# Patient Record
Sex: Male | Born: 1950 | Race: White | Hispanic: No | Marital: Married | Smoking: Never smoker
Health system: Southern US, Community
[De-identification: ages and names within clinical notes are randomized; demographics above are authoritative.]

## PROBLEM LIST (undated history)

## (undated) DIAGNOSIS — E785 Hyperlipidemia, unspecified: Secondary | ICD-10-CM

## (undated) DIAGNOSIS — F411 Generalized anxiety disorder: Secondary | ICD-10-CM

## (undated) DIAGNOSIS — I10 Essential (primary) hypertension: Secondary | ICD-10-CM

## (undated) DIAGNOSIS — G4730 Sleep apnea, unspecified: Secondary | ICD-10-CM

## (undated) DIAGNOSIS — M1990 Unspecified osteoarthritis, unspecified site: Secondary | ICD-10-CM

## (undated) HISTORY — DX: Essential (primary) hypertension: I10

## (undated) HISTORY — PX: CATARACT EXTRACTION: SUR2

## (undated) HISTORY — DX: Hyperlipidemia, unspecified: E78.5

## (undated) HISTORY — DX: Generalized anxiety disorder: F41.1

## (undated) HISTORY — PX: HERNIA REPAIR: SHX51

## (undated) HISTORY — DX: Unspecified osteoarthritis, unspecified site: M19.90

## (undated) HISTORY — DX: Sleep apnea, unspecified: G47.30

## (undated) HISTORY — PX: POLYPECTOMY: SHX149

---

## 1998-07-05 ENCOUNTER — Ambulatory Visit (HOSPITAL_BASED_OUTPATIENT_CLINIC_OR_DEPARTMENT_OTHER): Admission: RE | Admit: 1998-07-05 | Discharge: 1998-07-05 | Payer: Self-pay | Admitting: *Deleted

## 2004-08-10 ENCOUNTER — Ambulatory Visit: Payer: Self-pay | Admitting: Internal Medicine

## 2004-08-28 ENCOUNTER — Ambulatory Visit: Payer: Self-pay | Admitting: Internal Medicine

## 2004-10-11 ENCOUNTER — Ambulatory Visit: Payer: Self-pay | Admitting: Internal Medicine

## 2005-02-02 ENCOUNTER — Ambulatory Visit: Payer: Self-pay | Admitting: Internal Medicine

## 2005-03-02 ENCOUNTER — Ambulatory Visit: Payer: Self-pay | Admitting: Internal Medicine

## 2005-05-09 ENCOUNTER — Ambulatory Visit: Payer: Self-pay | Admitting: Internal Medicine

## 2005-05-24 ENCOUNTER — Ambulatory Visit (HOSPITAL_BASED_OUTPATIENT_CLINIC_OR_DEPARTMENT_OTHER): Admission: RE | Admit: 2005-05-24 | Discharge: 2005-05-24 | Payer: Self-pay | Admitting: Internal Medicine

## 2005-05-26 ENCOUNTER — Ambulatory Visit: Payer: Self-pay | Admitting: Pulmonary Disease

## 2005-07-10 ENCOUNTER — Ambulatory Visit: Payer: Self-pay | Admitting: Internal Medicine

## 2005-08-15 ENCOUNTER — Ambulatory Visit: Payer: Self-pay | Admitting: Internal Medicine

## 2005-09-18 ENCOUNTER — Ambulatory Visit: Payer: Self-pay | Admitting: Internal Medicine

## 2005-10-16 ENCOUNTER — Ambulatory Visit: Payer: Self-pay | Admitting: Internal Medicine

## 2005-12-21 ENCOUNTER — Ambulatory Visit: Payer: Self-pay | Admitting: Internal Medicine

## 2006-01-07 ENCOUNTER — Ambulatory Visit (HOSPITAL_COMMUNITY): Admission: RE | Admit: 2006-01-07 | Discharge: 2006-01-07 | Payer: Self-pay | Admitting: Orthopedic Surgery

## 2006-02-27 ENCOUNTER — Ambulatory Visit: Payer: Self-pay | Admitting: Internal Medicine

## 2006-02-27 LAB — CONVERTED CEMR LAB
AST: 20 units/L (ref 0–37)
Albumin: 4 g/dL (ref 3.5–5.2)
Basophils Absolute: 0 10*3/uL (ref 0.0–0.1)
Chloride: 106 meq/L (ref 96–112)
Creatinine, Ser: 1.2 mg/dL (ref 0.4–1.5)
GFR calc non Af Amer: 66 mL/min
Glomerular Filtration Rate, Af Am: 80 mL/min/{1.73_m2}
Glucose, Bld: 92 mg/dL (ref 70–99)
HDL: 36.9 mg/dL — ABNORMAL LOW (ref >39.0)
LDL Cholesterol: 144 mg/dL — ABNORMAL HIGH (ref 0–99)
MCHC: 33.9 g/dL (ref 30.0–36.0)
MCV: 96.6 fL (ref 78.0–100.0)
Monocytes Absolute: 0.5 10*3/uL (ref 0.2–0.7)
Monocytes Relative: 8.9 % (ref 3.0–11.0)
Neutro Abs: 3.7 10*3/uL (ref 1.4–7.7)
PSA: 2.32 ng/mL (ref 0.10–4.00)
Platelets: 292 10*3/uL (ref 150–400)
RBC: 4.62 M/uL (ref 4.22–5.81)
RDW: 12.6 % (ref 11.5–14.6)
Sodium: 139 meq/L (ref 135–145)
Total Bilirubin: 0.8 mg/dL (ref 0.3–1.2)
Triglyceride fasting, serum: 84 mg/dL (ref 0–149)
VLDL: 17 mg/dL (ref 0–40)

## 2006-03-06 ENCOUNTER — Ambulatory Visit: Payer: Self-pay | Admitting: Internal Medicine

## 2006-03-26 HISTORY — PX: KNEE ARTHROSCOPY: SUR90

## 2006-03-29 ENCOUNTER — Ambulatory Visit: Payer: Self-pay | Admitting: Gastroenterology

## 2006-04-12 ENCOUNTER — Ambulatory Visit: Payer: Self-pay | Admitting: Gastroenterology

## 2006-05-15 ENCOUNTER — Ambulatory Visit: Payer: Self-pay | Admitting: Internal Medicine

## 2006-10-03 ENCOUNTER — Ambulatory Visit: Payer: Self-pay | Admitting: Internal Medicine

## 2006-11-07 DIAGNOSIS — M1990 Unspecified osteoarthritis, unspecified site: Secondary | ICD-10-CM

## 2006-11-07 DIAGNOSIS — M1711 Unilateral primary osteoarthritis, right knee: Secondary | ICD-10-CM

## 2006-11-07 DIAGNOSIS — I10 Essential (primary) hypertension: Secondary | ICD-10-CM

## 2006-11-07 DIAGNOSIS — G4700 Insomnia, unspecified: Secondary | ICD-10-CM

## 2006-11-07 DIAGNOSIS — F411 Generalized anxiety disorder: Secondary | ICD-10-CM

## 2006-11-07 HISTORY — DX: Essential (primary) hypertension: I10

## 2006-11-07 HISTORY — DX: Generalized anxiety disorder: F41.1

## 2006-11-07 HISTORY — DX: Unspecified osteoarthritis, unspecified site: M19.90

## 2006-12-26 ENCOUNTER — Telehealth: Payer: Self-pay | Admitting: *Deleted

## 2007-03-28 ENCOUNTER — Ambulatory Visit: Payer: Self-pay | Admitting: Internal Medicine

## 2007-03-28 LAB — CONVERTED CEMR LAB
ALT: 32 units/L (ref 0–53)
AST: 26 units/L (ref 0–37)
Basophils Relative: 0.8 % (ref 0.0–1.0)
Bilirubin, Direct: 0.2 mg/dL (ref 0.0–0.3)
CO2: 28 meq/L (ref 19–32)
Calcium: 9.1 mg/dL (ref 8.4–10.5)
Chloride: 105 meq/L (ref 96–112)
Eosinophils Relative: 3.9 % (ref 0.0–5.0)
Glucose, Bld: 81 mg/dL (ref 70–99)
HCT: 46.2 % (ref 39.0–52.0)
HDL: 34.6 mg/dL — ABNORMAL LOW (ref >39.0)
Lymphocytes Relative: 22.8 % (ref 12.0–46.0)
Neutro Abs: 4.8 10*3/uL (ref 1.4–7.7)
Neutrophils Relative %: 63.6 % (ref 43.0–77.0)
Nitrite: NEGATIVE
Platelets: 266 10*3/uL (ref 150–400)
Protein, U semiquant: NEGATIVE
RBC: 4.73 M/uL (ref 4.22–5.81)
Total Protein: 7 g/dL (ref 6.0–8.3)
Urobilinogen, UA: 0.2
VLDL: 30 mg/dL (ref 0–40)
WBC Urine, dipstick: NEGATIVE
WBC: 7.6 10*3/uL (ref 4.5–10.5)

## 2007-04-04 ENCOUNTER — Ambulatory Visit: Payer: Self-pay | Admitting: Internal Medicine

## 2007-07-31 ENCOUNTER — Ambulatory Visit: Payer: Self-pay | Admitting: Internal Medicine

## 2007-07-31 DIAGNOSIS — E785 Hyperlipidemia, unspecified: Secondary | ICD-10-CM

## 2007-07-31 HISTORY — DX: Hyperlipidemia, unspecified: E78.5

## 2008-03-15 ENCOUNTER — Ambulatory Visit: Payer: Self-pay | Admitting: Internal Medicine

## 2008-03-15 LAB — CONVERTED CEMR LAB
ALT: 22 units/L (ref 0–53)
AST: 17 units/L (ref 0–37)
Basophils Absolute: 0 10*3/uL (ref 0.0–0.1)
Basophils Relative: 0.1 % (ref 0.0–3.0)
Blood in Urine, dipstick: NEGATIVE
CO2: 29 meq/L (ref 19–32)
Chloride: 104 meq/L (ref 96–112)
Creatinine, Ser: 1 mg/dL (ref 0.4–1.5)
Direct LDL: 151.7 mg/dL
Eosinophils Relative: 1 % (ref 0.0–5.0)
HDL: 36.9 mg/dL — ABNORMAL LOW (ref >39.0)
Lymphocytes Relative: 8.5 % — ABNORMAL LOW (ref 12.0–46.0)
MCHC: 35 g/dL (ref 30.0–36.0)
Neutrophils Relative %: 84.5 % — ABNORMAL HIGH (ref 43.0–77.0)
Nitrite: NEGATIVE
Protein, U semiquant: NEGATIVE
RBC: 4.7 M/uL (ref 4.22–5.81)
Total Bilirubin: 0.9 mg/dL (ref 0.3–1.2)
Total CHOL/HDL Ratio: 5.6
Urobilinogen, UA: 0.2
VLDL: 20 mg/dL (ref 0–40)
WBC Urine, dipstick: NEGATIVE
WBC: 11.3 10*3/uL — ABNORMAL HIGH (ref 4.5–10.5)

## 2008-03-17 ENCOUNTER — Ambulatory Visit: Payer: Self-pay | Admitting: Internal Medicine

## 2008-05-03 ENCOUNTER — Ambulatory Visit: Payer: Self-pay | Admitting: Internal Medicine

## 2008-05-03 LAB — CONVERTED CEMR LAB
Blood in Urine, dipstick: NEGATIVE
Urobilinogen, UA: 1

## 2008-05-04 ENCOUNTER — Encounter: Payer: Self-pay | Admitting: Internal Medicine

## 2008-07-07 ENCOUNTER — Ambulatory Visit: Payer: Self-pay | Admitting: Internal Medicine

## 2008-07-07 LAB — CONVERTED CEMR LAB
Albumin: 3.8 g/dL (ref 3.5–5.2)
Cholesterol: 184 mg/dL (ref 0–200)
HDL: 34.5 mg/dL — ABNORMAL LOW (ref >39.00)
LDL Cholesterol: 137 mg/dL — ABNORMAL HIGH (ref 0–99)
Total Protein: 7.3 g/dL (ref 6.0–8.3)
Triglycerides: 65 mg/dL (ref 0.0–149.0)
VLDL: 13 mg/dL (ref 0.0–40.0)

## 2008-07-14 ENCOUNTER — Ambulatory Visit: Payer: Self-pay | Admitting: Internal Medicine

## 2008-07-14 DIAGNOSIS — M65849 Other synovitis and tenosynovitis, unspecified hand: Secondary | ICD-10-CM

## 2008-07-14 DIAGNOSIS — M65839 Other synovitis and tenosynovitis, unspecified forearm: Secondary | ICD-10-CM

## 2008-07-14 LAB — CONVERTED CEMR LAB
HDL goal, serum: 40 mg/dL
LDL Goal: 130 mg/dL

## 2008-11-30 ENCOUNTER — Ambulatory Visit: Payer: Self-pay | Admitting: Internal Medicine

## 2008-11-30 LAB — CONVERTED CEMR LAB
ALT: 20 units/L (ref 0–53)
AST: 21 units/L (ref 0–37)
Bilirubin, Direct: 0 mg/dL (ref 0.0–0.3)
Total Bilirubin: 0.9 mg/dL (ref 0.3–1.2)
Total CHOL/HDL Ratio: 5

## 2008-12-07 ENCOUNTER — Ambulatory Visit: Payer: Self-pay | Admitting: Internal Medicine

## 2009-04-19 ENCOUNTER — Ambulatory Visit: Payer: Self-pay | Admitting: Internal Medicine

## 2009-04-19 LAB — CONVERTED CEMR LAB
Albumin: 4 g/dL (ref 3.5–5.2)
Basophils Relative: 0.5 % (ref 0.0–3.0)
Bilirubin Urine: NEGATIVE
Blood in Urine, dipstick: NEGATIVE
CO2: 28 meq/L (ref 19–32)
Chloride: 107 meq/L (ref 96–112)
Cholesterol: 183 mg/dL (ref 0–200)
Creatinine, Ser: 1.1 mg/dL (ref 0.4–1.5)
Eosinophils Absolute: 0.2 10*3/uL (ref 0.0–0.7)
HDL: 36.6 mg/dL — ABNORMAL LOW (ref >39.00)
Hemoglobin: 16.1 g/dL (ref 13.0–17.0)
LDL Cholesterol: 127 mg/dL — ABNORMAL HIGH (ref 0–99)
MCHC: 33.5 g/dL (ref 30.0–36.0)
MCV: 99 fL (ref 78.0–100.0)
Monocytes Absolute: 0.5 10*3/uL (ref 0.1–1.0)
Neutro Abs: 4.2 10*3/uL (ref 1.4–7.7)
PSA: 1.52 ng/mL (ref 0.10–4.00)
Protein, U semiquant: NEGATIVE
RBC: 4.87 M/uL (ref 4.22–5.81)
Sodium: 140 meq/L (ref 135–145)
Total Protein: 7.3 g/dL (ref 6.0–8.3)
Urobilinogen, UA: 0.2
VLDL: 19.2 mg/dL (ref 0.0–40.0)
WBC Urine, dipstick: NEGATIVE

## 2009-04-26 ENCOUNTER — Ambulatory Visit: Payer: Self-pay | Admitting: Internal Medicine

## 2009-07-27 ENCOUNTER — Telehealth: Payer: Self-pay | Admitting: Internal Medicine

## 2009-08-02 ENCOUNTER — Ambulatory Visit: Payer: Self-pay | Admitting: Internal Medicine

## 2009-10-18 ENCOUNTER — Ambulatory Visit: Payer: Self-pay | Admitting: Internal Medicine

## 2009-10-18 LAB — CONVERTED CEMR LAB
ALT: 16 units/L (ref 0–53)
Direct LDL: 154.1 mg/dL
HDL: 36.8 mg/dL — ABNORMAL LOW (ref >39.00)
Total Bilirubin: 1 mg/dL (ref 0.3–1.2)

## 2010-03-01 ENCOUNTER — Ambulatory Visit: Payer: Self-pay | Admitting: Internal Medicine

## 2010-03-02 ENCOUNTER — Encounter: Payer: Self-pay | Admitting: Internal Medicine

## 2010-04-18 ENCOUNTER — Encounter: Payer: Self-pay | Admitting: *Deleted

## 2010-04-18 ENCOUNTER — Ambulatory Visit
Admission: RE | Admit: 2010-04-18 | Discharge: 2010-04-18 | Payer: Self-pay | Source: Home / Self Care | Attending: Internal Medicine | Admitting: Internal Medicine

## 2010-04-18 ENCOUNTER — Other Ambulatory Visit: Payer: Self-pay | Admitting: Internal Medicine

## 2010-04-18 DIAGNOSIS — K4090 Unilateral inguinal hernia, without obstruction or gangrene, not specified as recurrent: Secondary | ICD-10-CM

## 2010-04-18 LAB — CBC WITH DIFFERENTIAL/PLATELET
Basophils Relative: 0.8 % (ref 0.0–3.0)
Eosinophils Relative: 2.7 % (ref 0.0–5.0)
HCT: 48.8 % (ref 39.0–52.0)
Hemoglobin: 16.6 g/dL (ref 13.0–17.0)
Lymphs Abs: 1.9 10*3/uL (ref 0.7–4.0)
MCV: 99.3 fl (ref 78.0–100.0)
Monocytes Absolute: 0.6 10*3/uL (ref 0.1–1.0)
Monocytes Relative: 8.7 % (ref 3.0–12.0)
RBC: 4.91 Mil/uL (ref 4.22–5.81)
WBC: 7 10*3/uL (ref 4.5–10.5)

## 2010-04-18 LAB — TSH: TSH: 1.49 u[IU]/mL (ref 0.35–5.50)

## 2010-04-18 LAB — HEPATIC FUNCTION PANEL
ALT: 25 U/L (ref 0–53)
AST: 22 U/L (ref 0–37)
Bilirubin, Direct: 0.2 mg/dL (ref 0.0–0.3)
Total Bilirubin: 1 mg/dL (ref 0.3–1.2)
Total Protein: 7.9 g/dL (ref 6.0–8.3)

## 2010-04-18 LAB — LIPID PANEL
HDL: 36.6 mg/dL — ABNORMAL LOW (ref >39.00)
Total CHOL/HDL Ratio: 6
VLDL: 18 mg/dL (ref 0.0–40.0)

## 2010-04-18 LAB — BASIC METABOLIC PANEL
Chloride: 104 mEq/L (ref 96–112)
GFR: 73.77 mL/min (ref >60.00)
Potassium: 4.8 mEq/L (ref 3.5–5.1)
Sodium: 140 mEq/L (ref 135–145)

## 2010-04-18 LAB — PSA: PSA: 1.39 ng/mL (ref 0.10–4.00)

## 2010-04-18 NOTE — Assessment & Plan Note (Signed)
Referral to general surgery made today

## 2010-04-18 NOTE — Progress Notes (Signed)
Subjective:     Patient ID: Nathan Young is a 60 y.o. male.  HPI Nathan Young is a 60 year old white male who presents with a one-month history of right inguinal pain. He states that he lifts heavy objects at work and noted pain occurring in his right inguinal area or vomitingabout one month ago. He's had persistent pain in the area with associated nausea and radiation to his back. He states the pain is similar to the pain he experienced when he had a left inguinal hernia in 2001 that hernia required surgical repair by Dr. Lorelee New. He does not complaining of constipation. He is able to pass flatus. He has no fever chills  The following portions of the patient's history were reviewed and updated as appropriate: allergies, current medications, past family history, past medical history, past social history, past surgical history and problem list.  Review of Systems  Constitutional: Negative for fever and fatigue.  HENT: Negative for hearing loss, congestion, neck pain and postnasal drip.   Eyes: Negative for discharge, redness and visual disturbance.  Respiratory: Negative for cough, shortness of breath and wheezing.   Cardiovascular: Negative for leg swelling.  Gastrointestinal: Negative for abdominal pain, constipation and abdominal distention.  Genitourinary: Negative for urgency and frequency.  Musculoskeletal: Negative for joint swelling and arthralgias.  Skin: Negative for color change and rash.  Neurological: Negative for weakness and light-headedness.  Hematological: Negative for adenopathy.  Psychiatric/Behavioral: Negative for behavioral problems.       Objective:   Physical Exam  Constitutional: He is oriented to person, place, and time. He appears well-developed and well-nourished. He appears distressed.  HENT:  Head: Normocephalic.  Right Ear: External ear normal.  Left Ear: External ear normal.  Eyes: EOM are normal. Pupils are equal, round, and reactive to light.   Neck: Normal range of motion.  Cardiovascular: Normal rate and regular rhythm.   Pulmonary/Chest: Effort normal and breath sounds normal.  Abdominal: Soft. He exhibits mass.       Has tenderness in the right inguinal area with palpable mass in canal that is easily reduced  Musculoskeletal: Normal range of motion.  Neurological: He is alert and oriented to person, place, and time.  Skin: Skin is warm and dry.  Psychiatric: He has a normal mood and affect. His behavior is normal.       Assessment:     New right in direct inguinal hernia. History of left inguinal hernia. Stable hypertension.    Plan:       the patient is referred today to Gen. Surgery. He is instructed to avoid lifting weights greater than 10 pounds. He feels that he does not require anything for pain at this time. He is instructed that should he develop constipation persistent pain nausea or vomiting he should present to the emergency room for evaluation. Patient demonstrated knowledge of his condition and understanding of these instructions.

## 2010-04-21 ENCOUNTER — Ambulatory Visit: Admit: 2010-04-21 | Payer: Self-pay | Admitting: Internal Medicine

## 2010-04-25 NOTE — Letter (Signed)
Summary: SHINGLE SHOT WAIVER  SHINGLE SHOT WAIVER   Imported By: Georgian Co 03/02/2010 09:38:41  _____________________________________________________________________  External Attachment:    Type:   Image     Comment:   External Document

## 2010-04-25 NOTE — Assessment & Plan Note (Signed)
Summary: cpx/njr rsc bmp/njr   Vital Signs:  Patient profile:   60 year old male Height:      71 inches Weight:      190 pounds BMI:     26.60 Temp:     98.2 degrees F oral Pulse rate:   72 / minute Resp:     14 per minute BP sitting:   124 / 76  (left arm)  Vitals Entered By: Willy Eddy, LPN (April 26, 2009 11:06 AM) CC: cpx   CC:  cpx.  History of Present Illness: weight increased with less activity  The pt was asked about all immunizations, health maint. services that are appropriate to their age and was given guidance on diet exercize  and weight management   Preventive Screening-Counseling & Management  Alcohol-Tobacco     Smoking Status: never  Problems Prior to Update: 1)  Tendinitis, Left Thumb  (ICD-727.05) 2)  Uti  (ICD-599.0) 3)  Hyperlipidemia  (ICD-272.4) 4)  Physical Examination  (ICD-V70.0) 5)  Anxiety  (ICD-300.00) 6)  Osteoarthritis  (ICD-715.90) 7)  Hypertension  (ICD-401.9)  Current Problems (verified): 1)  Tendinitis, Left Thumb  (ICD-727.05) 2)  Uti  (ICD-599.0) 3)  Hyperlipidemia  (ICD-272.4) 4)  Physical Examination  (ICD-V70.0) 5)  Anxiety  (ICD-300.00) 6)  Osteoarthritis  (ICD-715.90) 7)  Hypertension  (ICD-401.9)  Medications Prior to Update: 1)  Atenolol 100 Mg Tabs (Atenolol) .... 1/2 Once Daily 2)  Diazepam 5 Mg  Tabs (Diazepam) .... 1/2  By Mouth At Bedtime 3)  Seroquel 50 Mg Tabs (Quetiapine Fumarate) .... One By Mouth Q Hs 4)  Cialis 2.5 Mg  Tabs (Tadalafil) .... As Needed 5)  Fish Oil Maximum Strength 1200 Mg Caps (Omega-3 Fatty Acids) .... One By Mouth Bid 6)  Niacin Flush Free 500 Mg Caps (Inositol Niacinate) .... One By Mouth Daily  Current Medications (verified): 1)  Atenolol 100 Mg Tabs (Atenolol) .... 1/2 Once Daily 2)  Diazepam 5 Mg  Tabs (Diazepam) .... 1/2  By Mouth At Bedtime 3)  Seroquel 50 Mg Tabs (Quetiapine Fumarate) .... 1/2 At Bedtime ' 4)  Cialis 2.5 Mg  Tabs (Tadalafil) .... As Needed 5)  Fish  Oil Maximum Strength 1200 Mg Caps (Omega-3 Fatty Acids) .... One By Mouth Bid 6)  Niacin Flush Free 500 Mg Caps (Inositol Niacinate) .... One By Mouth Daily  Allergies (verified): No Known Drug Allergies  Past History:  Social History: Last updated: 11/07/2006 Occupation: Married Former Smoker Alcohol use-no Drug use-no  Risk Factors: Smoking Status: never (04/26/2009)  Past medical, surgical, family and social histories (including risk factors) reviewed, and no changes noted (except as noted below).  Past Medical History: Reviewed history from 07/31/2007 and no changes required. Sleep Apnea Hypertension Insomnia Osteoarthritis Anxiety Hyperlipidemia  Past Surgical History: Reviewed history from 11/07/2006 and no changes required. Colonoscopy  Family History: Reviewed history and no changes required.  Social History: Reviewed history from 11/07/2006 and no changes required. Occupation: Married Former Smoker Alcohol use-no Drug use-no Smoking Status:  never  Review of Systems  The patient denies anorexia, fever, weight loss, weight gain, vision loss, decreased hearing, hoarseness, chest pain, syncope, dyspnea on exertion, peripheral edema, prolonged cough, headaches, hemoptysis, abdominal pain, melena, hematochezia, severe indigestion/heartburn, hematuria, incontinence, genital sores, muscle weakness, suspicious skin lesions, transient blindness, difficulty walking, depression, unusual weight change, abnormal bleeding, enlarged lymph nodes, angioedema, and breast masses.    Physical Exam  General:  Well-developed,well-nourished,in no acute distress; alert,appropriate and cooperative  throughout examination Head:  normocephalic and atraumatic.   Eyes:  pupils equal and pupils round.   Nose:  External nasal examination shows no deformity or inflammation. Nasal mucosa are pink and moist without lesions or exudates. Mouth:  Oral mucosa and oropharynx without lesions  or exudates.  Teeth in good repair. Neck:  No deformities, masses, or tenderness noted. Lungs:  Normal respiratory effort, chest expands symmetrically. Lungs are clear to auscultation, no crackles or wheezes. Heart:  Normal rate and regular rhythm. S1 and S2 normal without gallop, murmur, click, rub or other extra sounds. Abdomen:  Bowel sounds positive,abdomen soft and non-tender without masses, organomegaly or hernias noted. Msk:  No deformity or scoliosis noted of thoracic or lumbar spine.   Extremities:  No clubbing, cyanosis, edema, or deformity noted with normal full range of motion of all joints.   Neurologic:  No cranial nerve deficits noted. Station and gait are normal. Plantar reflexes are down-going bilaterally. DTRs are symmetrical throughout. Sensory, motor and coordinative functions appear intact.   Impression & Recommendations:  Problem # 1:  PHYSICAL EXAMINATION (ICD-V70.0) The pt was asked about all immunizations, health maint. services that are appropriate to their age and was given guidance on diet exercize  and weight management  Colonoscopy: Normal (04/28/2007) Td Booster: Historical (03/26/2000)   Flu Vax: Historical (01/27/2009)   Chol: 183 (04/19/2009)   HDL: 36.60 (04/19/2009)   LDL: 127 (04/19/2009)   TG: 96.0 (04/19/2009) TSH: 1.97 (04/19/2009)   PSA: 1.52 (04/19/2009) Next Colonoscopy due:: 04/2016 (04/26/2009)  Discussed using sunscreen, use of alcohol, drug use, self testicular exam, routine dental care, routine eye care, routine physical exam, seat belts, multiple vitamins, osteoporosis prevention, adequate calcium intake in diet, and recommendations for immunizations.  Discussed exercise and checking cholesterol.  Discussed gun safety, safe sex, and contraception. Also recommend checking PSA.  Problem # 2:  HYPERTENSION (ICD-401.9)  His updated medication list for this problem includes:    Atenolol 100 Mg Tabs (Atenolol) .Marland Kitchen... 1/2 once daily  BP today:  124/76 Prior BP: 116/74 (12/07/2008)  Prior 10 Yr Risk Heart Disease: 18 % (12/07/2008)  Labs Reviewed: K+: 4.6 (04/19/2009) Creat: : 1.1 (04/19/2009)   Chol: 183 (04/19/2009)   HDL: 36.60 (04/19/2009)   LDL: 127 (04/19/2009)   TG: 96.0 (04/19/2009)  Complete Medication List: 1)  Atenolol 100 Mg Tabs (Atenolol) .... 1/2 once daily 2)  Diazepam 5 Mg Tabs (Diazepam) .... 1/2  by mouth at bedtime 3)  Seroquel 50 Mg Tabs (Quetiapine fumarate) .... 1/2 at bedtime ' 4)  Cialis 2.5 Mg Tabs (Tadalafil) .... As needed 5)  Fish Oil Maximum Strength 1200 Mg Caps (Omega-3 fatty acids) .... One by mouth bid 6)  Niacin Flush Free 500 Mg Caps (Inositol niacinate) .... One by mouth daily  Patient Instructions: 1)  Please schedule a follow-up appointment in 6 months. Prescriptions: SEROQUEL 50 MG TABS (QUETIAPINE FUMARATE) 1/2 at bedtime '  #90 x 3   Entered and Authorized by:   Stacie Glaze MD   Signed by:   Stacie Glaze MD on 04/26/2009   Method used:   Electronically to        [facility]* (mail-order)             ,          Ph: [phone]       Fax: [phone]   RxID:   [id number] ATENOLOL 100 MG TABS (ATENOLOL) 1/2 once daily  #90 x 3  Entered and Authorized by:   Stacie Glaze MD   Signed by:   Stacie Glaze MD on 04/26/2009   Method used:   Electronically to        [facility]* (mail-order)             ,          Ph: [phone]       Fax: [phone]   RxID:   [id number]    Immunization History:  Influenza Immunization History:    Influenza:  historical (01/27/2009)    Preventive Care Screening  Colonoscopy:    Next Due:  04/2016

## 2010-04-25 NOTE — Assessment & Plan Note (Signed)
Summary: HTN CONCERNS // RS   Vital Signs:  Patient profile:   60 year old male Height:      71 inches Weight:      185 pounds BMI:     25.90 Temp:     98.2 degrees F oral Pulse rate:   76 / minute Pulse rhythm:   regular Resp:     14 per minute BP sitting:   120 / 80  (left arm)  Vitals Entered By: Willy Eddy, LPN (Aug 02, 2009 10:05 AM) CC: c/o elevated bp reading at various places,primarily elevated diastolic   CC:  c/o elevated bp reading at various places and primarily elevated diastolic.  History of Present Illness:  Hypertension Follow-Up      This is a 60 year old man who presents for Hypertension follow-up.  The patient denies lightheadedness, urinary frequency, headaches, edema, impotence, rash, and fatigue.  Associated symptoms include chest pain.  The patient denies the following associated symptoms: chest pressure, exercise intolerance, dyspnea, palpitations, syncope, leg edema, and pedal edema.  Compliance with medications (by patient report) has been near 100%.  The patient reports that dietary compliance has been fair.  The patient reports exercising occasionally.    Preventive Screening-Counseling & Management  Alcohol-Tobacco     Smoking Status: never  Problems Prior to Update: 1)  Tendinitis, Left Thumb  (ICD-727.05) 2)  Uti  (ICD-599.0) 3)  Hyperlipidemia  (ICD-272.4) 4)  Physical Examination  (ICD-V70.0) 5)  Anxiety  (ICD-300.00) 6)  Osteoarthritis  (ICD-715.90) 7)  Hypertension  (ICD-401.9)  Current Problems (verified): 1)  Tendinitis, Left Thumb  (ICD-727.05) 2)  Uti  (ICD-599.0) 3)  Hyperlipidemia  (ICD-272.4) 4)  Physical Examination  (ICD-V70.0) 5)  Anxiety  (ICD-300.00) 6)  Osteoarthritis  (ICD-715.90) 7)  Hypertension  (ICD-401.9)  Medications Prior to Update: 1)  Atenolol 100 Mg Tabs (Atenolol) .... 1/2 Once Daily 2)  Diazepam 5 Mg  Tabs (Diazepam) .... 1/2  By Mouth At Bedtime 3)  Seroquel 50 Mg Tabs (Quetiapine Fumarate) ....  1/2 At Bedtime ' 4)  Cialis 2.5 Mg  Tabs (Tadalafil) .... As Needed 5)  Fish Oil Maximum Strength 1200 Mg Caps (Omega-3 Fatty Acids) .... One By Mouth Bid 6)  Niacin Flush Free 500 Mg Caps (Inositol Niacinate) .... One By Mouth Daily  Current Medications (verified): 1)  Atenolol 100 Mg Tabs (Atenolol) .... 1/2 Once Daily 2)  Diazepam 5 Mg  Tabs (Diazepam) .... 1/2  By Mouth At Bedtime 3)  Seroquel 50 Mg Tabs (Quetiapine Fumarate) .... 1/2 At Bedtime ' 4)  Cialis 2.5 Mg  Tabs (Tadalafil) .... As Needed 5)  Fish Oil Maximum Strength 1200 Mg Caps (Omega-3 Fatty Acids) .... One By Mouth Bid 6)  Niacin Flush Free 500 Mg Caps (Inositol Niacinate) .... One By Mouth Daily  Allergies (verified): No Known Drug Allergies  Past History:  Social History: Last updated: 11/07/2006 Occupation: Married Former Smoker Alcohol use-no Drug use-no  Risk Factors: Smoking Status: never (08/02/2009)  Past medical, surgical, family and social histories (including risk factors) reviewed, and no changes noted (except as noted below).  Past Medical History: Reviewed history from 07/31/2007 and no changes required. Sleep Apnea Hypertension Insomnia Osteoarthritis Anxiety Hyperlipidemia  Past Surgical History: Reviewed history from 11/07/2006 and no changes required. Colonoscopy  Family History: Reviewed history and no changes required.  Social History: Reviewed history from 11/07/2006 and no changes required. Occupation: Married Former Smoker Alcohol use-no Drug use-no  Review of Systems  The  patient denies anorexia, fever, weight loss, weight gain, vision loss, decreased hearing, hoarseness, chest pain, syncope, dyspnea on exertion, peripheral edema, prolonged cough, headaches, hemoptysis, abdominal pain, melena, hematochezia, severe indigestion/heartburn, hematuria, incontinence, genital sores, muscle weakness, suspicious skin lesions, transient blindness, difficulty walking, depression,  unusual weight change, abnormal bleeding, enlarged lymph nodes, angioedema, and breast masses.    Physical Exam  General:  Well-developed,well-nourished,in no acute distress; alert,appropriate and cooperative throughout examination Head:  normocephalic and atraumatic.   Eyes:  pupils equal and pupils round.   Nose:  External nasal examination shows no deformity or inflammation. Nasal mucosa are pink and moist without lesions or exudates. Mouth:  Oral mucosa and oropharynx without lesions or exudates.  Teeth in good repair. Neck:  No deformities, masses, or tenderness noted. Lungs:  Normal respiratory effort, chest expands symmetrically. Lungs are clear to auscultation, no crackles or wheezes. Heart:  Normal rate and regular rhythm. S1 and S2 normal without gallop, murmur, click, rub or other extra sounds. Abdomen:  Bowel sounds positive,abdomen soft and non-tender without masses, organomegaly or hernias noted. Msk:  No deformity or scoliosis noted of thoracic or lumbar spine.   Extremities:  No clubbing, cyanosis, edema, or deformity noted with normal full range of motion of all joints.   Neurologic:  No cranial nerve deficits noted. Station and gait are normal. Plantar reflexes are down-going bilaterally. DTRs are symmetrical throughout. Sensory, motor and coordinative functions appear intact.   Impression & Recommendations:  Problem # 1:  HYPERLIPIDEMIA (ICD-272.4)  Labs Reviewed: SGOT: 24 (04/19/2009)   SGPT: 22 (04/19/2009)  Lipid Goals: Chol Goal: 200 (07/14/2008)   HDL Goal: 40 (07/14/2008)   LDL Goal: 130 (07/14/2008)   TG Goal: 150 (07/14/2008)  Prior 10 Yr Risk Heart Disease: 18 % (12/07/2008)   HDL:36.60 (04/19/2009), 30.90 (11/30/2008)  LDL:127 (04/19/2009), 117 (11/30/2008)  Chol:183 (04/19/2009), 167 (11/30/2008)  Trig:96.0 (04/19/2009), 95.0 (11/30/2008)  Problem # 2:  HYPERTENSION (ICD-401.9) pt has a hx of anxiety and has been noted increased blood pressure to the  140/90 and 140/90 we discussed  salt limitation and stress reduction has been working very hard and has been up and down on the ladder  His updated medication list for this problem includes:    Atenolol 100 Mg Tabs (Atenolol) .Marland Kitchen... 1/2 once daily  BP today: 120/80 Prior BP: 124/76 (04/26/2009)  Prior 10 Yr Risk Heart Disease: 18 % (12/07/2008)  Labs Reviewed: K+: 4.6 (04/19/2009) Creat: : 1.1 (04/19/2009)   Chol: 183 (04/19/2009)   HDL: 36.60 (04/19/2009)   LDL: 127 (04/19/2009)   TG: 96.0 (04/19/2009)  Problem # 3:  OSTEOARTHRITIS (ICD-715.90)  discussion of using two aleve at night due to the pain interferring with sleep  Discussed use of medications, application of heat or cold, and exercises.   Complete Medication List: 1)  Atenolol 100 Mg Tabs (Atenolol) .... 1/2 once daily 2)  Diazepam 5 Mg Tabs (Diazepam) .... 1/2  by mouth at bedtime 3)  Seroquel 50 Mg Tabs (Quetiapine fumarate) .... 1/2 at bedtime ' 4)  Cialis 2.5 Mg Tabs (Tadalafil) .... As needed 5)  Fish Oil Maximum Strength 1200 Mg Caps (Omega-3 fatty acids) .... One by mouth bid 6)  Niacin Flush Free 500 Mg Caps (Inositol niacinate) .... One by mouth daily  Patient Instructions: 1)  keep appointment

## 2010-04-25 NOTE — Progress Notes (Signed)
Summary: rx call into pharm  Phone Note Call from Patient Call back at Home Phone [phone]   Caller: Patient Call For: Stacie Glaze MD Summary of Call: pt needs new rx to [facility] [address] atenolol 100mg  and seroquel 50 mg Initial call taken by: Heron Sabins,  Jul 27, 2009 12:35 PM    Prescriptions: ATENOLOL 100 MG TABS (ATENOLOL) 1/2 once daily  #90 x 3   Entered by:   Willy Eddy, LPN   Authorized by:   Stacie Glaze MD   Signed by:   Willy Eddy, LPN on 81/19/1478   Method used:   Electronically to        [facility]  [address] [phone]* (retail)       [address]       [place], [place]  [address]       Ph: [phone]       Fax: [phone]   RxID:   [id number] SEROQUEL 50 MG TABS (QUETIAPINE FUMARATE) 1/2 at bedtime '  #90 x 3   Entered by:   Willy Eddy, LPN   Authorized by:   Stacie Glaze MD   Signed by:   Willy Eddy, LPN on 25/95/6387   Method used:   [facility] to        [facility]  [address] [phone]* (retail)       [address]       [place], [place]  [address]       Ph: [phone]       Fax: [phone]   RxID:   [id number]

## 2010-04-25 NOTE — Assessment & Plan Note (Signed)
Summary: 6 MONTH ROV/NJR---PT RSC (BMP) // RS   Vital Signs:  Patient profile:   60 year old male Height:      71 inches Weight:      186 pounds BMI:     26.04 Temp:     98.2 degrees F oral Pulse rate:   72 / minute Resp:     14 per minute BP sitting:   114 / 70  (left arm)  Vitals Entered By: Willy Eddy, LPN (October 18, 2009 9:39 AM) CC: ROA- FASTING THIS AM, Hypertension Management Is Patient Diabetic? No   Primary Care Provider:  Stacie Glaze MD  CC:  ROA- FASTING THIS AM and Hypertension Management.  History of Present Illness: follow up for HTN and lipids less palpitations with decreased salt intake   Hypertension History:      He denies headache, chest pain, palpitations, dyspnea with exertion, orthopnea, PND, peripheral edema, visual symptoms, neurologic problems, syncope, and side effects from treatment.  feeling OK layed off salt!.        Positive major cardiovascular risk factors include male age 51 years old or older, hyperlipidemia, and hypertension.  Negative major cardiovascular risk factors include non-tobacco-user status.        Further assessment for target organ damage reveals no history of ASHD, stroke/TIA, or peripheral vascular disease.     Preventive Screening-Counseling & Management  Alcohol-Tobacco     Smoking Status: never  Problems Prior to Update: 1)  Tendinitis, Left Thumb  (ICD-727.05) 2)  Uti  (ICD-599.0) 3)  Hyperlipidemia  (ICD-272.4) 4)  Physical Examination  (ICD-V70.0) 5)  Anxiety  (ICD-300.00) 6)  Osteoarthritis  (ICD-715.90) 7)  Hypertension  (ICD-401.9)  Current Problems (verified): 1)  Tendinitis, Left Thumb  (ICD-727.05) 2)  Uti  (ICD-599.0) 3)  Hyperlipidemia  (ICD-272.4) 4)  Physical Examination  (ICD-V70.0) 5)  Anxiety  (ICD-300.00) 6)  Osteoarthritis  (ICD-715.90) 7)  Hypertension  (ICD-401.9)  Medications Prior to Update: 1)  Atenolol 100 Mg Tabs (Atenolol) .... 1/2 Once Daily 2)  Diazepam 5 Mg  Tabs  (Diazepam) .... 1/2  By Mouth At Bedtime 3)  Seroquel 50 Mg Tabs (Quetiapine Fumarate) .... 1/2 At Bedtime ' 4)  Cialis 2.5 Mg  Tabs (Tadalafil) .... As Needed 5)  Fish Oil Maximum Strength 1200 Mg Caps (Omega-3 Fatty Acids) .... One By Mouth Bid 6)  Niacin Flush Free 500 Mg Caps (Inositol Niacinate) .... One By Mouth Daily  Current Medications (verified): 1)  Atenolol 100 Mg Tabs (Atenolol) .... 1/2 Once Daily 2)  Diazepam 5 Mg  Tabs (Diazepam) .... 1/2  By Mouth At Bedtime 3)  Seroquel 50 Mg Tabs (Quetiapine Fumarate) .... 1/2 At Bedtime ' 4)  Cialis 2.5 Mg  Tabs (Tadalafil) .... As Needed 5)  Fish Oil Maximum Strength 1200 Mg Caps (Omega-3 Fatty Acids) .... One By Mouth Bid 6)  Niacin Flush Free 500 Mg Caps (Inositol Niacinate) .... One By Mouth Daily  Allergies (verified): No Known Drug Allergies  Past History:  Social History: Last updated: 11/07/2006 Occupation: Married Former Smoker Alcohol use-no Drug use-no  Risk Factors: Smoking Status: never (10/18/2009)  Past medical, surgical, family and social histories (including risk factors) reviewed, and no changes noted (except as noted below).  Past Medical History: Reviewed history from 07/31/2007 and no changes required. Sleep Apnea Hypertension Insomnia Osteoarthritis Anxiety Hyperlipidemia  Past Surgical History: Reviewed history from 11/07/2006 and no changes required. Colonoscopy  Family History: Reviewed history and no changes required.  Social History: Reviewed history from 11/07/2006 and no changes required. Occupation: Married Former Smoker Alcohol use-no Drug use-no  Review of Systems  The patient denies anorexia, fever, weight loss, weight gain, vision loss, decreased hearing, hoarseness, chest pain, syncope, dyspnea on exertion, peripheral edema, prolonged cough, headaches, hemoptysis, abdominal pain, melena, hematochezia, severe indigestion/heartburn, hematuria, incontinence, genital sores,  muscle weakness, suspicious skin lesions, transient blindness, difficulty walking, depression, unusual weight change, abnormal bleeding, enlarged lymph nodes, angioedema, and breast masses.    Physical Exam  General:  Well-developed,well-nourished,in no acute distress; alert,appropriate and cooperative throughout examination Head:  normocephalic and atraumatic.   Eyes:  pupils equal and pupils round.   Nose:  External nasal examination shows no deformity or inflammation. Nasal mucosa are pink and moist without lesions or exudates. Mouth:  Oral mucosa and oropharynx without lesions or exudates.  Teeth in good repair. Neck:  No deformities, masses, or tenderness noted. Lungs:  Normal respiratory effort, chest expands symmetrically. Lungs are clear to auscultation, no crackles or wheezes. Heart:  Normal rate and regular rhythm. S1 and S2 normal without gallop, murmur, click, rub or other extra sounds. Abdomen:  Bowel sounds positive,abdomen soft and non-tender without masses, organomegaly or hernias noted.   Impression & Recommendations:  Problem # 1:  HYPERLIPIDEMIA (ICD-272.4)  Labs Reviewed: SGOT: 24 (04/19/2009)   SGPT: 22 (04/19/2009)  Lipid Goals: Chol Goal: 200 (07/14/2008)   HDL Goal: 40 (07/14/2008)   LDL Goal: 130 (07/14/2008)   TG Goal: 150 (07/14/2008)  10 Yr Risk Heart Disease: 14 % Prior 10 Yr Risk Heart Disease: 18 % (12/07/2008)   HDL:36.60 (04/19/2009), 30.90 (11/30/2008)  LDL:127 (04/19/2009), 117 (11/30/2008)  Chol:183 (04/19/2009), 167 (11/30/2008)  Trig:96.0 (04/19/2009), 95.0 (11/30/2008)  Orders: TLB-Cholesterol, HDL (83718-HDL) TLB-Cholesterol, Direct LDL (83721-DIRLDL) TLB-Cholesterol, Total (82465-CHO)  Problem # 2:  OSTEOARTHRITIS (ICD-715.90)  Discussed use of medications, application of heat or cold, and exercises.   Problem # 3:  HYPERTENSION (ICD-401.9)  His updated medication list for this problem includes:    Atenolol 100 Mg Tabs (Atenolol) .Marland Kitchen...  1/2 once daily  Orders: Venipuncture (34742) TLB-Hepatic/Liver Function Pnl (80076-HEPATIC)  BP today: 114/70 Prior BP: 120/80 (08/02/2009)  10 Yr Risk Heart Disease: 14 % Prior 10 Yr Risk Heart Disease: 18 % (12/07/2008)  Labs Reviewed: K+: 4.6 (04/19/2009) Creat: : 1.1 (04/19/2009)   Chol: 183 (04/19/2009)   HDL: 36.60 (04/19/2009)   LDL: 127 (04/19/2009)   TG: 96.0 (04/19/2009)  Problem # 4:  ANXIETY (ICD-300.00)  His updated medication list for this problem includes:    Diazepam 5 Mg Tabs (Diazepam) .Marland Kitchen... 1/2  by mouth at bedtime  Discussed medication use and relaxation techniques.   Complete Medication List: 1)  Atenolol 100 Mg Tabs (Atenolol) .... 1/2 once daily 2)  Diazepam 5 Mg Tabs (Diazepam) .... 1/2  by mouth at bedtime 3)  Seroquel 50 Mg Tabs (Quetiapine fumarate) .... 1/2 at bedtime ' 4)  Cialis 2.5 Mg Tabs (Tadalafil) .... As needed 5)  Fish Oil Maximum Strength 1200 Mg Caps (Omega-3 fatty acids) .... One by mouth bid 6)  Niacin Flush Free 500 Mg Caps (Inositol niacinate) .... One by mouth daily  Hypertension Assessment/Plan:      The patient's hypertensive risk group is category B: At least one risk factor (excluding diabetes) with no target organ damage.  His calculated 10 year risk of coronary heart disease is 14 %.  Today's blood pressure is 114/70.  His blood pressure goal is < 140/90.  Patient Instructions:  1)  JAN cpx  with labs

## 2010-04-25 NOTE — Assessment & Plan Note (Signed)
Summary: flu shot//Shingle Shot//alp   Nurse Visit   Review of Systems       Flu Vaccine Consent Questions     Do you have a history of severe allergic reactions to this vaccine? no    Any prior history of allergic reactions to egg and/or gelatin? no    Do you have a sensitivity to the preservative Thimersol? no    Do you have a past history of Guillan-Barre Syndrome? no    Do you currently have an acute febrile illness? no    Have you ever had a severe reaction to latex? no    Vaccine information given and explained to patient? yes    Are you currently pregnant? no    Lot Number:[id number]   Exp Date:09/23/2010   Site Given  Left Deltoid IM    Allergies: No Known Drug Allergies  Immunizations Administered:  Zostavax # 1:    Vaccine Type: Zostavax    Site: right deltoid    Mfr: Merck    Dose: 0.5 ml    Route: Shiloh    Given by: Willy Eddy, LPN    Exp. Date: 11/11/2010    Lot #: [id number]    VIS given: 01/05/05 given March 01, 2010.  Orders Added: 1)  Admin 1st Vaccine [90471] 2)  Flu Vaccine 59yrs + [81191] 3)  Zoster (Shingles) Vaccine Live [90736] 4)  Admin of Any Addtl Vaccine [47829]

## 2010-04-27 NOTE — Assessment & Plan Note (Signed)
Summary: hernia/bmw   Vital Signs:  Patient profile:   60 year old male Height:      71 inches Weight:      190 pounds BMI:     26.60 Temp:     98.2 degrees F oral Pulse rate:   72 / minute Resp:     14 per minute BP sitting:   130 / 80  (left arm)  Vitals Entered By: Willy Eddy, LPN (April 18, 2010 12:27 PM) CC: c/o rt inguinal hernia, Hypertension Management Is Patient Diabetic? No   Primary Care Provider:  Stacie Glaze MD  CC:  c/o rt inguinal hernia and Hypertension Management.  History of Present Illness: the pt has right hernia which is increased and is easily reducible on exam. The pt has a hx of left hernia and repair. The hernia on the left was repair by [facility] in 2001 HTN stable the pain increased caused mild nausea and increased gas he has noted the pain for over 2 months  Hypertension History:      He denies headache, chest pain, palpitations, dyspnea with exertion, orthopnea, PND, peripheral edema, visual symptoms, neurologic problems, syncope, and side effects from treatment.        Positive major cardiovascular risk factors include male age 54 years old or older, hyperlipidemia, and hypertension.  Negative major cardiovascular risk factors include non-tobacco-user status.        Further assessment for target organ damage reveals no history of ASHD, stroke/TIA, or peripheral vascular disease.     Preventive Screening-Counseling & Management  Alcohol-Tobacco     Smoking Status: never     Tobacco Counseling: not indicated; no tobacco use  Problems Prior to Update: 1)  Tendinitis, Left Thumb  (ICD-727.05) 2)  Uti  (ICD-599.0) 3)  Hyperlipidemia  (ICD-272.4) 4)  Physical Examination  (ICD-V70.0) 5)  Anxiety  (ICD-300.00) 6)  Osteoarthritis  (ICD-715.90) 7)  Hypertension  (ICD-401.9)  Current Problems (verified): 1)  Tendinitis, Left Thumb  (ICD-727.05) 2)  Uti  (ICD-599.0) 3)  Hyperlipidemia  (ICD-272.4) 4)  Physical Examination   (ICD-V70.0) 5)  Anxiety  (ICD-300.00) 6)  Osteoarthritis  (ICD-715.90) 7)  Hypertension  (ICD-401.9)  Medications Prior to Update: 1)  Atenolol 100 Mg Tabs (Atenolol) .... 1/2 Once Daily 2)  Diazepam 5 Mg  Tabs (Diazepam) .... 1/2  By Mouth At Bedtime 3)  Seroquel 50 Mg Tabs (Quetiapine Fumarate) .... 1/2 At Bedtime ' 4)  Cialis 2.5 Mg  Tabs (Tadalafil) .... As Needed 5)  Fish Oil Maximum Strength 1200 Mg Caps (Omega-3 Fatty Acids) .... One By Mouth Bid 6)  Niacin Flush Free 500 Mg Caps (Inositol Niacinate) .... One By Mouth Daily  Current Medications (verified): 1)  Atenolol 100 Mg Tabs (Atenolol) .... 1/2 Once Daily 2)  Diazepam 5 Mg  Tabs (Diazepam) .... 1/2  By Mouth At Bedtime 3)  Seroquel 50 Mg Tabs (Quetiapine Fumarate) .... 1/2 At Bedtime ' 4)  Cialis 2.5 Mg  Tabs (Tadalafil) .... As Needed 5)  Fish Oil Maximum Strength 1200 Mg Caps (Omega-3 Fatty Acids) .... One By Mouth Bid 6)  Niacin Flush Free 500 Mg Caps (Inositol Niacinate) .... One By Mouth Daily  Allergies (verified): No Known Drug Allergies  Past History:  Social History: Last updated: 11/07/2006 Occupation: Married Former Smoker Alcohol use-no Drug use-no  Risk Factors: Smoking Status: never (04/18/2010)  Past medical, surgical, family and social histories (including risk factors) reviewed, and no changes noted (except as noted below).  Past Medical History: Reviewed history from 07/31/2007 and no changes required. Sleep Apnea Hypertension Insomnia Osteoarthritis Anxiety Hyperlipidemia  Past Surgical History: Reviewed history from 11/07/2006 and no changes required. Colonoscopy  Family History: Reviewed history and no changes required.  Social History: Reviewed history from 11/07/2006 and no changes required. Occupation: Married Former Smoker Alcohol use-no Drug use-no  Physical Exam  General:  Well-developed,well-nourished,in no acute distress; alert,appropriate and cooperative  throughout examination Head:  normocephalic and atraumatic.   Eyes:  pupils equal and pupils round.   Nose:  External nasal examination shows no deformity or inflammation. Nasal mucosa are pink and moist without lesions or exudates. Neck:  No deformities, masses, or tenderness noted. Lungs:  Normal respiratory effort, chest expands symmetrically. Lungs are clear to auscultation, no crackles or wheezes. Heart:  Normal rate and regular rhythm. S1 and S2 normal without gallop, murmur, click, rub or other extra sounds. Abdomen:  Bowel sounds positive,abdomen soft and non-tender without masses, organomegaly or hernias noted. Genitalia:  right indirect hernia  reducible 4 cm tender and painfull to reduce Prostate:  no gland enlargement and no nodules.   Msk:  No deformity or scoliosis noted of thoracic or lumbar spine.     Impression & Recommendations:  Problem # 1:  HYPERTENSION (ICD-401.9) Assessment Unchanged  His updated medication list for this problem includes:    Atenolol 100 Mg Tabs (Atenolol) .Marland Kitchen... 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 pt has fasted Orders: Venipuncture (57846) TLB-Lipid Panel (80061-LIPID) TLB-BMP (Basic Metabolic Panel-BMET) (80048-METABOL) TLB-CBC Platelet - w/Differential (85025-CBCD) TLB-Hepatic/Liver Function Pnl (80076-HEPATIC) TLB-TSH (Thyroid  Stimulating Hormone) (84443-TSH) TLB-PSA (Prostate Specific Antigen) (84153-PSA) Specimen Handling (96295)  Complete Medication List: 1)  Atenolol 100 Mg Tabs (Atenolol) .... 1/2 once daily 2)  Diazepam 5 Mg Tabs (Diazepam) .... 1/2  by mouth at bedtime 3)  Seroquel 50 Mg Tabs (Quetiapine fumarate) .... 1/2 at bedtime ' 4)  Cialis 2.5 Mg Tabs (Tadalafil) .... As needed 5)  Fish Oil Maximum Strength 1200 Mg Caps (Omega-3 fatty acids) .... One by mouth bid 6)  Niacin Flush Free 500 Mg Caps (Inositol niacinate) .... One by mouth daily  Hypertension Assessment/Plan:      The patient's hypertensive risk group is category B: At least one risk factor (excluding diabetes) with no target organ damage.  His calculated 10 year risk of coronary heart disease is 18 %.  Today's blood pressure is 130/80.  His blood pressure goal is < 140/90.  Patient Instructions: 1)  you have been referred to general surgery   Orders Added: 1)  Est. Patient Level IV [28413] 2)  Surgical Referral [Surgery] 3)  Venipuncture [36415] 4)  TLB-Lipid Panel [80061-LIPID] 5)  TLB-BMP (Basic Metabolic Panel-BMET) [80048-METABOL] 6)  TLB-CBC Platelet - w/Differential [85025-CBCD] 7)  TLB-Hepatic/Liver Function Pnl [80076-HEPATIC] 8)  TLB-TSH (Thyroid Stimulating Hormone) [84443-TSH] 9)  TLB-PSA (Prostate Specific  Antigen) [04540-JWJ] 10)  Specimen Handling [99000]

## 2010-05-08 ENCOUNTER — Encounter (HOSPITAL_COMMUNITY): Payer: 59

## 2010-05-08 ENCOUNTER — Other Ambulatory Visit: Payer: Self-pay | Admitting: Surgery

## 2010-05-08 ENCOUNTER — Other Ambulatory Visit (HOSPITAL_COMMUNITY): Payer: Self-pay | Admitting: Surgery

## 2010-05-08 ENCOUNTER — Ambulatory Visit (HOSPITAL_COMMUNITY)
Admission: RE | Admit: 2010-05-08 | Discharge: 2010-05-08 | Disposition: A | Discharge status: Home / Self Care | Payer: 59 | Source: Ambulatory Visit | Attending: Surgery | Admitting: Surgery

## 2010-05-08 DIAGNOSIS — Z01818 Encounter for other preprocedural examination: Secondary | ICD-10-CM | POA: Insufficient documentation

## 2010-05-08 DIAGNOSIS — Z01812 Encounter for preprocedural laboratory examination: Secondary | ICD-10-CM | POA: Insufficient documentation

## 2010-05-08 DIAGNOSIS — Z01810 Encounter for preprocedural cardiovascular examination: Secondary | ICD-10-CM | POA: Insufficient documentation

## 2010-05-08 LAB — SURGICAL PCR SCREEN
MRSA, PCR: NEGATIVE
Staphylococcus aureus: NEGATIVE

## 2010-05-08 LAB — DIFFERENTIAL
Basophils Absolute: 0 10*3/uL (ref 0.0–0.1)
Eosinophils Absolute: 0.2 10*3/uL (ref 0.0–0.7)
Lymphs Abs: 1.6 10*3/uL (ref 0.7–4.0)
Neutrophils Relative %: 64 % (ref 43–77)

## 2010-05-08 LAB — CBC
MCH: 33.3 pg (ref 26.0–34.0)
Platelets: 250 10*3/uL (ref 150–400)
RBC: 5.04 MIL/uL (ref 4.22–5.81)
WBC: 6.7 10*3/uL (ref 4.0–10.5)

## 2010-05-08 LAB — BASIC METABOLIC PANEL
CO2: 26 mEq/L (ref 19–32)
Calcium: 9.1 mg/dL (ref 8.4–10.5)
GFR calc Af Amer: >60 mL/min (ref >60)
GFR calc non Af Amer: >60 mL/min (ref >60)
Sodium: 140 mEq/L (ref 135–145)

## 2010-05-09 ENCOUNTER — Ambulatory Visit (HOSPITAL_COMMUNITY)
Admission: RE | Admit: 2010-05-09 | Discharge: 2010-05-09 | Disposition: A | Discharge status: Home / Self Care | Payer: 59 | Source: Ambulatory Visit | Attending: Surgery | Admitting: Surgery

## 2010-05-09 DIAGNOSIS — K4090 Unilateral inguinal hernia, without obstruction or gangrene, not specified as recurrent: Secondary | ICD-10-CM | POA: Insufficient documentation

## 2010-05-09 DIAGNOSIS — Z91199 Patient's noncompliance with other medical treatment and regimen due to unspecified reason: Secondary | ICD-10-CM | POA: Insufficient documentation

## 2010-05-09 DIAGNOSIS — G4733 Obstructive sleep apnea (adult) (pediatric): Secondary | ICD-10-CM | POA: Insufficient documentation

## 2010-05-09 DIAGNOSIS — I10 Essential (primary) hypertension: Secondary | ICD-10-CM | POA: Insufficient documentation

## 2010-05-09 DIAGNOSIS — Z9119 Patient's noncompliance with other medical treatment and regimen: Secondary | ICD-10-CM | POA: Insufficient documentation

## 2010-05-10 NOTE — Op Note (Addendum)
  NAME:  JAXZEN, VANHORN NO.:  [id number]  MEDICAL RECORD NO.:  [id number]           PATIENT TYPE:  O  LOCATION:  XRAY                         FACILITY:  [facility]  PHYSICIAN:  Wilmon Arms. Corliss Skains, M.D. DATE OF BIRTH:  June 22, 1950  DATE OF PROCEDURE:  05/09/2010 DATE OF DISCHARGE:  05/08/2010                              OPERATIVE REPORT   PREOPERATIVE DIAGNOSIS:  Right inguinal hernia.  POSTOPERATIVE DIAGNOSIS:  Right inguinal hernia.  PROCEDURE PERFORMED:  Right inguinal hernia repair with mesh.  SURGEON:  Wilmon Arms. Tyjah Hai, M.D.  ANESTHESIA:  General.  INDICATIONS:  This is a 60 year old male, who is status post left inguinal hernia repair 12 years ago.  He has had asymptomatic right inguinal hernia for some time, but this now has become more uncomfortable.  He presents now for hernia repair.  DESCRIPTION OF PROCEDURE:  The patient was brought to the operating room, placed in the supine position on the operating room table.  After an adequate level of general anesthesia was obtained, the patient's right groin was prepped with Betadine and draped in a sterile fashion. Please note that the patient had shaved his own groin prior to coming to the hospital.  He was not instructed to do this.  A time-out was taken to assure the proper patient, proper procedure.  We infiltrated the area above the right inguinal ligament with 0.25% Marcaine with epinephrine.  An oblique incision was made.  Dissection was carried down into the subcutaneous tissues with cautery.  We dissected down to the external oblique fascia.  The fascia was divided along the direction of its fibers down to the external ring.  We dissected around the spermatic cord.  The floor of the inguinal canal was intact.  The internal ring was fairly loose.  We dissected a fairly large indirect hernia sac free and reduced this back up to the internal ring.  The internal ring was tightened with 2-0 Vicryl.   Ultrapro mesh was cut in a keyhole shape and was secured with 2-0 Prolene beginning at the pubic tubercle.  We then attached this to the internal oblique fascia superiorly and then to the inguinal ligament inferiorly.  The tails of the mesh was sutured together behind the spermatic cord and tucked underneath the external oblique fascia.  The fascia was reapproximated with 2-0 Vicryl.  The 3-0 Vicryl was used to close the subcutaneous tissues and 4-0 Monocryl was used to close the skin.  Steri- Strips and clean dressings were applied.  The patient was then extubated and brought to the recovery in stable condition.  All sponge, instrument and needle counts were correct.     Wilmon Arms. Corliss Skains, M.D.     MKT/MEDQ  D:  05/09/2010  T:  05/09/2010  Job:  [id number]  Electronically Signed by Manus Rudd M.D. on 05/10/2010 08:16:50 AM

## 2010-05-12 ENCOUNTER — Encounter: Payer: Self-pay | Admitting: Internal Medicine

## 2010-08-02 ENCOUNTER — Encounter: Payer: Self-pay | Admitting: Internal Medicine

## 2010-08-02 ENCOUNTER — Ambulatory Visit (INDEPENDENT_AMBULATORY_CARE_PROVIDER_SITE_OTHER): Payer: 59 | Admitting: Internal Medicine

## 2010-08-02 DIAGNOSIS — K4090 Unilateral inguinal hernia, without obstruction or gangrene, not specified as recurrent: Secondary | ICD-10-CM

## 2010-08-02 DIAGNOSIS — E785 Hyperlipidemia, unspecified: Secondary | ICD-10-CM

## 2010-08-02 DIAGNOSIS — I10 Essential (primary) hypertension: Secondary | ICD-10-CM

## 2010-08-02 NOTE — Progress Notes (Signed)
  Subjective:    Patient ID: Nathan Young, male    DOB: 1950/06/17, 60 y.o.   MRN: [id number]  HPI  cpx     Review of Systems  Constitutional: Negative for fever and fatigue.  HENT: Negative for hearing loss, congestion, neck pain and postnasal drip.   Eyes: Negative for discharge, redness and visual disturbance.  Respiratory: Negative for cough, shortness of breath and wheezing.   Cardiovascular: Negative for leg swelling.  Gastrointestinal: Negative for abdominal pain, constipation and abdominal distention.  Genitourinary: Negative for urgency and frequency.  Musculoskeletal: Negative for joint swelling and arthralgias.  Skin: Negative for color change and rash.  Neurological: Negative for weakness and light-headedness.  Hematological: Negative for adenopathy.  Psychiatric/Behavioral: Negative for behavioral problems.   Past Medical History  Diagnosis Date  . HYPERLIPIDEMIA 07/31/2007  . ANXIETY 11/07/2006  . HYPERTENSION 11/07/2006  . OSTEOARTHRITIS 11/07/2006   Past Surgical History  Procedure Date  . Hernia repair     2001 Dr [facility] left groin    reports that he has never smoked. He has never used smokeless tobacco. He reports that he does not drink alcohol or use illicit drugs. family history includes Heart disease in his mother; Hyperlipidemia in his mother; and Hypertension in his mother. No Known Allergies      Objective:   Physical Exam  Constitutional: He is oriented to person, place, and time. He appears well-developed and well-nourished.  HENT:  Head: Normocephalic and atraumatic.  Eyes: Conjunctivae are normal. Pupils are equal, round, and reactive to light.  Neck: Normal range of motion. Neck supple.  Cardiovascular: Normal rate and regular rhythm.   Pulmonary/Chest: Effort normal and breath sounds normal.  Abdominal: Soft. Bowel sounds are normal.  Musculoskeletal: Normal range of motion.  Neurological: He is alert and oriented to person, place, and  time.  Skin: Skin is warm and dry.  Psychiatric: He has a normal mood and affect. His behavior is normal.          Assessment & Plan:   Patient presents for yearly preventative medicine examination.   all immunizations and health maintenance protocols were reviewed with the patient and they are up to date with these protocols.   screening laboratory values were reviewed with the patient including screening of hyperlipidemia PSA renal function and hepatic function.   There medications past medical history social history problem list and allergies were reviewed in detail.   Goals were established with regard to weight loss exercise diet in compliance with medications Patient is doing well he is at goal for all parameters including his blood pressure but not at goal for his cholesterol we have encouraged him to increase his visual to one twice daily rather than just one a day

## 2010-08-02 NOTE — Patient Instructions (Signed)
Be sure to increase the fish oil to 1 Twice a day

## 2010-08-11 NOTE — Procedures (Signed)
NAME:  Nathan Young, ALCALA NO.:  [id number]   MEDICAL RECORD NO.:  [id number]          PATIENT TYPE:  OUT   LOCATION:  SLEEP CENTER                 FACILITY:  [facility]   PHYSICIAN:  Marcelyn Bruins, M.D. [facility] DATE OF BIRTH:  1950/09/27   DATE OF STUDY:                              NOCTURNAL POLYSOMNOGRAM   REFERRING PHYSICIAN:  Dr. Darryll Capers.   DATE OF STUDY:  May 24, 2005.   INDICATION FOR STUDY:  Persistent disorder of initiating and maintaining  sleep.   EPWORTH SLEEPINESS SCORE:  3.   SLEEP ARCHITECTURE:  The patient had total sleep time of 317 minutes with  adequate amount of REM but he never achieved slow wave sleep. Sleep onset  latency was prolonged at 59 minutes and REM onset was normal. Sleep  efficiency was decreased at 82%.   RESPIRATORY DATA:  The patient was found to have 22 hypopneas and 17 apneas  for a respiratory disturbance index of 7 events per hour. These events  primarily occur during REM and supine sleep. There was moderate snoring  noted throughout. The patient did not meet split night criteria secondary to  the small number of events.   OXYGEN DATA:  There was O2 desaturation as low as 83% with the patient's  obstructive events.   CARDIAC DATA:  No clinically significant cardiac arrhythmias were noted.   MOVEMENT/PARASOMNIA:  The patient was found to have 365 leg jerks, however,  less than one per hour resulted in arousal or awakening.   IMPRESSION/RECOMMENDATIONS:  1.  Very mild obstructive sleep apnea with a respiratory disturbance index      of 7 events per hour and O2 desaturation as low as 83%. Treatment for      this degree of sleep apnea may include weight loss alone if applicable,      upper airway surgery, oral appliance, as well as C-PAP. This degree of      sleep apnea will have very little impact on the patient's cardiovascular      health, and therefore treatment decision should be based on quality of      life issues.  2.  Very large numbers of leg jerks were noted; however, they did not appear      to overly impact the patient's sleep.      However, given his history of sleep disruption and large numbers of leg      jerks noted, clinical correlation is suggested to exclude the      possibility of the restless leg syndrome or the periodic leg movement      syndrome.           ______________________________  Marcelyn Bruins, M.D. [facility]  Diplomate, American Board of Sleep  Medicine     KC/MEDQ  D:  05/31/2005 16:09:03  T:  06/01/2005 00:36:42  Job:  [id number]

## 2010-08-11 NOTE — Op Note (Signed)
NAME:  Nathan, Young NO.:  [id number]   MEDICAL RECORD NO.:  [id number]          PATIENT TYPE:  AMB   LOCATION:  DAY                          FACILITY:  [facility]   PHYSICIAN:  Ollen Gross, M.D.    DATE OF BIRTH:  1950-11-12   DATE OF PROCEDURE:  01/07/2006  DATE OF DISCHARGE:                                 OPERATIVE REPORT   PREOPERATIVE DIAGNOSIS:  Right knee lateral meniscal tear.   POSTOPERATIVE DIAGNOSIS:  Right knee lateral meniscal tear.   PROCEDURE:  Right knee arthroscopy and meniscal debridement.   SURGEON:  Ollen Gross, M.D.   ANESTHESIA:  Local with MAC.   ESTIMATED BLOOD LOSS:  Minimal.   DRAIN:  None.   COMPLICATIONS:  None.   CONDITION:  Stable to recovery room.   BRIEF CLINICAL NOTE:  [name] is a 60 year old male with a several month  history of progressively worsening right knee pain and mechanical symptoms.  He has had cortisone injection that have not been beneficial.  MRI  demonstrated a lateral meniscal tear and some degenerative change in other  compartments.  He presents now for arthroscopy and debridement.   PROCEDURE IN DETAIL:  After successful initiation of local with MAC  anesthetic, a tourniquet was placed high on the right thigh and the right  lower extremity prepped and draped in the usual sterile fashion.  Standard  superomedial and inferolateral incisions were made and in flow cannula  passed superomedial, camera passed inferolateral.  Arthroscopic  visualization proceeds.  Under surface of patella and trochlea looked  normal.  Medial and lateral gutters were visualized and there were no loose  bodies.  Flexion and valgus force applied to the medial compartment and that  looks normal.  A spinal needle was used to localize the inferomedial portal,  a small incision made, dilator placed.  The intercondylar notch was  visualized, the ACL looks normal.  Lateral compartment centrally has a tear  in the body and  posterior horn and lateral meniscus.  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  The meniscus was debrided back to a stable base with baskets and a 4.2-mm  shaver.  It was sealed with the ArthroCare device to make a smooth edge.  The lateral compartment was further inspected and there were no loose bodies  or other chondral abnormalities.  The arthroscopic equipment was  then removed from the inferior portals which were closed with interrupted 4-  0 nylon.  20 mL of 0.25% Marcaine with epi was injected through the inflow  cannula, then that is removed and that portal closed with nylon.  A bulky  sterile dressing is applied and he is awakened and transferred to recovery  in stable condition.      Ollen Gross, M.D.  Electronically Signed     FA/MEDQ  D:  01/07/2006  T:  01/08/2006  Job:  [id number]

## 2010-08-16 ENCOUNTER — Other Ambulatory Visit: Payer: Self-pay | Admitting: Internal Medicine

## 2010-08-17 ENCOUNTER — Other Ambulatory Visit: Payer: Self-pay | Admitting: *Deleted

## 2010-08-17 ENCOUNTER — Encounter: Payer: Self-pay | Admitting: Internal Medicine

## 2010-08-17 MED ORDER — DIAZEPAM 5 MG PO TABS: 5.0000 mg | ORAL_TABLET | Freq: Four times a day (QID) | ORAL | Status: DC | PRN

## 2010-08-17 MED ORDER — QUETIAPINE FUMARATE 50 MG PO TABS: 50.0000 mg | ORAL_TABLET | Freq: Every day | ORAL | Status: DC

## 2010-08-17 MED ORDER — QUETIAPINE FUMARATE 50 MG PO TABS: 25.0000 mg | ORAL_TABLET | Freq: Every day | ORAL | Status: DC

## 2011-01-25 ENCOUNTER — Other Ambulatory Visit (INDEPENDENT_AMBULATORY_CARE_PROVIDER_SITE_OTHER): Payer: 59

## 2011-01-25 DIAGNOSIS — E785 Hyperlipidemia, unspecified: Secondary | ICD-10-CM

## 2011-01-25 LAB — HEPATIC FUNCTION PANEL
AST: 26 U/L (ref 0–37)
Total Bilirubin: 0.9 mg/dL (ref 0.3–1.2)

## 2011-01-25 LAB — LIPID PANEL
Cholesterol: 199 mg/dL (ref 0–200)
HDL: 39.8 mg/dL (ref >39.00)
LDL Cholesterol: 130 mg/dL — ABNORMAL HIGH (ref 0–99)
Triglycerides: 148 mg/dL (ref 0.0–149.0)
VLDL: 29.6 mg/dL (ref 0.0–40.0)

## 2011-02-02 ENCOUNTER — Encounter: Payer: Self-pay | Admitting: Internal Medicine

## 2011-02-02 ENCOUNTER — Ambulatory Visit (INDEPENDENT_AMBULATORY_CARE_PROVIDER_SITE_OTHER): Payer: 59 | Admitting: Internal Medicine

## 2011-02-02 VITALS — BP 130/80 | HR 72 | Temp 98.2°F | Resp 16 | Ht 70.0 in | Wt 186.0 lb

## 2011-02-02 DIAGNOSIS — E785 Hyperlipidemia, unspecified: Secondary | ICD-10-CM

## 2011-02-02 DIAGNOSIS — G4700 Insomnia, unspecified: Secondary | ICD-10-CM

## 2011-02-02 DIAGNOSIS — I10 Essential (primary) hypertension: Secondary | ICD-10-CM

## 2011-02-02 DIAGNOSIS — M722 Plantar fascial fibromatosis: Secondary | ICD-10-CM

## 2011-02-02 DIAGNOSIS — Z23 Encounter for immunization: Secondary | ICD-10-CM

## 2011-02-02 NOTE — Progress Notes (Signed)
  Subjective:    Patient ID: Nathan Young, male    DOB: 02-Sep-1950, 60 y.o.   MRN: [id number]  HPI plantar fasciitis patient's primary complaint of pain in his left heel it is worse after rest severe initially then decreases in intensity.  He has history of hypertension hyperlipidemia and a history of osteoarthritis.  He is prior history of plantar fasciitis    Review of Systems  Constitutional: Negative for fever and fatigue.  HENT: Negative for hearing loss, congestion, neck pain and postnasal drip.   Eyes: Negative for discharge, redness and visual disturbance.  Respiratory: Negative for cough, shortness of breath and wheezing.   Cardiovascular: Negative for leg swelling.  Gastrointestinal: Negative for abdominal pain, constipation and abdominal distention.  Genitourinary: Negative for urgency and frequency.  Musculoskeletal: Positive for joint swelling and arthralgias.  Skin: Negative for color change and rash.  Neurological: Negative for weakness and light-headedness.  Hematological: Negative for adenopathy.  Psychiatric/Behavioral: Negative for behavioral problems.   Past Medical History  Diagnosis Date  . HYPERLIPIDEMIA 07/31/2007  . ANXIETY 11/07/2006  . HYPERTENSION 11/07/2006  . OSTEOARTHRITIS 11/07/2006   Past Surgical History  Procedure Date  . Hernia repair     2001 Dr [place] left groin    reports that he has never smoked. He has never used smokeless tobacco. He reports that he does not drink alcohol or use illicit drugs. family history includes Heart disease in his mother; Hyperlipidemia in his mother; and Hypertension in his mother. No Known Allergies     Objective:   Physical Exam  Vitals reviewed. Constitutional: He appears well-developed and well-nourished.  HENT:  Head: Normocephalic and atraumatic.  Eyes: Conjunctivae are normal. Pupils are equal, round, and reactive to light.  Neck: Normal range of motion. Neck supple.  Cardiovascular: Normal  rate and regular rhythm.   Pulmonary/Chest: Effort normal and breath sounds normal.  Abdominal: Soft. Bowel sounds are normal.  Musculoskeletal: He exhibits tenderness.       Tenderness along the medial aspects of the left heel          Assessment & Plan:  Patient's blood pressure is stable on his current medications.  He has persistent insomnia but has been weaning himself off his sleeping medications.  His cholesterol is stable per monitoring labs he is compliant with medications we recommended that he not try to decrease either his blood pressure medicines or his lipid-lowering medications Injected left heel with 20 mg of Depo-Medrol and 4 cc of 1% lidocaine at the site of inflammation or plantar fasciitis patient tolerated procedure well

## 2011-02-02 NOTE — Patient Instructions (Signed)
The patient is instructed to continue all medications as prescribed. Schedule followup with check out clerk upon leaving the clinic  

## 2011-02-05 MED ORDER — METHYLPREDNISOLONE ACETATE 40 MG/ML IJ SUSP: 20.0000 mg | Freq: Once | INTRAMUSCULAR | Status: DC

## 2011-04-03 ENCOUNTER — Other Ambulatory Visit: Payer: Self-pay | Admitting: *Deleted

## 2011-04-03 MED ORDER — DIAZEPAM 5 MG PO TABS: 5.0000 mg | ORAL_TABLET | Freq: Four times a day (QID) | ORAL | Status: DC | PRN

## 2011-05-16 ENCOUNTER — Ambulatory Visit (INDEPENDENT_AMBULATORY_CARE_PROVIDER_SITE_OTHER): Payer: 59 | Admitting: Internal Medicine

## 2011-05-16 ENCOUNTER — Encounter: Payer: Self-pay | Admitting: Internal Medicine

## 2011-05-16 DIAGNOSIS — M722 Plantar fascial fibromatosis: Secondary | ICD-10-CM | POA: Insufficient documentation

## 2011-05-16 DIAGNOSIS — M7730 Calcaneal spur, unspecified foot: Secondary | ICD-10-CM

## 2011-05-16 MED ORDER — METHYLPREDNISOLONE ACETATE PF 40 MG/ML IJ SUSP: 40.0000 mg | Freq: Once | INTRAMUSCULAR | Status: DC

## 2011-05-16 NOTE — Progress Notes (Signed)
Subjective:    Patient ID: Nathan Young, male    DOB: 08/02/1950, 61 y.o.   MRN: [id number]  HPI  Presents for acute visit for plantar fasciitis of his left foot.  Has a history of plantar fasciitis and has extreme burning pain in his left heel radiating to the side of the foot.  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  HENT: Negative for hearing loss, congestion, neck pain and postnasal drip.   Eyes: Negative for discharge, redness and visual disturbance.  Respiratory: Negative for cough, shortness of breath and wheezing.   Cardiovascular: Negative for leg swelling.  Gastrointestinal: Negative for abdominal pain, constipation and abdominal distention.  Genitourinary: Negative for urgency and frequency.  Musculoskeletal: Negative for joint swelling and arthralgias.  Skin: Negative for color change and rash.  Neurological: Negative for weakness and light-headedness.  Hematological: Negative for adenopathy.  Psychiatric/Behavioral: Negative for behavioral problems.   Past Medical History  Diagnosis Date  . HYPERLIPIDEMIA 07/31/2007  . ANXIETY 11/07/2006  . HYPERTENSION 11/07/2006  . OSTEOARTHRITIS 11/07/2006    History   Social History  . Marital Status: Married    Spouse Name: N/A    Number of Children: N/A  . Years of Education: N/A   Occupational History  . Not on file.   Social History Main Topics  . Smoking status: Never Smoker   . Smokeless tobacco: Never Used  . Alcohol Use: No  . Drug Use: No  . Sexually Active: Yes   Other Topics Concern  . Not on file   Social History Narrative  . No narrative on file    Past Surgical History  Procedure Date  . Hernia repair     2001 Dr [place] left groin    Family History  Problem Relation Age of Onset  . Heart disease Mother     pacer  . Hyperlipidemia Mother   . Hypertension Mother     No Known  Allergies  Current Outpatient Prescriptions on File Prior to Visit  Medication Sig Dispense Refill  . atenolol (TENORMIN) 100 MG tablet 1/2 ONCE DAILY  90 tablet  2  . diazepam (VALIUM) 5 MG tablet Take 1 tablet (5 mg total) by mouth every 6 (six) hours as needed.  30 tablet  5  . fish oil-omega-3 fatty acids 1000 MG capsule Take 2 g by mouth 2 (two) times daily.        . Inositol Niacinate (NIACIN FLUSH FREE) 500 MG CAPS Take by mouth daily.       . QUEtiapine (SEROQUEL) 50 MG tablet Take 25 mg by mouth at bedtime.        . tadalafil (CIALIS) 5 MG tablet Take 5 mg by mouth daily as needed.         Current Facility-Administered Medications on File Prior to Visit  Medication Dose Route Frequency Provider Last Rate Last Dose  . methylPREDNISolone acetate (DEPO-MEDROL) injection 20 mg  20 mg Intra-articular Once Carrie Mew, MD        There were no vitals taken for this visit.       Objective:   Physical Exam  Nursing note and vitals reviewed. Constitutional: He appears well-developed and well-nourished.  HENT:  Head: Normocephalic and atraumatic.  Eyes: Conjunctivae are normal. Pupils are equal, round, and reactive to light.  Neck: Normal range of motion. Neck supple.  Cardiovascular:  Normal rate and regular rhythm.   Pulmonary/Chest: Effort normal and breath sounds normal.  Abdominal: Soft. Bowel sounds are normal.          Assessment & Plan:   Informed consent was obtained. The patient's heel was prepped with Betadine in a sterile manner and 40 mg of Depo-Medrol with 1/2 cc of 1% lidocaine without epinephrine was inserted into the left plantar fascial at the heel. The patient tolerated the procedure well.

## 2011-05-16 NOTE — Patient Instructions (Signed)
You have received a steroid injection into a joint space. It will take up to 48 hours before you notice a difference in the pain in the joint. For the next few hours keep ice on the site of the injection. Do not exert the injected joint for the next 24 hours.  

## 2011-05-31 ENCOUNTER — Ambulatory Visit (INDEPENDENT_AMBULATORY_CARE_PROVIDER_SITE_OTHER)
Admission: RE | Admit: 2011-05-31 | Discharge: 2011-05-31 | Disposition: A | Discharge status: Home / Self Care | Payer: 59 | Source: Ambulatory Visit | Attending: Internal Medicine | Admitting: Internal Medicine

## 2011-05-31 ENCOUNTER — Ambulatory Visit (INDEPENDENT_AMBULATORY_CARE_PROVIDER_SITE_OTHER): Payer: 59 | Admitting: Internal Medicine

## 2011-05-31 ENCOUNTER — Encounter: Payer: Self-pay | Admitting: Internal Medicine

## 2011-05-31 DIAGNOSIS — M722 Plantar fascial fibromatosis: Secondary | ICD-10-CM

## 2011-05-31 DIAGNOSIS — M7730 Calcaneal spur, unspecified foot: Secondary | ICD-10-CM

## 2011-05-31 DIAGNOSIS — I10 Essential (primary) hypertension: Secondary | ICD-10-CM

## 2011-05-31 DIAGNOSIS — M79672 Pain in left foot: Secondary | ICD-10-CM

## 2011-05-31 DIAGNOSIS — T887XXA Unspecified adverse effect of drug or medicament, initial encounter: Secondary | ICD-10-CM

## 2011-05-31 DIAGNOSIS — M79609 Pain in unspecified limb: Secondary | ICD-10-CM

## 2011-05-31 DIAGNOSIS — E785 Hyperlipidemia, unspecified: Secondary | ICD-10-CM

## 2011-05-31 LAB — BASIC METABOLIC PANEL
CO2: 26 mEq/L (ref 19–32)
Calcium: 9.1 mg/dL (ref 8.4–10.5)
Chloride: 104 mEq/L (ref 96–112)
Potassium: 4.2 mEq/L (ref 3.5–5.1)
Sodium: 138 mEq/L (ref 135–145)

## 2011-05-31 LAB — LIPID PANEL
HDL: 43.3 mg/dL (ref >39.00)
Total CHOL/HDL Ratio: 4

## 2011-05-31 LAB — HEPATIC FUNCTION PANEL
AST: 20 U/L (ref 0–37)
Alkaline Phosphatase: 42 U/L (ref 39–117)
Bilirubin, Direct: 0 mg/dL (ref 0.0–0.3)
Total Protein: 7.5 g/dL (ref 6.0–8.3)

## 2011-05-31 MED ORDER — ATENOLOL 100 MG PO TABS: 50.0000 mg | ORAL_TABLET | Freq: Every day | ORAL | Status: DC

## 2011-05-31 MED ORDER — QUETIAPINE FUMARATE 50 MG PO TABS: 25.0000 mg | ORAL_TABLET | Freq: Every day | ORAL | Status: DC

## 2011-05-31 MED ORDER — TADALAFIL 5 MG PO TABS: 5.0000 mg | ORAL_TABLET | Freq: Every day | ORAL | Status: DC | PRN

## 2011-05-31 NOTE — Patient Instructions (Signed)
The patient is instructed to continue all medications as prescribed. Schedule followup with check out clerk upon leaving the clinic  

## 2011-05-31 NOTE — Progress Notes (Signed)
Addended by: Willy Eddy on: 05/31/2011 10:40 AM   Modules accepted: Orders

## 2011-05-31 NOTE — Progress Notes (Signed)
Subjective:    Patient ID: Nathan Young, male    DOB: May 10, 1950, 61 y.o.   MRN: [id number]  HPI Follow up for  Heel pain    Review of Systems  Constitutional: Negative for fever and fatigue.  HENT: Negative for hearing loss, congestion, neck pain and postnasal drip.   Eyes: Negative for discharge, redness and visual disturbance.  Respiratory: Negative for cough, shortness of breath and wheezing.   Cardiovascular: Negative for leg swelling.  Gastrointestinal: Negative for abdominal pain, constipation and abdominal distention.  Genitourinary: Negative for urgency and frequency.  Musculoskeletal: Negative for joint swelling and arthralgias.  Skin: Negative for color change and rash.  Neurological: Negative for weakness and light-headedness.  Hematological: Negative for adenopathy.  Psychiatric/Behavioral: Negative for behavioral problems.   Past Medical History  Diagnosis Date  . HYPERLIPIDEMIA 07/31/2007  . ANXIETY 11/07/2006  . HYPERTENSION 11/07/2006  . OSTEOARTHRITIS 11/07/2006    History   Social History  . Marital Status: Married    Spouse Name: N/A    Number of Children: N/A  . Years of Education: N/A   Occupational History  . Not on file.   Social History Main Topics  . Smoking status: Never Smoker   . Smokeless tobacco: Never Used  . Alcohol Use: No  . Drug Use: No  . Sexually Active: Yes   Other Topics Concern  . Not on file   Social History Narrative  . No narrative on file    Past Surgical History  Procedure Date  . Hernia repair     2001 Dr [place] left groin    Family History  Problem Relation Age of Onset  . Heart disease Mother     pacer  . Hyperlipidemia Mother   . Hypertension Mother     No Known Allergies  Current Outpatient Prescriptions on File Prior to Visit  Medication Sig Dispense Refill  . atenolol (TENORMIN) 100 MG tablet 1/2 ONCE DAILY  90 tablet  2  . diazepam (VALIUM) 5 MG tablet Take 1 tablet (5 mg total) by mouth  every 6 (six) hours as needed.  30 tablet  5  . fish oil-omega-3 fatty acids 1000 MG capsule Take 2 g by mouth 2 (two) times daily.        . Inositol Niacinate (NIACIN FLUSH FREE) 500 MG CAPS Take by mouth daily.       . QUEtiapine (SEROQUEL) 50 MG tablet Take 25 mg by mouth at bedtime.        . tadalafil (CIALIS) 5 MG tablet Take 5 mg by mouth daily as needed.         Current Facility-Administered Medications on File Prior to Visit  Medication Dose Route Frequency Provider Last Rate Last Dose  . DISCONTD: methylPREDNISolone acetate (DEPO-MEDROL) injection 20 mg  20 mg Intra-articular Once Carrie Mew, MD      . DISCONTD: methylPREDNISolone acetate PF (DEPO-MEDROL) injection 40 mg  40 mg Intra-articular Once Carrie Mew, MD        BP 130/80  Pulse 72  Temp 98.3 F (36.8 C)  Resp 16  Ht 5\' 10"  (1.778 m)  Wt 187 lb (84.823 kg)  BMI 26.83 kg/m2       Objective:   Physical Exam  Nursing note and vitals reviewed. Constitutional: He appears well-developed and well-nourished.  HENT:  Head: Normocephalic and atraumatic.  Eyes: Conjunctivae are normal. Pupils are equal, round, and reactive to light.  Neck: Normal range of motion. Neck  supple.  Cardiovascular: Normal rate and regular rhythm.   Pulmonary/Chest: Effort normal and breath sounds normal.  Abdominal: Soft. Bowel sounds are normal.    Left ankle with tenderness along the superior calcaneus and inferior calcaneus most prominent superiorly      Assessment & Plan:  Possible calcaneal spur with chronic pain since 4 left foot complete x-rays give a point injection today for pain control and place a modified Jackson wrap for heel mobilization  Blood pressure stable What her lipid and liver today for cholesterol control monitor basic metabolic panel for a fact of hypertension

## 2011-06-01 ENCOUNTER — Telehealth: Payer: Self-pay | Admitting: Internal Medicine

## 2011-06-01 NOTE — Telephone Encounter (Signed)
Pt would like xray results. 

## 2011-06-01 NOTE — Telephone Encounter (Signed)
Pt informed

## 2011-07-31 ENCOUNTER — Ambulatory Visit: Payer: 59 | Admitting: Internal Medicine

## 2011-10-01 ENCOUNTER — Other Ambulatory Visit (INDEPENDENT_AMBULATORY_CARE_PROVIDER_SITE_OTHER): Payer: 59

## 2011-10-01 DIAGNOSIS — Z0000 Encounter for general adult medical examination without abnormal findings: Secondary | ICD-10-CM

## 2011-10-01 LAB — CBC WITH DIFFERENTIAL/PLATELET
Basophils Relative: 0.8 % (ref 0.0–3.0)
Eosinophils Absolute: 0.2 10*3/uL (ref 0.0–0.7)
Eosinophils Relative: 3.3 % (ref 0.0–5.0)
Lymphocytes Relative: 30.4 % (ref 12.0–46.0)
MCHC: 33 g/dL (ref 30.0–36.0)
MCV: 100.5 fl — ABNORMAL HIGH (ref 78.0–100.0)
Monocytes Absolute: 0.6 10*3/uL (ref 0.1–1.0)
Neutrophils Relative %: 57.3 % (ref 43.0–77.0)
Platelets: 251 10*3/uL (ref 150.0–400.0)
RBC: 4.72 Mil/uL (ref 4.22–5.81)
WBC: 7 10*3/uL (ref 4.5–10.5)

## 2011-10-01 LAB — BASIC METABOLIC PANEL
BUN: 12 mg/dL (ref 6–23)
Calcium: 9.2 mg/dL (ref 8.4–10.5)
Chloride: 105 mEq/L (ref 96–112)
Creatinine, Ser: 1.1 mg/dL (ref 0.4–1.5)

## 2011-10-01 LAB — HEPATIC FUNCTION PANEL
Alkaline Phosphatase: 38 U/L — ABNORMAL LOW (ref 39–117)
Bilirubin, Direct: 0.1 mg/dL (ref 0.0–0.3)
Total Bilirubin: 1 mg/dL (ref 0.3–1.2)

## 2011-10-01 LAB — POCT URINALYSIS DIPSTICK
Blood, UA: NEGATIVE
Leukocytes, UA: NEGATIVE
Nitrite, UA: NEGATIVE
Protein, UA: NEGATIVE
Urobilinogen, UA: 0.2
pH, UA: 7

## 2011-10-01 LAB — LIPID PANEL
Cholesterol: 217 mg/dL — ABNORMAL HIGH (ref 0–200)
Total CHOL/HDL Ratio: 5
Triglycerides: 160 mg/dL — ABNORMAL HIGH (ref 0.0–149.0)
VLDL: 32 mg/dL (ref 0.0–40.0)

## 2011-10-03 ENCOUNTER — Other Ambulatory Visit: Payer: 59

## 2011-10-10 ENCOUNTER — Ambulatory Visit (INDEPENDENT_AMBULATORY_CARE_PROVIDER_SITE_OTHER): Payer: 59 | Admitting: Internal Medicine

## 2011-10-10 ENCOUNTER — Other Ambulatory Visit: Payer: Self-pay | Admitting: *Deleted

## 2011-10-10 ENCOUNTER — Encounter: Payer: Self-pay | Admitting: Internal Medicine

## 2011-10-10 VITALS — BP 136/76 | HR 72 | Temp 98.6°F | Resp 16 | Ht 70.0 in | Wt 188.0 lb

## 2011-10-10 DIAGNOSIS — T887XXA Unspecified adverse effect of drug or medicament, initial encounter: Secondary | ICD-10-CM

## 2011-10-10 DIAGNOSIS — I10 Essential (primary) hypertension: Secondary | ICD-10-CM

## 2011-10-10 DIAGNOSIS — E785 Hyperlipidemia, unspecified: Secondary | ICD-10-CM

## 2011-10-10 DIAGNOSIS — Z23 Encounter for immunization: Secondary | ICD-10-CM

## 2011-10-10 DIAGNOSIS — Z0000 Encounter for general adult medical examination without abnormal findings: Secondary | ICD-10-CM

## 2011-10-10 MED ORDER — ATENOLOL 100 MG PO TABS: 50.0000 mg | ORAL_TABLET | Freq: Every day | ORAL | Status: DC

## 2011-10-10 MED ORDER — DIAZEPAM 5 MG PO TABS: 5.0000 mg | ORAL_TABLET | Freq: Four times a day (QID) | ORAL | Status: DC | PRN

## 2011-10-10 MED ORDER — RED YEAST RICE 600 MG PO CAPS: 1.0000 | ORAL_CAPSULE | Freq: Two times a day (BID) | ORAL | Status: DC

## 2011-10-10 MED ORDER — TADALAFIL 5 MG PO TABS: 5.0000 mg | ORAL_TABLET | Freq: Every day | ORAL | Status: AC | PRN

## 2011-10-10 MED ORDER — QUETIAPINE FUMARATE 50 MG PO TABS: 25.0000 mg | ORAL_TABLET | Freq: Every day | ORAL | Status: DC

## 2011-10-10 MED ORDER — OMEGA-3 FATTY ACIDS 1000 MG PO CAPS: 1.0000 g | ORAL_CAPSULE | Freq: Two times a day (BID) | ORAL | Status: DC

## 2011-10-10 NOTE — Progress Notes (Signed)
Subjective:    Patient ID: Nathan Young, male    DOB: [birth year], 61 y.o.   MRN: [id number]  HPI Patient is a 61 year old male presents for his yearly physical.  He is also followed for hypertension on Tenormin he also is followed for hyperlipidemia and he has history of insomnia for which he takes Seroquel.  He his blood pressure stable his current medications he is having no significant side effects from his medications.  His cholesterol however is significantly increased we discussed discontinuing the niacin due to recent studies and increasing the red yeast to twice a day and the omega-3 1000 mg twice daily   Review of Systems  Constitutional: Negative for fever and fatigue.  HENT: Negative for hearing loss, congestion, neck pain and postnasal drip.   Eyes: Negative for discharge, redness and visual disturbance.  Respiratory: Negative for cough, shortness of breath and wheezing.   Cardiovascular: Negative for leg swelling.  Gastrointestinal: Negative for abdominal pain, constipation and abdominal distention.  Genitourinary: Negative for urgency and frequency.  Musculoskeletal: Negative for joint swelling and arthralgias.  Skin: Negative for color change and rash.  Neurological: Negative for weakness and light-headedness.  Hematological: Negative for adenopathy.  Psychiatric/Behavioral: Negative for behavioral problems.   Past Medical History  Diagnosis Date  . HYPERLIPIDEMIA 07/31/2007  . ANXIETY 11/07/2006  . HYPERTENSION 11/07/2006  . OSTEOARTHRITIS 11/07/2006    History   Social History  . Marital Status: Married    Spouse Name: N/A    Number of Children: N/A  . Years of Education: N/A   Occupational History  . Not on file.   Social History Main Topics  . Smoking status: Never Smoker   . Smokeless tobacco: Never Used  . Alcohol Use: No  . Drug Use: No  . Sexually Active: Yes   Other Topics Concern  . Not on file   Social History Narrative  . No narrative  on file    Past Surgical History  Procedure Date  . Hernia repair     2001 Dr [place] left groin    Family History  Problem Relation Age of Onset  . Heart disease Mother     pacer  . Hyperlipidemia Mother   . Hypertension Mother     No Known Allergies  Current Outpatient Prescriptions on File Prior to Visit  Medication Sig Dispense Refill  . atenolol (TENORMIN) 100 MG tablet Take 0.5 tablets (50 mg total) by mouth daily.  45 tablet  3  . diazepam (VALIUM) 5 MG tablet Take 1 tablet (5 mg total) by mouth every 6 (six) hours as needed.  30 tablet  5  . fish oil-omega-3 fatty acids 1000 MG capsule Take 2 g by mouth 2 (two) times daily.        . Inositol Niacinate (NIACIN FLUSH FREE) 500 MG CAPS Take by mouth daily.       . QUEtiapine (SEROQUEL) 50 MG tablet Take 0.5 tablets (25 mg total) by mouth at bedtime.  90 tablet  3  . tadalafil (CIALIS) 5 MG tablet Take 1 tablet (5 mg total) by mouth daily as needed.  10 tablet  3  . DISCONTD: QUEtiapine (SEROQUEL) 50 MG tablet Take 0.5 tablets (25 mg total) by mouth at bedtime.  15 tablet  0    BP 136/76  Pulse 72  Temp 98.6 F (37 C)  Resp 16  Ht 5\' 10"  (1.778 m)  Wt 188 lb (85.276 kg)  BMI 26.98 kg/m2  Objective:   Physical Exam  Nursing note and vitals reviewed. Constitutional: He is oriented to person, place, and time. He appears well-developed and well-nourished.  HENT:  Head: Normocephalic and atraumatic.  Eyes: Conjunctivae are normal. Pupils are equal, round, and reactive to light.  Neck: Normal range of motion. Neck supple.  Cardiovascular: Normal rate and regular rhythm.   Pulmonary/Chest: Effort normal and breath sounds normal.  Abdominal: Soft. Bowel sounds are normal.  Genitourinary: Prostate normal.  Musculoskeletal: Normal range of motion.  Neurological: He is alert and oriented to person, place, and time.  Skin: Skin is warm and dry.  Psychiatric: He has a normal mood and affect. His behavior is  normal.          Assessment & Plan:   Patient presents for yearly preventative medicine examination.   all immunizations and health maintenance protocols were reviewed with the patient and they are up to date with these protocols.   screening laboratory values were reviewed with the patient including screening of hyperlipidemia PSA renal function and hepatic function.   There medications past medical history social history problem list and allergies were reviewed in detail.   Goals were established with regard to weight loss exercise diet in compliance with medications  Modification of his cholesterol regimen with the addition of bread yeast rice caps and increasing the omega-3's twice daily while discontinuing the niacin.  Stable on current medications for hypertension a history of plantar fasciitis and heel spur now and a foot brace doing well

## 2011-10-10 NOTE — Patient Instructions (Signed)
Take 1000 mg of omega-3's twice daily and take 600 mg of red rice yeast twice daily

## 2011-10-10 NOTE — Addendum Note (Signed)
Addended by: Willy Eddy on: 10/10/2011 10:25 AM   Modules accepted: Orders

## 2011-10-10 NOTE — Addendum Note (Signed)
Addended by: Willy Eddy on: 10/10/2011 10:08 AM   Modules accepted: Orders

## 2012-01-08 ENCOUNTER — Other Ambulatory Visit (INDEPENDENT_AMBULATORY_CARE_PROVIDER_SITE_OTHER): Payer: 59

## 2012-01-08 DIAGNOSIS — E785 Hyperlipidemia, unspecified: Secondary | ICD-10-CM

## 2012-01-08 DIAGNOSIS — T887XXA Unspecified adverse effect of drug or medicament, initial encounter: Secondary | ICD-10-CM

## 2012-01-08 LAB — LIPID PANEL
Cholesterol: 164 mg/dL (ref 0–200)
LDL Cholesterol: 112 mg/dL — ABNORMAL HIGH (ref 0–99)

## 2012-01-08 LAB — HEPATIC FUNCTION PANEL
ALT: 17 U/L (ref 0–53)
AST: 19 U/L (ref 0–37)
Albumin: 3.7 g/dL (ref 3.5–5.2)
Total Protein: 7.6 g/dL (ref 6.0–8.3)

## 2012-01-15 ENCOUNTER — Ambulatory Visit (INDEPENDENT_AMBULATORY_CARE_PROVIDER_SITE_OTHER): Payer: 59 | Admitting: Internal Medicine

## 2012-01-15 ENCOUNTER — Encounter: Payer: Self-pay | Admitting: Internal Medicine

## 2012-01-15 VITALS — BP 120/78 | HR 72 | Temp 98.2°F | Resp 16 | Ht 70.0 in | Wt 182.0 lb

## 2012-01-15 DIAGNOSIS — E785 Hyperlipidemia, unspecified: Secondary | ICD-10-CM

## 2012-01-15 DIAGNOSIS — Z23 Encounter for immunization: Secondary | ICD-10-CM

## 2012-01-15 DIAGNOSIS — I10 Essential (primary) hypertension: Secondary | ICD-10-CM

## 2012-01-15 MED ORDER — ATENOLOL 100 MG PO TABS: 50.0000 mg | ORAL_TABLET | Freq: Every day | ORAL | Status: DC

## 2012-01-15 NOTE — Patient Instructions (Addendum)
The patient is instructed to continue all medications as prescribed. Schedule followup with check out clerk upon leaving the clinic  

## 2012-01-15 NOTE — Progress Notes (Signed)
Subjective:    Patient ID: Nathan Young, male    DOB: 03/05/1951, 61 y.o.   MRN: [id number]  HPI  monitoring of lipid control with diet, fish oil and red rice yeast supplements Stable HTN Plantar foot pain due to spur and tendonistis  Review of Systems  Constitutional: Negative for fever and fatigue.  HENT: Negative for hearing loss, congestion, neck pain and postnasal drip.   Eyes: Negative for discharge, redness and visual disturbance.  Respiratory: Negative for cough, shortness of breath and wheezing.   Cardiovascular: Negative for leg swelling.  Gastrointestinal: Negative for abdominal pain, constipation and abdominal distention.  Genitourinary: Negative for urgency and frequency.  Musculoskeletal: Negative for joint swelling and arthralgias.  Skin: Negative for color change and rash.  Neurological: Negative for weakness and light-headedness.  Hematological: Negative for adenopathy.  Psychiatric/Behavioral: Negative for behavioral problems.       Past Medical History  Diagnosis Date  . HYPERLIPIDEMIA 07/31/2007  . ANXIETY 11/07/2006  . HYPERTENSION 11/07/2006  . OSTEOARTHRITIS 11/07/2006    History   Social History  . Marital Status: Married    Spouse Name: N/A    Number of Children: N/A  . Years of Education: N/A   Occupational History  . Not on file.   Social History Main Topics  . Smoking status: Never Smoker   . Smokeless tobacco: Never Used  . Alcohol Use: No  . Drug Use: No  . Sexually Active: Yes   Other Topics Concern  . Not on file   Social History Narrative  . No narrative on file    Past Surgical History  Procedure Date  . Hernia repair     2001 Dr [place] left groin    Family History  Problem Relation Age of Onset  . Heart disease Mother     pacer  . Hyperlipidemia Mother   . Hypertension Mother     No Known Allergies  Current Outpatient Prescriptions on File Prior to Visit  Medication Sig Dispense Refill  . atenolol  (TENORMIN) 100 MG tablet Take 0.5 tablets (50 mg total) by mouth daily.  45 tablet  3  . diazepam (VALIUM) 5 MG tablet Take 1 tablet (5 mg total) by mouth every 6 (six) hours as needed.  30 tablet  5  . fish oil-omega-3 fatty acids 1000 MG capsule Take 1 capsule (1 g total) by mouth 2 (two) times daily.      . QUEtiapine (SEROQUEL) 50 MG tablet Take 0.5 tablets (25 mg total) by mouth at bedtime.  90 tablet  3  . QUEtiapine (SEROQUEL) 50 MG tablet Take 0.5 tablets (25 mg total) by mouth at bedtime.  15 tablet  0  . Red Yeast Rice 600 MG CAPS Take 1 capsule (600 mg total) by mouth 2 (two) times daily.        There were no vitals taken for this visit.    Objective:   Physical Exam  Nursing note and vitals reviewed. Constitutional: He appears well-developed and well-nourished.  HENT:  Head: Normocephalic and atraumatic.  Eyes: Conjunctivae normal are normal. Pupils are equal, round, and reactive to light.  Neck: Normal range of motion. Neck supple.  Cardiovascular: Normal rate and regular rhythm.   Pulmonary/Chest: Effort normal and breath sounds normal.  Abdominal: Soft. Bowel sounds are normal.          Assessment & Plan:  Presents for review of a plan for his cholesterol using omega-3 fatty acids and red rice yeast  He has also lost 6 pounds his liver functions are completely normal and his total cholesterol is the best recorded at 164 history glycerides are 97 his good cholesterol was 32 hepatic cholesterol was 112 for an excellent cholesterol profile  Blood pressure stable  Has reduced snak foods  Wearing foot brace for plantar facial pain

## 2012-03-12 ENCOUNTER — Other Ambulatory Visit: Payer: Self-pay | Admitting: Internal Medicine

## 2012-05-13 ENCOUNTER — Other Ambulatory Visit: Payer: Self-pay | Admitting: Internal Medicine

## 2012-05-19 ENCOUNTER — Encounter: Payer: Self-pay | Admitting: Internal Medicine

## 2012-05-19 ENCOUNTER — Ambulatory Visit (INDEPENDENT_AMBULATORY_CARE_PROVIDER_SITE_OTHER): Payer: 59 | Admitting: Internal Medicine

## 2012-05-19 VITALS — BP 112/74 | HR 68 | Temp 98.3°F | Resp 16 | Ht 70.0 in | Wt 175.0 lb

## 2012-05-19 DIAGNOSIS — M25561 Pain in right knee: Secondary | ICD-10-CM

## 2012-05-19 DIAGNOSIS — M25569 Pain in unspecified knee: Secondary | ICD-10-CM

## 2012-05-19 DIAGNOSIS — I10 Essential (primary) hypertension: Secondary | ICD-10-CM

## 2012-05-19 DIAGNOSIS — E785 Hyperlipidemia, unspecified: Secondary | ICD-10-CM

## 2012-05-19 LAB — LIPID PANEL
HDL: 38.8 mg/dL — ABNORMAL LOW (ref >39.00)
LDL Cholesterol: 108 mg/dL — ABNORMAL HIGH (ref 0–99)
Total CHOL/HDL Ratio: 4
Triglycerides: 73 mg/dL (ref 0.0–149.0)
VLDL: 14.6 mg/dL (ref 0.0–40.0)

## 2012-05-19 NOTE — Progress Notes (Signed)
Subjective:    Patient ID: Nathan Young, male    DOB: Dec 19, 1950, 61 y.o.   MRN: [id number]  HPI  The patient has been on 50 mg of tenormin tolerating well Sleep is stable with the seroquil has not been able to reduce below 25 Lipids are stable CPX  Review of Systems  Constitutional: Negative for fever and fatigue.  HENT: Negative for hearing loss, congestion, neck pain and postnasal drip.   Eyes: Negative for discharge, redness and visual disturbance.  Respiratory: Negative for cough, shortness of breath and wheezing.   Cardiovascular: Negative for leg swelling.  Gastrointestinal: Negative for abdominal pain, constipation and abdominal distention.  Genitourinary: Negative for urgency and frequency.  Musculoskeletal: Negative for joint swelling and arthralgias.  Skin: Negative for color change and rash.  Neurological: Negative for weakness and light-headedness.  Hematological: Negative for adenopathy.  Psychiatric/Behavioral: Negative for behavioral problems.   Past Medical History  Diagnosis Date  . HYPERLIPIDEMIA 07/31/2007  . ANXIETY 11/07/2006  . HYPERTENSION 11/07/2006  . OSTEOARTHRITIS 11/07/2006    History   Social History  . Marital Status: Married    Spouse Name: N/A    Number of Children: N/A  . Years of Education: N/A   Occupational History  . Not on file.   Social History Main Topics  . Smoking status: Never Smoker   . Smokeless tobacco: Never Used  . Alcohol Use: No  . Drug Use: No  . Sexually Active: Yes   Other Topics Concern  . Not on file   Social History Narrative  . No narrative on file    Past Surgical History  Procedure Laterality Date  . Hernia repair      2001 Dr [place] left groin    Family History  Problem Relation Age of Onset  . Heart disease Mother     pacer  . Hyperlipidemia Mother   . Hypertension Mother     No Known Allergies  Current Outpatient Prescriptions on File Prior to Visit  Medication Sig Dispense  Refill  . atenolol (TENORMIN) 100 MG tablet Take 0.5 tablets (50 mg total) by mouth daily.  45 tablet  3  . diazepam (VALIUM) 5 MG tablet TAKE 1 TABLET BY MOUTH EVERY 6 HOURS AS NEEDED  30 tablet  3  . fish oil-omega-3 fatty acids 1000 MG capsule Take 1 capsule (1 g total) by mouth 2 (two) times daily.      . QUEtiapine (SEROQUEL) 25 MG tablet TAKE 1/2 TABLET AT BEDTIME AS NEEDED FOR SLEEP  15 tablet  3  . Red Yeast Rice 600 MG CAPS Take 1 capsule (600 mg total) by mouth 2 (two) times daily.       No current facility-administered medications on file prior to visit.    BP 112/74  Pulse 68  Temp(Src) 98.3 F (36.8 C)  Resp 16  Ht 5\' 10"  (1.778 m)  Wt 175 lb (79.379 kg)  BMI 25.11 kg/m2       Objective:   Physical Exam  Nursing note and vitals reviewed. Constitutional: He appears well-developed and well-nourished.  HENT:  Head: Normocephalic and atraumatic.  Eyes: Conjunctivae are normal. Pupils are equal, round, and reactive to light.  Neck: Normal range of motion. Neck supple.  Cardiovascular: Normal rate and regular rhythm.   Pulmonary/Chest: Effort normal and breath sounds normal.  Abdominal: Soft. Bowel sounds are normal.          Assessment & Plan:  The patient is stable Content  to continue the blood pressure medications at the current dose Insomnia Stable with the seroquil Weight loss has been impactfull in controlling blood pressure

## 2012-06-10 ENCOUNTER — Other Ambulatory Visit: Payer: Self-pay | Admitting: Internal Medicine

## 2012-07-13 ENCOUNTER — Other Ambulatory Visit: Payer: Self-pay | Admitting: Internal Medicine

## 2012-09-25 ENCOUNTER — Other Ambulatory Visit: Payer: 59

## 2012-10-01 ENCOUNTER — Other Ambulatory Visit (INDEPENDENT_AMBULATORY_CARE_PROVIDER_SITE_OTHER): Payer: 59

## 2012-10-01 DIAGNOSIS — Z0000 Encounter for general adult medical examination without abnormal findings: Secondary | ICD-10-CM

## 2012-10-01 LAB — HEPATIC FUNCTION PANEL
Alkaline Phosphatase: 35 U/L — ABNORMAL LOW (ref 39–117)
Bilirubin, Direct: 0.2 mg/dL (ref 0.0–0.3)

## 2012-10-01 LAB — LIPID PANEL
Cholesterol: 184 mg/dL (ref 0–200)
HDL: 39.4 mg/dL (ref >39.00)
VLDL: 15.4 mg/dL (ref 0.0–40.0)

## 2012-10-01 LAB — CBC WITH DIFFERENTIAL/PLATELET
Basophils Absolute: 0 10*3/uL (ref 0.0–0.1)
Basophils Relative: 0.6 % (ref 0.0–3.0)
Eosinophils Absolute: 0.2 10*3/uL (ref 0.0–0.7)
Lymphocytes Relative: 23.3 % (ref 12.0–46.0)
MCHC: 33.8 g/dL (ref 30.0–36.0)
Neutrophils Relative %: 65.5 % (ref 43.0–77.0)
RBC: 4.7 Mil/uL (ref 4.22–5.81)
RDW: 13.8 % (ref 11.5–14.6)

## 2012-10-01 LAB — POCT URINALYSIS DIPSTICK
Bilirubin, UA: NEGATIVE
Ketones, UA: NEGATIVE
Leukocytes, UA: NEGATIVE

## 2012-10-01 LAB — BASIC METABOLIC PANEL
CO2: 27 mEq/L (ref 19–32)
Calcium: 9.3 mg/dL (ref 8.4–10.5)
Creatinine, Ser: 1.1 mg/dL (ref 0.4–1.5)
GFR: 75.6 mL/min (ref >60.00)

## 2012-10-17 ENCOUNTER — Ambulatory Visit (INDEPENDENT_AMBULATORY_CARE_PROVIDER_SITE_OTHER): Payer: 59 | Admitting: Internal Medicine

## 2012-10-17 ENCOUNTER — Encounter: Payer: Self-pay | Admitting: Internal Medicine

## 2012-10-17 VITALS — BP 130/80 | HR 68 | Temp 98.2°F | Resp 16 | Ht 70.0 in | Wt 176.0 lb

## 2012-10-17 DIAGNOSIS — I10 Essential (primary) hypertension: Secondary | ICD-10-CM

## 2012-10-17 DIAGNOSIS — M722 Plantar fascial fibromatosis: Secondary | ICD-10-CM

## 2012-10-17 DIAGNOSIS — E785 Hyperlipidemia, unspecified: Secondary | ICD-10-CM

## 2012-10-17 DIAGNOSIS — Z0000 Encounter for general adult medical examination without abnormal findings: Secondary | ICD-10-CM

## 2012-10-17 MED ORDER — OMEGA-3 KRILL OIL 300 MG PO CAPS: 1.0000 | ORAL_CAPSULE | Freq: Two times a day (BID) | ORAL | Status: DC

## 2012-10-17 NOTE — Progress Notes (Signed)
Subjective:    Patient ID: Nathan Young, male    DOB: [birth year], 62 y.o.   MRN: [id number]  HPI CPX discussion of plantar fascia HTN stable     Review of Systems  Constitutional: Negative for fever and fatigue.  HENT: Negative for hearing loss, congestion, neck pain and postnasal drip.   Eyes: Negative for discharge, redness and visual disturbance.  Respiratory: Negative for cough, shortness of breath and wheezing.   Cardiovascular: Negative for leg swelling.  Gastrointestinal: Negative for abdominal pain, constipation and abdominal distention.  Genitourinary: Negative for urgency and frequency.  Musculoskeletal: Negative for joint swelling and arthralgias.  Skin: Negative for color change and rash.  Neurological: Negative for weakness and light-headedness.  Hematological: Negative for adenopathy.  Psychiatric/Behavioral: Negative for behavioral problems.   Past Medical History  Diagnosis Date  . HYPERLIPIDEMIA 07/31/2007  . ANXIETY 11/07/2006  . HYPERTENSION 11/07/2006  . OSTEOARTHRITIS 11/07/2006    History   Social History  . Marital Status: Married    Spouse Name: N/A    Number of Children: N/A  . Years of Education: N/A   Occupational History  . Not on file.   Social History Main Topics  . Smoking status: Never Smoker   . Smokeless tobacco: Never Used  . Alcohol Use: No  . Drug Use: No  . Sexually Active: Yes   Other Topics Concern  . Not on file   Social History Narrative  . No narrative on file    Past Surgical History  Procedure Laterality Date  . Hernia repair      2001 Dr [place] left groin    Family History  Problem Relation Age of Onset  . Heart disease Mother     pacer  . Hyperlipidemia Mother   . Hypertension Mother     No Known Allergies  Current Outpatient Prescriptions on File Prior to Visit  Medication Sig Dispense Refill  . atenolol (TENORMIN) 100 MG tablet Take 0.5 tablets (50 mg total) by mouth daily.  45 tablet  3  .  diazepam (VALIUM) 5 MG tablet TAKE 1 TABLET BY MOUTH EVERY 6 HOURS AS NEEDED  30 tablet  3  . fish oil-omega-3 fatty acids 1000 MG capsule Take 1 capsule (1 g total) by mouth 2 (two) times daily.      . Red Yeast Rice 600 MG CAPS Take 1 capsule (600 mg total) by mouth 2 (two) times daily.      . QUEtiapine (SEROQUEL) 25 MG tablet TAKE 1/2 TABLET AT BEDTIME AS NEEDED FOR SLEEP  15 tablet  3   No current facility-administered medications on file prior to visit.    BP 130/80  Pulse 68  Temp(Src) 98.2 F (36.8 C)  Resp 16  Ht 5\' 10"  (1.778 m)  Wt 176 lb (79.833 kg)  BMI 25.25 kg/m2       Objective:   Physical Exam  Constitutional: He is oriented to person, place, and time. He appears well-developed and well-nourished.  HENT:  Head: Normocephalic and atraumatic.  Eyes: Conjunctivae are normal. Pupils are equal, round, and reactive to light.  Neck: Normal range of motion. Neck supple.  Cardiovascular: Normal rate and regular rhythm.   Murmur heard. Pulmonary/Chest: Effort normal and breath sounds normal.  Abdominal: Soft. Bowel sounds are normal.  Genitourinary: Rectum normal and prostate normal.  Musculoskeletal: He exhibits tenderness.  At the fascial pain  Neurological: He is alert and oriented to person, place, and time.  Skin: Skin is  warm and dry.          Assessment & Plan:   Patient presents for yearly preventative medicine examination.   all immunizations and health maintenance protocols were reviewed with the patient and they are up to date with these protocols.   screening laboratory values were reviewed with the patient including screening of hyperlipidemia PSA renal function and hepatic function.   There medications past medical history social history problem list and allergies were reviewed in detail.   Goals were established with regard to weight loss exercise diet in compliance with medications

## 2012-10-27 ENCOUNTER — Other Ambulatory Visit: Payer: Self-pay | Admitting: Internal Medicine

## 2012-12-02 ENCOUNTER — Other Ambulatory Visit: Payer: Self-pay | Admitting: Internal Medicine

## 2012-12-26 ENCOUNTER — Ambulatory Visit (INDEPENDENT_AMBULATORY_CARE_PROVIDER_SITE_OTHER): Payer: 59

## 2012-12-26 DIAGNOSIS — Z23 Encounter for immunization: Secondary | ICD-10-CM

## 2013-02-28 ENCOUNTER — Other Ambulatory Visit: Payer: Self-pay | Admitting: Internal Medicine

## 2013-04-07 ENCOUNTER — Other Ambulatory Visit (INDEPENDENT_AMBULATORY_CARE_PROVIDER_SITE_OTHER): Payer: 59

## 2013-04-07 DIAGNOSIS — E785 Hyperlipidemia, unspecified: Secondary | ICD-10-CM

## 2013-04-07 LAB — HEPATIC FUNCTION PANEL
ALT: 23 U/L (ref 0–53)
AST: 20 U/L (ref 0–37)
Albumin: 4 g/dL (ref 3.5–5.2)
Alkaline Phosphatase: 42 U/L (ref 39–117)
BILIRUBIN TOTAL: 0.6 mg/dL (ref 0.3–1.2)
Bilirubin, Direct: 0 mg/dL (ref 0.0–0.3)
TOTAL PROTEIN: 7.8 g/dL (ref 6.0–8.3)

## 2013-04-07 LAB — LIPID PANEL
CHOLESTEROL: 171 mg/dL (ref 0–200)
HDL: 38.8 mg/dL — AB (ref >39.00)
LDL Cholesterol: 119 mg/dL — ABNORMAL HIGH (ref 0–99)
TRIGLYCERIDES: 68 mg/dL (ref 0.0–149.0)
Total CHOL/HDL Ratio: 4
VLDL: 13.6 mg/dL (ref 0.0–40.0)

## 2013-04-08 ENCOUNTER — Other Ambulatory Visit: Payer: 59

## 2013-04-15 ENCOUNTER — Ambulatory Visit: Payer: 59 | Admitting: Internal Medicine

## 2013-04-22 ENCOUNTER — Encounter: Payer: Self-pay | Admitting: Internal Medicine

## 2013-04-22 ENCOUNTER — Ambulatory Visit (INDEPENDENT_AMBULATORY_CARE_PROVIDER_SITE_OTHER): Payer: 59 | Admitting: Internal Medicine

## 2013-04-22 VITALS — BP 124/80 | HR 72 | Temp 98.3°F | Resp 16 | Ht 70.0 in | Wt 181.0 lb

## 2013-04-22 DIAGNOSIS — I10 Essential (primary) hypertension: Secondary | ICD-10-CM

## 2013-04-22 DIAGNOSIS — E785 Hyperlipidemia, unspecified: Secondary | ICD-10-CM

## 2013-04-22 MED ORDER — ASPIRIN EC 81 MG PO TBEC: 81.0000 mg | DELAYED_RELEASE_TABLET | Freq: Every day | ORAL | Status: DC

## 2013-04-22 NOTE — Progress Notes (Signed)
Pre visit review using our clinic review tool, if applicable. No additional management support is needed unless otherwise documented below in the visit note. 

## 2013-04-22 NOTE — Progress Notes (Signed)
Subjective:    Patient ID: Nathan Young, male    DOB: Dec 10, 1950, 63 y.o.   MRN: [id number]  HPI Is a 63 year old male recently E. His brother died of acute coronary syndrome and we discussed risk factors for him.  He is a nonsmoker his blood pressure is controlled he is on cholesterol protocol.  We discussed adding an aspirin a day as an appropriate intervention.     Review of Systems  Constitutional: Negative for fever and fatigue.  HENT: Negative for congestion, hearing loss and postnasal drip.   Eyes: Negative for discharge, redness and visual disturbance.  Respiratory: Negative for cough, shortness of breath and wheezing.   Cardiovascular: Negative for leg swelling.  Gastrointestinal: Negative for abdominal pain, constipation and abdominal distention.  Genitourinary: Negative for urgency and frequency.  Musculoskeletal: Negative for arthralgias, joint swelling and neck pain.  Skin: Negative for color change and rash.  Neurological: Negative for weakness and light-headedness.  Hematological: Negative for adenopathy.  Psychiatric/Behavioral: Negative for behavioral problems.   Past Medical History  Diagnosis Date  . HYPERLIPIDEMIA 07/31/2007  . ANXIETY 11/07/2006  . HYPERTENSION 11/07/2006  . OSTEOARTHRITIS 11/07/2006    History   Social History  . Marital Status: Married    Spouse Name: N/A    Number of Children: N/A  . Years of Education: N/A   Occupational History  . Not on file.   Social History Main Topics  . Smoking status: Never Smoker   . Smokeless tobacco: Never Used  . Alcohol Use: No  . Drug Use: No  . Sexual Activity: Yes   Other Topics Concern  . Not on file   Social History Narrative  . No narrative on file    Past Surgical History  Procedure Laterality Date  . Hernia repair      2001 Dr Lennie [place] left groin    Family History  Problem Relation Age of Onset  . Heart disease Mother     pacer  . Hyperlipidemia Mother   .  Hypertension Mother     No Known Allergies  Current Outpatient Prescriptions on File Prior to Visit  Medication Sig Dispense Refill  . atenolol (TENORMIN) 100 MG tablet Take 0.5 tablets (50 mg total) by mouth daily.  45 tablet  3  . diazepam (VALIUM) 5 MG tablet TAKE 1 TABLET BY MOUTH EVERY 6 HOURS  30 tablet  3  . QUEtiapine (SEROQUEL) 25 MG tablet TAKE 1/2 TABLET BY MOUTH AT BEDTIME AS NEEDED FOR SLEEP  45 tablet  3  . Red Yeast Rice 600 MG CAPS Take 1 capsule (600 mg total) by mouth 2 (two) times daily.       No current facility-administered medications on file prior to visit.    BP 124/80  Pulse 72  Temp(Src) 98.3 F (36.8 C)  Resp 16  Ht 5\' 10"  (1.778 m)  Wt 181 lb (82.101 kg)  BMI 25.97 kg/m2       Objective:   Physical Exam  Constitutional: He appears well-developed and well-nourished.  HENT:  Head: Normocephalic and atraumatic.  Eyes: Conjunctivae are normal. Pupils are equal, round, and reactive to light.  Neck: Normal range of motion. Neck supple.  Cardiovascular: Normal rate and regular rhythm.   Murmur heard. Pulmonary/Chest: Effort normal and breath sounds normal.  Abdominal: Soft. Bowel sounds are normal.          Assessment & Plan:  We are recommending an 81 mg aspirin every day  Blood pressure.  Taking fish oil and red rice 3 spray   The beta blocker.

## 2013-04-22 NOTE — Patient Instructions (Signed)
The patient is instructed to continue all medications as prescribed. Schedule followup with check out clerk upon leaving the clinic  

## 2013-04-27 ENCOUNTER — Telehealth: Payer: Self-pay | Admitting: Internal Medicine

## 2013-04-27 NOTE — Telephone Encounter (Signed)
Relevant patient education mailed to patient.  

## 2013-07-27 ENCOUNTER — Other Ambulatory Visit: Payer: Self-pay | Admitting: Internal Medicine

## 2013-09-16 ENCOUNTER — Telehealth: Payer: Self-pay | Admitting: *Deleted

## 2013-09-16 NOTE — Telephone Encounter (Signed)
I was a patient a couple of years ago diagnosed with Plantar Fasciitis.  Can I purchase another one of those braces?  The one I have has gotten worn.  I returned his call and informed him the brace costs $50 and he's welcome to come by to pick one up.  He stated that he would come by to pick one up in the next day or two.  I asked him what size shoe he wears.  He stated a size 12.  I told him I would leave it up front at check out.

## 2013-09-17 DIAGNOSIS — M722 Plantar fascial fibromatosis: Secondary | ICD-10-CM

## 2013-11-06 ENCOUNTER — Other Ambulatory Visit: Payer: 59

## 2013-11-13 ENCOUNTER — Encounter: Payer: 59 | Admitting: Internal Medicine

## 2013-11-20 ENCOUNTER — Other Ambulatory Visit: Payer: 59

## 2013-12-10 ENCOUNTER — Encounter: Payer: Self-pay | Admitting: Family Medicine

## 2013-12-10 ENCOUNTER — Ambulatory Visit (INDEPENDENT_AMBULATORY_CARE_PROVIDER_SITE_OTHER): Payer: Medicare Other | Admitting: Family Medicine

## 2013-12-10 VITALS — BP 118/88 | HR 64 | Temp 97.6°F | Wt 180.0 lb

## 2013-12-10 DIAGNOSIS — Z23 Encounter for immunization: Secondary | ICD-10-CM

## 2013-12-10 DIAGNOSIS — I10 Essential (primary) hypertension: Secondary | ICD-10-CM

## 2013-12-10 DIAGNOSIS — F411 Generalized anxiety disorder: Secondary | ICD-10-CM

## 2013-12-10 DIAGNOSIS — E785 Hyperlipidemia, unspecified: Secondary | ICD-10-CM

## 2013-12-10 LAB — COMPREHENSIVE METABOLIC PANEL
ALBUMIN: 4.2 g/dL (ref 3.5–5.2)
ALK PHOS: 39 U/L (ref 39–117)
ALT: 16 U/L (ref 0–53)
AST: 20 U/L (ref 0–37)
BILIRUBIN TOTAL: 0.8 mg/dL (ref 0.2–1.2)
BUN: 15 mg/dL (ref 6–23)
CO2: 26 mEq/L (ref 19–32)
Calcium: 9.1 mg/dL (ref 8.4–10.5)
Chloride: 106 mEq/L (ref 96–112)
Creatinine, Ser: 1.2 mg/dL (ref 0.4–1.5)
GFR: 62.75 mL/min (ref >60.00)
GLUCOSE: 96 mg/dL (ref 70–99)
POTASSIUM: 4.2 meq/L (ref 3.5–5.1)
SODIUM: 138 meq/L (ref 135–145)
TOTAL PROTEIN: 8 g/dL (ref 6.0–8.3)

## 2013-12-10 LAB — LDL CHOLESTEROL, DIRECT: LDL DIRECT: 142.3 mg/dL

## 2013-12-10 MED ORDER — ATENOLOL 100 MG PO TABS: 50.0000 mg | ORAL_TABLET | Freq: Every day | ORAL | Status: DC

## 2013-12-10 MED ORDER — DIAZEPAM 5 MG PO TABS: ORAL_TABLET | ORAL | Status: DC

## 2013-12-10 MED ORDER — QUETIAPINE FUMARATE 25 MG PO TABS: ORAL_TABLET | ORAL | Status: DC

## 2013-12-10 NOTE — Assessment & Plan Note (Signed)
Well-controlled on atenolol 100 mg. Continue.

## 2013-12-10 NOTE — Assessment & Plan Note (Addendum)
Well-controlled. Has tried multitude of therapies in the past. See my history of present illness from today. Doing well on Seroquel 12.5 mg and Valium 2.5 mg nightly to help him sleep. Discussed increased mortality potentially on Seroquel but due to benefits in sleep patient would like to continue.

## 2013-12-10 NOTE — Assessment & Plan Note (Signed)
10 year risk 13.3% without red yeast rice. Suspect mild reduction of wrist on red yeast rice. Discussed with risk elevates over 15% would discuss prescription statin

## 2013-12-10 NOTE — Patient Instructions (Addendum)
Things looked great today!   Labs today. Call with results.   See you back in 6 months  Health Maintenance Due  Topic Date Due  . Influenza Vaccine -next few months 10/24/2013  . Pneumococcal Polysaccharide- today  11/02/2013

## 2013-12-10 NOTE — Progress Notes (Signed)
Nathan Reddish, MD Phone: [phone]  Subjective:  Patient presents today to establish care with me as their new primary care provider. Patient was formerly a patient of Dr. Arnoldo Morale. Chief complaint-noted.   Hypertension-stable  BP Readings from Last 3 Encounters:  12/10/13 118/88  04/22/13 124/80  10/17/12 130/80  Home BP monitoring-no Compliant with medications-yes without side effects, atenolol ROS-Denies any CP, HA, SOB, blurry vision, LE edema.   Hyperlipidemia-stable, not ideal control  Lab Results  Component Value Date   LDLCALC 119* 04/07/2013  On statin: red yeast rice and fish oil 10 year risk: 13.3% but likely lower on red yeast rice Regular exercise: very active with work in [employer] ROS- no chest pain or shortness of breath. No myalgias  Anxiety/Insomnia-stable Seroquel for 10+ years and works well with sleep. Tried CPAP, melatonin, benadryl, ambien.  Diazepam 5mg  pill-splits pill and takes 1/2 tab to help him sleep. Doing well on regimen.  Does not take diazepam to help him with anxiety.  ROS- No SI HI.   The following were reviewed and entered/updated in epic: Past Medical History  Diagnosis Date  . HYPERLIPIDEMIA 07/31/2007  . ANXIETY 11/07/2006  . HYPERTENSION 11/07/2006  . OSTEOARTHRITIS 11/07/2006   Patient Active Problem List   Diagnosis Date Noted  . HYPERLIPIDEMIA 07/31/2007    Priority: Medium  . Insomnia 11/07/2006    Priority: Medium  . HYPERTENSION 11/07/2006    Priority: Medium  . Plantar fasciitis of left foot 05/16/2011    Priority: Low  . OSTEOARTHRITIS 11/07/2006    Priority: Low   Past Surgical History  Procedure Laterality Date  . Hernia repair      2001 Dr Lennie Hummer left groin; right 2012  . Cataract extraction      bilateral    Family History  Problem Relation Age of Onset  . Heart disease Mother     pacer; MI late 51s or early 34s  . Hyperlipidemia Mother   . Hypertension Mother     Medications- reviewed and  updated Current Outpatient Prescriptions  Medication Sig Dispense Refill  . atenolol (TENORMIN) 100 MG tablet Take 0.5 tablets (50 mg total) by mouth daily.  45 tablet  3  . diazepam (VALIUM) 5 MG tablet TAKE 1/2-1 TABLET BY MOUTH as needed for sleep  30 tablet  3  . Omega-3 Fatty Acids (FISH OIL) 1000 MG CAPS Take 1 capsule by mouth daily.      . QUEtiapine (SEROQUEL) 25 MG tablet TAKE 1/2 TABLET BY MOUTH AT BEDTIME AS NEEDED FOR SLEEP  45 tablet  3  . Red Yeast Rice 600 MG CAPS Take 1 capsule (600 mg total) by mouth 2 (two) times daily.      Marland Kitchen aspirin EC 81 MG tablet Take 1 tablet (81 mg total) by mouth daily.  30 tablet     No current facility-administered medications for this visit.    Allergies-reviewed and updated No Known Allergies  History   Social History  . Marital Status: Married    Spouse Name: N/A    Number of Children: N/A  . Years of Education: N/A   Social History Main Topics  . Smoking status: Never Smoker   . Smokeless tobacco: Never Used  . Alcohol Use: No  . Drug Use: No  . Sexual Activity: Yes   Other Topics Concern  . None   Social History Narrative   Married 1977. [place]. [place]. 2 grandkids from [place] 2005, [place] 2007.  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     ROS--See HPI   Objective: BP 118/88  Pulse 64  Temp(Src) 97.6 F (36.4 C)  Wt 180 lb (81.647 kg) Gen: NAD, resting comfortably on table HEENT: Mucous membranes are moist. Oropharynx normal. Has most of his teeth remaining.  Neck: no thyromegaly CV: RRR no murmurs rubs or gallops Lungs: CTAB no crackles, wheeze, rhonchi Abdomen: soft/nontender/nondistended/normal bowel sounds. Ext: no edema, 2+ PT pulses Skin: warm, dry, no rash Neuro: grossly normal, moves all extremities, PERRLA  Assessment/Plan:  HYPERTENSION Well-controlled on atenolol 100 mg. Continue.  HYPERLIPIDEMIA 10 year risk 13.3%  without red yeast rice. Suspect mild reduction of wrist on red yeast rice. Discussed with risk elevates over 15% would discuss prescription statin  Insomnia Well-controlled. Has tried multitude of therapies in the past. See my history of present illness from today. Doing well on Seroquel 12.5 mg and Valium 2.5 mg nightly to help him sleep. Discussed increased mortality potentially on Seroquel but due to benefits in sleep patient would like to continue.   Health Maintenance Due  Topic Date Due  . Influenza Vaccine  10/24/2013   Orders Placed This Encounter  Procedures  . Pneumococcal polysaccharide vaccine 23-valent greater than or equal to 2yo subcutaneous/IM  . Comprehensive metabolic panel    [facility]  . LDL cholesterol, direct    [place]   Meds ordered this encounter  Medications  . diazepam (VALIUM) 5 MG tablet    Sig: TAKE 1/2-1 TABLET BY MOUTH as needed for sleep    Dispense:  30 tablet    Refill:  3  . atenolol (TENORMIN) 100 MG tablet    Sig: Take 0.5 tablets (50 mg total) by mouth daily.    Dispense:  45 tablet    Refill:  3  . QUEtiapine (SEROQUEL) 25 MG tablet    Sig: TAKE 1/2 TABLET BY MOUTH AT BEDTIME AS NEEDED FOR SLEEP    Dispense:  45 tablet    Refill:  3

## 2013-12-16 ENCOUNTER — Telehealth: Payer: Self-pay | Admitting: Family Medicine

## 2013-12-16 NOTE — Telephone Encounter (Signed)
LMOM for pt to call the office.  Pt's PA for Diazepam was approved.

## 2013-12-31 ENCOUNTER — Ambulatory Visit (INDEPENDENT_AMBULATORY_CARE_PROVIDER_SITE_OTHER): Payer: Medicare Other

## 2013-12-31 DIAGNOSIS — Z23 Encounter for immunization: Secondary | ICD-10-CM

## 2014-06-10 ENCOUNTER — Ambulatory Visit: Payer: Medicare Other | Admitting: Family Medicine

## 2014-07-12 ENCOUNTER — Other Ambulatory Visit: Payer: Self-pay | Admitting: Family Medicine

## 2014-07-12 NOTE — Telephone Encounter (Signed)
Ok to refill 

## 2014-07-12 NOTE — Telephone Encounter (Signed)
yes

## 2014-10-17 ENCOUNTER — Other Ambulatory Visit: Payer: Self-pay | Admitting: Family Medicine

## 2014-10-18 NOTE — Telephone Encounter (Signed)
Refill ok? 

## 2014-10-18 NOTE — Telephone Encounter (Signed)
Yes thanks 

## 2015-01-03 ENCOUNTER — Ambulatory Visit (INDEPENDENT_AMBULATORY_CARE_PROVIDER_SITE_OTHER): Payer: Medicare Other

## 2015-01-03 DIAGNOSIS — Z23 Encounter for immunization: Secondary | ICD-10-CM | POA: Diagnosis not present

## 2015-01-11 ENCOUNTER — Other Ambulatory Visit: Payer: Self-pay | Admitting: Family Medicine

## 2015-01-11 NOTE — Telephone Encounter (Signed)
Refill ok? 

## 2015-01-11 NOTE — Telephone Encounter (Signed)
Cannot tell what needs to be refilled on request. sorry

## 2015-01-28 ENCOUNTER — Other Ambulatory Visit (INDEPENDENT_AMBULATORY_CARE_PROVIDER_SITE_OTHER): Payer: Medicare Other

## 2015-01-28 DIAGNOSIS — Z125 Encounter for screening for malignant neoplasm of prostate: Secondary | ICD-10-CM | POA: Diagnosis not present

## 2015-01-28 DIAGNOSIS — I10 Essential (primary) hypertension: Secondary | ICD-10-CM | POA: Diagnosis not present

## 2015-01-28 DIAGNOSIS — Z0000 Encounter for general adult medical examination without abnormal findings: Secondary | ICD-10-CM

## 2015-01-28 DIAGNOSIS — E785 Hyperlipidemia, unspecified: Secondary | ICD-10-CM

## 2015-01-28 LAB — BASIC METABOLIC PANEL
BUN: 13 mg/dL (ref 6–23)
CALCIUM: 9.4 mg/dL (ref 8.4–10.5)
CO2: 30 meq/L (ref 19–32)
CREATININE: 1.18 mg/dL (ref 0.40–1.50)
Chloride: 103 mEq/L (ref 96–112)
GFR: 65.6 mL/min (ref >60.00)
Glucose, Bld: 93 mg/dL (ref 70–99)
Potassium: 4.9 mEq/L (ref 3.5–5.1)
SODIUM: 139 meq/L (ref 135–145)

## 2015-01-28 LAB — POCT URINALYSIS DIPSTICK
Bilirubin, UA: NEGATIVE
Glucose, UA: NEGATIVE
KETONES UA: NEGATIVE
Leukocytes, UA: NEGATIVE
Nitrite, UA: NEGATIVE
RBC UA: NEGATIVE
SPEC GRAV UA: 1.015
Urobilinogen, UA: 1
pH, UA: 7.5

## 2015-01-28 LAB — LIPID PANEL
Cholesterol: 170 mg/dL (ref 0–200)
HDL: 33.7 mg/dL — ABNORMAL LOW (ref >39.00)
LDL Cholesterol: 116 mg/dL — ABNORMAL HIGH (ref 0–99)
NONHDL: 136.3
Total CHOL/HDL Ratio: 5
Triglycerides: 100 mg/dL (ref 0.0–149.0)
VLDL: 20 mg/dL (ref 0.0–40.0)

## 2015-01-28 LAB — CBC WITH DIFFERENTIAL/PLATELET
BASOS ABS: 0 10*3/uL (ref 0.0–0.1)
Basophils Relative: 0.7 % (ref 0.0–3.0)
EOS ABS: 0.2 10*3/uL (ref 0.0–0.7)
Eosinophils Relative: 3.2 % (ref 0.0–5.0)
HEMATOCRIT: 48.6 % (ref 39.0–52.0)
HEMOGLOBIN: 16.3 g/dL (ref 13.0–17.0)
LYMPHS PCT: 24.2 % (ref 12.0–46.0)
Lymphs Abs: 1.6 10*3/uL (ref 0.7–4.0)
MCHC: 33.5 g/dL (ref 30.0–36.0)
MCV: 98.5 fl (ref 78.0–100.0)
MONOS PCT: 7.8 % (ref 3.0–12.0)
Monocytes Absolute: 0.5 10*3/uL (ref 0.1–1.0)
Neutro Abs: 4.2 10*3/uL (ref 1.4–7.7)
Neutrophils Relative %: 64.1 % (ref 43.0–77.0)
Platelets: 294 10*3/uL (ref 150.0–400.0)
RBC: 4.93 Mil/uL (ref 4.22–5.81)
RDW: 13.5 % (ref 11.5–15.5)
WBC: 6.6 10*3/uL (ref 4.0–10.5)

## 2015-01-28 LAB — HEPATIC FUNCTION PANEL
ALBUMIN: 4.1 g/dL (ref 3.5–5.2)
ALK PHOS: 45 U/L (ref 39–117)
ALT: 17 U/L (ref 0–53)
AST: 19 U/L (ref 0–37)
BILIRUBIN DIRECT: 0.2 mg/dL (ref 0.0–0.3)
TOTAL PROTEIN: 7.3 g/dL (ref 6.0–8.3)
Total Bilirubin: 0.7 mg/dL (ref 0.2–1.2)

## 2015-01-28 LAB — PSA: PSA: 1.31 ng/mL (ref 0.10–4.00)

## 2015-01-28 LAB — TSH: TSH: 1.57 u[IU]/mL (ref 0.35–4.50)

## 2015-02-04 ENCOUNTER — Encounter: Payer: Medicare Other | Admitting: Family Medicine

## 2015-02-08 ENCOUNTER — Ambulatory Visit (INDEPENDENT_AMBULATORY_CARE_PROVIDER_SITE_OTHER): Payer: Medicare Other | Admitting: Family Medicine

## 2015-02-08 ENCOUNTER — Encounter: Payer: Self-pay | Admitting: Family Medicine

## 2015-02-08 VITALS — BP 114/88 | HR 70 | Temp 97.6°F | Ht 70.0 in | Wt 182.0 lb

## 2015-02-08 DIAGNOSIS — Z0000 Encounter for general adult medical examination without abnormal findings: Secondary | ICD-10-CM

## 2015-02-08 DIAGNOSIS — Z23 Encounter for immunization: Secondary | ICD-10-CM | POA: Diagnosis not present

## 2015-02-08 MED ORDER — DIAZEPAM 5 MG PO TABS: ORAL_TABLET | ORAL | Status: DC

## 2015-02-08 MED ORDER — LOVASTATIN 20 MG PO TABS: 20.0000 mg | ORAL_TABLET | Freq: Every day | ORAL | Status: DC

## 2015-02-08 NOTE — Assessment & Plan Note (Signed)
on red yeast rice LDL 116. 10 year risk 16.2%. Start lovastatin 20mg  Lab Results  Component Value Date   CHOL 170 01/28/2015   HDL 33.70* 01/28/2015   LDLCALC 116* 01/28/2015   LDLDIRECT 142.3 12/10/2013   TRIG 100.0 01/28/2015   CHOLHDL 5 01/28/2015

## 2015-02-08 NOTE — Patient Instructions (Addendum)
Finally pnuemonia shot received today [id number]). Prior immunizations Immunization History  Administered Date(s) Administered  . Influenza Split 02/02/2011, 01/15/2012  . Influenza Whole 12/20/2007, 01/27/2009, 03/01/2010  . Influenza,inj,Quad PF,36+ Mos 12/26/2012, 12/31/2013, 01/03/2015  . Pneumococcal Conjugate-13 02/08/2015  . Pneumococcal Polysaccharide-23 12/10/2013  . Td 03/26/2000  . Tdap 10/10/2011  . Zoster 03/01/2010   Advise 6 month follow up so we can see how you are doing on cholesterol medicine as well as recheck blood pressure since it was up on first check  Absolutely need to see you in a year

## 2015-02-08 NOTE — Progress Notes (Signed)
Nathan Reddish, MD Phone: [phone]  Subjective:  Patient presents today for their annual physical. Chief complaint-noted.   -overall doing well. Main concern is cholesterol- patient prefers not to be on statin due to wife SE likely myalgias but willing to trial ROS- full  review of systems was completed and negative including no chest pain, shortness of breath, diaphoresis, left arm or hand pain  The following were reviewed and entered/updated in epic: Past Medical History  Diagnosis Date  . HYPERLIPIDEMIA 07/31/2007  . ANXIETY 11/07/2006  . HYPERTENSION 11/07/2006  . OSTEOARTHRITIS 11/07/2006   Patient Active Problem List   Diagnosis Date Noted  . HYPERLIPIDEMIA 07/31/2007    Priority: Medium  . Insomnia 11/07/2006    Priority: Medium  . HYPERTENSION 11/07/2006    Priority: Medium  . Plantar fasciitis of left foot 05/16/2011    Priority: Low  . OSTEOARTHRITIS 11/07/2006    Priority: Low   Past Surgical History  Procedure Laterality Date  . Hernia repair      2001 Dr Lennie Hummer left groin; right 2012  . Cataract extraction      bilateral    Family History  Problem Relation Age of Onset  . Heart disease Mother     pacer; MI late 38s or early 36s  . Hyperlipidemia Mother   . Hypertension Mother     Medications- reviewed and updated Current Outpatient Prescriptions  Medication Sig Dispense Refill  . aspirin EC 81 MG tablet Take 1 tablet (81 mg total) by mouth daily. 30 tablet   . atenolol (TENORMIN) 100 MG tablet TAKE 1/2 TABLET BY MOUTH DAILY 45 tablet 3  . Omega-3 Fatty Acids (FISH OIL) 1000 MG CAPS Take 1 capsule by mouth daily.    . Red Yeast Rice 600 MG CAPS Take 1 capsule (600 mg total) by mouth 2 (two) times daily.    . diazepam (VALIUM) 5 MG tablet TAKE 1/2-1 TABLET BY MOUTH AS NEEDED FOR SLEEP (Patient not taking: Reported on 02/08/2015) 30 tablet 2  . QUEtiapine (SEROQUEL) 25 MG tablet TAKE 1/2 TABLET BY MOUTH AT BEDTIME AS NEEDED FOR SLEEP (Patient not  taking: Reported on 02/08/2015) 45 tablet 3   No current facility-administered medications for this visit.    Allergies-reviewed and updated No Known Allergies  Social History   Social History  . Marital Status: Married    Spouse Name: N/A  . Number of Children: N/A  . Years of Education: N/A   Social History Main Topics  . Smoking status: Never Smoker   . Smokeless tobacco: Never Used  . Alcohol Use: No  . Drug Use: No  . Sexual Activity: Yes   Other Topics Concern  . Not on file   Social History Narrative   Married 1977. [place]. [place]. 2 grandkids from [place] 2005, [place] 2007.       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     ROS--See HPI   Objective: BP 122/92 mmHg  Pulse 70  Temp(Src) 97.6 F (36.4 C)  Ht 5\' 10"  (1.778 m)  Wt 182 lb (82.555 kg)  BMI 26.11 kg/m2 Gen: NAD, resting comfortably HEENT: Mucous membranes are moist. Oropharynx normal Neck: no thyromegaly CV: RRR no murmurs rubs or gallops Lungs: CTAB no crackles, wheeze, rhonchi Abdomen: soft/nontender/nondistended/normal bowel sounds. No rebound or guarding.  Rectal: normal tone, diffusely enlarged prostate, no masses or tenderness (does have some nocturia) Ext: no edema  Skin: warm, dry Neuro: grossly normal, moves all extremities, PERRLA  Assessment/Plan:  63 y.o. male presenting for annual physical.  Health Maintenance counseling: 1. Anticipatory guidance: Patient counseled regarding regular dental exams, eye doctor, wearing seatbelts.  2. Risk factor reduction:  Advised patient of need for regular exercise and diet rich and fruits and vegetables to reduce risk of heart attack and stroke.  3. Immunizations/screenings/ancillary studies Health Maintenance Due  Topic Date Due  . Hepatitis C Screening - declined testing 1950/08/11  4. Prostate cancer screening- low risk based off PSA and rectal   Lab Results  Component  Value Date   PSA 1.31 01/28/2015   PSA 1.26 10/01/2012   PSA 1.80 10/01/2011  5. Colon cancer screening - due 04/12/16 as done in 2008 6. Skin cancer screening- [facility] dermatology as needed  Hypertension on atenolol 100mg  controlled BP Readings from Last 3 Encounters:  02/08/15 114/88  12/10/13 118/88  04/22/13 124/80   Hyperlipidemia on red yeast rice LDL 116. 10 year risk 16.2%. Start lovastatin 20mg  Lab Results  Component Value Date   CHOL 170 01/28/2015   HDL 33.70* 01/28/2015   LDLCALC 116* 01/28/2015   LDLDIRECT 142.3 12/10/2013   TRIG 100.0 01/28/2015   CHOLHDL 5 01/28/2015     6 months advised  Orders Placed This Encounter  Procedures  . Pneumococcal conjugate vaccine 13-valent

## 2015-05-12 ENCOUNTER — Telehealth: Payer: Self-pay | Admitting: Family Medicine

## 2015-05-12 NOTE — Telephone Encounter (Signed)
LM with pt wife to have pt return my call when he gets back in.

## 2015-05-12 NOTE — Telephone Encounter (Signed)
Pt had been having aches and pains in legs mostly and at night. Pt spoke with pharmacist and they advised it could be a side effect of the lovastatin (MEVACOR) 20 MG tablet  Pt would like to know if you will recommend another rx? Pt quit taking them the last 3-4 weeks and now he is better.  But now feels he may need to be on something. Pt continues to take fish oil.  [facility] [address]

## 2015-05-12 NOTE — Telephone Encounter (Signed)
Spoke with pt and gave him below message.

## 2015-05-12 NOTE — Telephone Encounter (Signed)
Have him remain off for a week then restart every other day and see if recurrence of issue

## 2015-05-12 NOTE — Telephone Encounter (Signed)
See below

## 2015-06-22 ENCOUNTER — Telehealth: Payer: Self-pay | Admitting: Family Medicine

## 2015-06-22 MED ORDER — PITAVASTATIN CALCIUM 1 MG PO TABS
1.0000 | ORAL_TABLET | Freq: Every day | ORAL | Status: DC
Start: 2015-06-22 — End: 2015-08-08

## 2015-06-22 NOTE — Telephone Encounter (Signed)
Patient came in today stating that he still have the leg aches mostly at night. Patient did as MD stated at switched to every other day and patient would like to have his medication switched due to these side effects . Patient is currently taking lovastatin (MEVACOR) 20 MG tablet.

## 2015-06-22 NOTE — Telephone Encounter (Signed)
Lets try low dose livalo/pitavastatin 1mg  daily and see if it better (can be more expensive but usually has less muscle aches)

## 2015-06-22 NOTE — Telephone Encounter (Signed)
See below

## 2015-06-23 NOTE — Telephone Encounter (Signed)
Pt.notified

## 2015-07-27 ENCOUNTER — Other Ambulatory Visit: Payer: Self-pay | Admitting: Family Medicine

## 2015-07-28 NOTE — Telephone Encounter (Signed)
Yes thanks 

## 2015-07-28 NOTE — Telephone Encounter (Signed)
Refill ok? 

## 2015-08-08 ENCOUNTER — Encounter: Payer: Self-pay | Admitting: Family Medicine

## 2015-08-08 ENCOUNTER — Ambulatory Visit (INDEPENDENT_AMBULATORY_CARE_PROVIDER_SITE_OTHER): Payer: Medicare Other | Admitting: Family Medicine

## 2015-08-08 VITALS — BP 116/86 | HR 64 | Temp 97.4°F | Ht 70.0 in | Wt 184.0 lb

## 2015-08-08 DIAGNOSIS — I10 Essential (primary) hypertension: Secondary | ICD-10-CM | POA: Diagnosis not present

## 2015-08-08 DIAGNOSIS — G4700 Insomnia, unspecified: Secondary | ICD-10-CM

## 2015-08-08 DIAGNOSIS — E785 Hyperlipidemia, unspecified: Secondary | ICD-10-CM

## 2015-08-08 DIAGNOSIS — M1711 Unilateral primary osteoarthritis, right knee: Secondary | ICD-10-CM | POA: Diagnosis not present

## 2015-08-08 DIAGNOSIS — L570 Actinic keratosis: Secondary | ICD-10-CM | POA: Diagnosis not present

## 2015-08-08 LAB — COMPREHENSIVE METABOLIC PANEL
ALBUMIN: 4.5 g/dL (ref 3.5–5.2)
ALT: 22 U/L (ref 0–53)
AST: 19 U/L (ref 0–37)
Alkaline Phosphatase: 40 U/L (ref 39–117)
BUN: 13 mg/dL (ref 6–23)
CALCIUM: 9.4 mg/dL (ref 8.4–10.5)
CHLORIDE: 104 meq/L (ref 96–112)
CO2: 27 meq/L (ref 19–32)
CREATININE: 1.05 mg/dL (ref 0.40–1.50)
GFR: 74.93 mL/min (ref >60.00)
Glucose, Bld: 93 mg/dL (ref 70–99)
POTASSIUM: 5.1 meq/L (ref 3.5–5.1)
Sodium: 137 mEq/L (ref 135–145)
Total Bilirubin: 0.7 mg/dL (ref 0.2–1.2)
Total Protein: 7.7 g/dL (ref 6.0–8.3)

## 2015-08-08 LAB — LDL CHOLESTEROL, DIRECT: Direct LDL: 111 mg/dL

## 2015-08-08 NOTE — Assessment & Plan Note (Signed)
S: poorly controlled on no therapy at last visit- we started lovastatin 20mg  so suspect improved control. No myalgias. Had trialed pitavastatin but was too expensive- previously he had myalgias in legs Lab Results  Component Value Date   CHOL 170 01/28/2015   HDL 33.70* 01/28/2015   LDLCALC 116* 01/28/2015   LDLDIRECT 142.3 12/10/2013   TRIG 100.0 01/28/2015   CHOLHDL 5 01/28/2015   A/P: update direct LDL today, patient came fasting

## 2015-08-08 NOTE — Assessment & Plan Note (Signed)
S: well controlled with seroquel 12.5mg  and valium 2.5mg  for sleep A/P: continue current medications, patient aware of increased mortality risk

## 2015-08-08 NOTE — Assessment & Plan Note (Signed)
S: noted lesion on left ear with scale to it and erythematous base- growing slightly. Similar to lesion on right ear previously treated by Dr. Allyson Sabal with cyrotherapy A/P: Cryotherapy completed today to left ear AK with after care instructions

## 2015-08-08 NOTE — Progress Notes (Signed)
Subjective:  Nathan Young is a 65 y.o. year old very pleasant male patient who presents for/with See problem oriented charting ROS- No chest pain or shortness of breath. No headache or blurry vision.see any ROS included in HPI as well.   Past Medical History-  Patient Active Problem List   Diagnosis Date Noted  . Hyperlipidemia 07/31/2007    Priority: Medium  . Insomnia 11/07/2006    Priority: Medium  . Essential hypertension 11/07/2006    Priority: Medium  . Actinic keratosis 08/08/2015    Priority: Low  . Plantar fasciitis of left foot 05/16/2011    Priority: Low  . Osteoarthritis of right knee 11/07/2006    Priority: Low    Medications- reviewed and updated Current Outpatient Prescriptions  Medication Sig Dispense Refill  . aspirin EC 81 MG tablet Take 1 tablet (81 mg total) by mouth daily. 30 tablet   . atenolol (TENORMIN) 100 MG tablet TAKE 1/2 TABLET BY MOUTH DAILY 45 tablet 3  . diazepam (VALIUM) 5 MG tablet TAKE 1/2 TO 1 TABLET BY MOUTH DAILY AS NEEDED FOR SLEEP 30 tablet 5  . lovastatin (MEVACOR) 20 MG tablet Take 20 mg by mouth every morning.     . Omega-3 Fatty Acids (FISH OIL) 1000 MG CAPS Take 1 capsule by mouth daily.    . QUEtiapine (SEROQUEL) 25 MG tablet TAKE 1/2 TABLET BY MOUTH AT BEDTIME AS NEEDED FOR SLEEP 45 tablet 3   No current facility-administered medications for this visit.    Objective: BP 116/86 mmHg  Pulse 64  Temp(Src) 97.4 F (36.3 C) (Oral)  Ht 5\' 10"  (1.778 m)  Wt 184 lb (83.462 kg)  BMI 26.40 kg/m2 Gen: NAD, resting comfortably in chair CV: RRR no murmurs rubs or gallops Lungs: CTAB no crackles, wheeze, rhonchi Abdomen: soft/nontender/nondistended/normal bowel sounds. No rebound or guarding.  Ext: no edema Skin: warm, dry Above left ear there is a scaly rash on erythematous base Neuro: grossly normal, moves all extremities  Assessment/Plan:  Essential hypertension S: controlled on atenolol 100mg . Home diastolic running around  88, systolic looks great BP Readings from Last 3 Encounters:  08/08/15 116/86  02/08/15 114/88  12/10/13 118/88  A/P:Continue current meds:  Remains at goal  Hyperlipidemia S: poorly controlled on no therapy at last visit- we started lovastatin 20mg  so suspect improved control. No myalgias. Had trialed pitavastatin but was too expensive- previously he had myalgias in legs Lab Results  Component Value Date   CHOL 170 01/28/2015   HDL 33.70* 01/28/2015   LDLCALC 116* 01/28/2015   LDLDIRECT 142.3 12/10/2013   TRIG 100.0 01/28/2015   CHOLHDL 5 01/28/2015   A/P: update direct LDL today, patient came fasting  Insomnia S: well controlled with seroquel 12.5mg  and valium 2.5mg  for sleep A/P: continue current medications, patient aware of increased mortality risk  Osteoarthritis of right knee S: alusio repair meniscus of meniscus. r knee 15 years ago received shots. Mild pain at times with walking A/P:  Discussed OA diagnosis, option of injectoin but mild enough patient does not want to proceed. Discussed icing regimen. Uses nsaids some and warned patient of cardiac risks here- the less the better  Actinic keratosis S: noted lesion on left ear with scale to it and erythematous base- growing slightly. Similar to lesion on right ear previously treated by Dr. Allyson Sabal with cyrotherapy A/P: Cryotherapy completed today to left ear AK with after care instructions    Return in about 6 months (around 02/08/2016) for physical. verbally  Return precautions advised.   Orders Placed This Encounter  Procedures  . LDL cholesterol, direct    [place]  . Comprehensive metabolic panel    [place]    Meds ordered this encounter  Medications  . lovastatin (MEVACOR) 20 MG tablet    Sig: Take 20 mg by mouth every morning.     Garret Reddish, MD

## 2015-08-08 NOTE — Assessment & Plan Note (Signed)
S: controlled on atenolol 100mg . Home diastolic running around 88, systolic looks great BP Readings from Last 3 Encounters:  08/08/15 116/86  02/08/15 114/88  12/10/13 118/88  A/P:Continue current meds:  Remains at goal

## 2015-08-08 NOTE — Assessment & Plan Note (Signed)
S: alusio repair meniscus of meniscus. r knee 15 years ago received shots. Mild pain at times with walking A/P:  Discussed OA diagnosis, option of injectoin but mild enough patient does not want to proceed. Discussed icing regimen. Uses nsaids some and warned patient of cardiac risks here- the less the better

## 2015-08-08 NOTE — Patient Instructions (Signed)
Update labs before you leave  No change in medication today  We froze a precancer on left ear. Should blister up and then skin should return to normal- may have some scarring and may take up to a month to heal. Follow up for recurrence or worsening

## 2015-08-09 ENCOUNTER — Telehealth: Payer: Self-pay | Admitting: Family Medicine

## 2015-08-09 ENCOUNTER — Other Ambulatory Visit: Payer: Self-pay | Admitting: *Deleted

## 2015-08-09 MED ORDER — ATORVASTATIN CALCIUM 20 MG PO TABS: 20.0000 mg | ORAL_TABLET | Freq: Every day | ORAL | Status: DC

## 2015-08-09 NOTE — Telephone Encounter (Signed)
Pt is returning abbie call °

## 2015-10-07 ENCOUNTER — Encounter: Payer: Self-pay | Admitting: Family Medicine

## 2015-10-07 ENCOUNTER — Ambulatory Visit (INDEPENDENT_AMBULATORY_CARE_PROVIDER_SITE_OTHER): Payer: Medicare Other | Admitting: Family Medicine

## 2015-10-07 VITALS — BP 108/80 | HR 63 | Temp 97.9°F | Ht 70.0 in | Wt 184.0 lb

## 2015-10-07 DIAGNOSIS — I10 Essential (primary) hypertension: Secondary | ICD-10-CM

## 2015-10-07 DIAGNOSIS — E785 Hyperlipidemia, unspecified: Secondary | ICD-10-CM | POA: Diagnosis not present

## 2015-10-07 LAB — LDL CHOLESTEROL, DIRECT: Direct LDL: 65 mg/dL

## 2015-10-07 NOTE — Progress Notes (Signed)
Subjective:  Nathan Young is a 65 y.o. year old very pleasant male patient who presents for/with See problem oriented charting ROS- No chest pain or shortness of breath. No headache or blurry vision.see any ROS included in HPI as well.   Past Medical History-  Patient Active Problem List   Diagnosis Date Noted  . Hyperlipidemia 07/31/2007    Priority: Medium  . Insomnia 11/07/2006    Priority: Medium  . Essential hypertension 11/07/2006    Priority: Medium  . Actinic keratosis 08/08/2015    Priority: Low  . Plantar fasciitis of left foot 05/16/2011    Priority: Low  . Osteoarthritis of right knee 11/07/2006    Priority: Low    Medications- reviewed and updated Current Outpatient Prescriptions  Medication Sig Dispense Refill  . aspirin EC 81 MG tablet Take 1 tablet (81 mg total) by mouth daily. 30 tablet   . atenolol (TENORMIN) 100 MG tablet TAKE 1/2 TABLET BY MOUTH DAILY 45 tablet 3  . atorvastatin (LIPITOR) 20 MG tablet Take 1 tablet (20 mg total) by mouth daily. 90 tablet 3  . diazepam (VALIUM) 5 MG tablet TAKE 1/2 TO 1 TABLET BY MOUTH DAILY AS NEEDED FOR SLEEP 30 tablet 5  . Omega-3 Fatty Acids (FISH OIL) 1000 MG CAPS Take 1 capsule by mouth daily.    . QUEtiapine (SEROQUEL) 25 MG tablet TAKE 1/2 TABLET BY MOUTH AT BEDTIME AS NEEDED FOR SLEEP 45 tablet 3   No current facility-administered medications for this visit.    Objective: BP 108/80 mmHg  Pulse 63  Temp(Src) 97.9 F (36.6 C) (Oral)  Ht 5\' 10"  (1.778 m)  Wt 184 lb (83.462 kg)  BMI 26.40 kg/m2  SpO2 97% Gen: NAD, resting comfortably CV: RRR no murmurs rubs or gallops Lungs: CTAB no crackles, wheeze, rhonchi Abdomen: soft/nontender/nondistended/normal bowel sounds. Ext: no edema Skin: warm, dry Neuro: grossly normal, moves all extremities  Assessment/Plan:  Essential hypertension S: poorly controlled on lovastatin 20mg  so increased to atorvastatin 40mg - myalgias in past. No myalgias.  Lab Results   Component Value Date   CHOL 170 01/28/2015   HDL 33.70* 01/28/2015   LDLCALC 116* 01/28/2015   LDLDIRECT 111.0 08/08/2015   TRIG 100.0 01/28/2015   CHOLHDL 5 01/28/2015   A/P: tolerating current medicine as long as takes medicine in morning. at night has myalgias if he takes it. Continue atorvastatin 40mg  and update direct LDL with goal <100  Hypertension S: controlled on atenolol 100mg . Home #s- has not been checking BP Readings from Last 3 Encounters:  10/07/15 108/80  08/08/15 116/86  02/08/15 114/88  A/P:Continue current meds:  Doing well  PHysical in october  Orders Placed This Encounter  Procedures  . LDL cholesterol, direct    [place]   Return precautions advised.  Garret Reddish, MD

## 2015-10-07 NOTE — Patient Instructions (Addendum)
No changes in medications  Update labs before you leave- hopeful bad cholesterol now under 100  See you in october

## 2015-10-07 NOTE — Assessment & Plan Note (Signed)
S: poorly controlled on lovastatin 20mg  so increased to atorvastatin 40mg - myalgias in past. No myalgias.  Lab Results  Component Value Date   CHOL 170 01/28/2015   HDL 33.70* 01/28/2015   LDLCALC 116* 01/28/2015   LDLDIRECT 111.0 08/08/2015   TRIG 100.0 01/28/2015   CHOLHDL 5 01/28/2015   A/P: tolerating current medicine as long as takes medicine in morning. at night has myalgias if he takes it. Continue atorvastatin 40mg  and update direct LDL with goal <100

## 2015-10-07 NOTE — Progress Notes (Signed)
Pre visit review using our clinic review tool, if applicable. No additional management support is needed unless otherwise documented below in the visit note. 

## 2016-01-07 ENCOUNTER — Other Ambulatory Visit: Payer: Self-pay | Admitting: Family Medicine

## 2016-01-23 ENCOUNTER — Encounter: Payer: Self-pay | Admitting: Gastroenterology

## 2016-01-31 ENCOUNTER — Other Ambulatory Visit (INDEPENDENT_AMBULATORY_CARE_PROVIDER_SITE_OTHER): Payer: Medicare Other

## 2016-01-31 DIAGNOSIS — Z0000 Encounter for general adult medical examination without abnormal findings: Secondary | ICD-10-CM | POA: Diagnosis not present

## 2016-01-31 LAB — POC URINALSYSI DIPSTICK (AUTOMATED)
Bilirubin, UA: NEGATIVE
Blood, UA: NEGATIVE
GLUCOSE UA: NEGATIVE
Ketones, UA: NEGATIVE
LEUKOCYTES UA: NEGATIVE
Nitrite, UA: NEGATIVE
PROTEIN UA: NEGATIVE
SPEC GRAV UA: 1.01
UROBILINOGEN UA: 0.2
pH, UA: 6

## 2016-01-31 LAB — CBC WITH DIFFERENTIAL/PLATELET
BASOS PCT: 0.6 % (ref 0.0–3.0)
Basophils Absolute: 0 10*3/uL (ref 0.0–0.1)
EOS ABS: 0.1 10*3/uL (ref 0.0–0.7)
Eosinophils Relative: 1.6 % (ref 0.0–5.0)
HCT: 48 % (ref 39.0–52.0)
Hemoglobin: 16.3 g/dL (ref 13.0–17.0)
Lymphocytes Relative: 17.6 % (ref 12.0–46.0)
Lymphs Abs: 1.2 10*3/uL (ref 0.7–4.0)
MCHC: 33.9 g/dL (ref 30.0–36.0)
MCV: 97 fl (ref 78.0–100.0)
MONO ABS: 0.5 10*3/uL (ref 0.1–1.0)
Monocytes Relative: 6.5 % (ref 3.0–12.0)
NEUTROS ABS: 5.1 10*3/uL (ref 1.4–7.7)
Neutrophils Relative %: 73.7 % (ref 43.0–77.0)
PLATELETS: 287 10*3/uL (ref 150.0–400.0)
RBC: 4.95 Mil/uL (ref 4.22–5.81)
RDW: 13.4 % (ref 11.5–15.5)
WBC: 7 10*3/uL (ref 4.0–10.5)

## 2016-01-31 LAB — BASIC METABOLIC PANEL
BUN: 13 mg/dL (ref 6–23)
CHLORIDE: 103 meq/L (ref 96–112)
CO2: 29 meq/L (ref 19–32)
CREATININE: 1.12 mg/dL (ref 0.40–1.50)
Calcium: 9.7 mg/dL (ref 8.4–10.5)
GFR: 69.45 mL/min (ref >60.00)
Glucose, Bld: 87 mg/dL (ref 70–99)
POTASSIUM: 4.9 meq/L (ref 3.5–5.1)
SODIUM: 139 meq/L (ref 135–145)

## 2016-01-31 LAB — TSH: TSH: 1.47 u[IU]/mL (ref 0.35–4.50)

## 2016-01-31 LAB — LIPID PANEL
CHOL/HDL RATIO: 3
CHOLESTEROL: 116 mg/dL (ref 0–200)
HDL: 39.1 mg/dL (ref >39.00)
LDL CALC: 60 mg/dL (ref 0–99)
NonHDL: 76.75
TRIGLYCERIDES: 82 mg/dL (ref 0.0–149.0)
VLDL: 16.4 mg/dL (ref 0.0–40.0)

## 2016-01-31 LAB — HEPATIC FUNCTION PANEL
ALT: 24 U/L (ref 0–53)
AST: 19 U/L (ref 0–37)
Albumin: 4.6 g/dL (ref 3.5–5.2)
Alkaline Phosphatase: 44 U/L (ref 39–117)
BILIRUBIN DIRECT: 0.2 mg/dL (ref 0.0–0.3)
BILIRUBIN TOTAL: 1 mg/dL (ref 0.2–1.2)
TOTAL PROTEIN: 7.8 g/dL (ref 6.0–8.3)

## 2016-01-31 LAB — PSA: PSA: 1.22 ng/mL (ref 0.10–4.00)

## 2016-02-02 ENCOUNTER — Other Ambulatory Visit: Payer: Medicare Other

## 2016-02-06 ENCOUNTER — Encounter: Payer: Self-pay | Admitting: Gastroenterology

## 2016-02-09 ENCOUNTER — Ambulatory Visit (INDEPENDENT_AMBULATORY_CARE_PROVIDER_SITE_OTHER): Payer: Medicare Other | Admitting: Family Medicine

## 2016-02-09 ENCOUNTER — Encounter: Payer: Self-pay | Admitting: Family Medicine

## 2016-02-09 VITALS — BP 124/80 | HR 69 | Temp 97.6°F | Ht 70.0 in | Wt 184.6 lb

## 2016-02-09 DIAGNOSIS — Z23 Encounter for immunization: Secondary | ICD-10-CM

## 2016-02-09 DIAGNOSIS — Z0000 Encounter for general adult medical examination without abnormal findings: Secondary | ICD-10-CM | POA: Diagnosis not present

## 2016-02-09 DIAGNOSIS — Z1211 Encounter for screening for malignant neoplasm of colon: Secondary | ICD-10-CM | POA: Diagnosis not present

## 2016-02-09 MED ORDER — DIAZEPAM 5 MG PO TABS: ORAL_TABLET | ORAL | 5 refills | Status: DC

## 2016-02-09 NOTE — Patient Instructions (Addendum)
Late January for colonoscopy- we will call you  Thanks for getting flu shot  No changes today  Refilled valium

## 2016-02-09 NOTE — Progress Notes (Signed)
Phone: [phone]  Subjective:  Patient presents today for their annual physical. Chief complaint-noted.   See problem oriented charting- ROS- full  review of systems was completed and negative including No chest pain or shortness of breath. No headache or blurry vision. No dizziness.   The following were reviewed and entered/updated in epic: Past Medical History:  Diagnosis Date  . ANXIETY 11/07/2006  . HYPERLIPIDEMIA 07/31/2007  . HYPERTENSION 11/07/2006  . OSTEOARTHRITIS 11/07/2006   Patient Active Problem List   Diagnosis Date Noted  . Hyperlipidemia 07/31/2007    Priority: Medium  . Insomnia 11/07/2006    Priority: Medium  . Essential hypertension 11/07/2006    Priority: Medium  . Actinic keratosis 08/08/2015    Priority: Low  . Plantar fasciitis of left foot 05/16/2011    Priority: Low  . Osteoarthritis of right knee 11/07/2006    Priority: Low   Past Surgical History:  Procedure Laterality Date  . CATARACT EXTRACTION     bilateral  . HERNIA REPAIR     2001 Dr Lennie Hummer left groin; right 2012    Family History  Problem Relation Age of Onset  . Heart disease Mother     pacer; MI late 51s or early 60s  . Hyperlipidemia Mother   . Hypertension Mother     Medications- reviewed and updated Current Outpatient Prescriptions  Medication Sig Dispense Refill  . aspirin EC 81 MG tablet Take 1 tablet (81 mg total) by mouth daily. 30 tablet   . atenolol (TENORMIN) 100 MG tablet TAKE 1/2 TABLET BY MOUTH DAILY 45 tablet 3  . atorvastatin (LIPITOR) 20 MG tablet Take 1 tablet (20 mg total) by mouth daily. 90 tablet 3  . diazepam (VALIUM) 5 MG tablet TAKE 1/2 TO 1 TABLET BY MOUTH DAILY AS NEEDED FOR SLEEP 30 tablet 5  . Omega-3 Fatty Acids (FISH OIL) 1000 MG CAPS Take 1 capsule by mouth daily.    . QUEtiapine (SEROQUEL) 25 MG tablet TAKE 1/2 TABLET BY MOUTH AT BEDTIME AS NEEDED FOR SLEEP 45 tablet 3   No current facility-administered medications for this visit.      Allergies-reviewed and updated No Known Allergies  Social History   Social History  . Marital status: Married    Spouse name: N/A  . Number of children: N/A  . Years of education: N/A   Social History Main Topics  . Smoking status: Never Smoker  . Smokeless tobacco: Never Used  . Alcohol use No  . Drug use: No  . Sexual activity: Yes   Other Topics Concern  . None   Social History Narrative   Married 1977. [place]. [place]. 2 grandkids from [place] 2005, [place] 2007.       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     Objective: BP 124/80 (BP Location: Left Arm, Patient Position: Sitting, Cuff Size: Large)   Pulse 69   Temp 97.6 F (36.4 C) (Oral)   Ht 5\' 10"  (1.778 m)   Wt 184 lb 9.6 oz (83.7 kg)   SpO2 94%   BMI 26.49 kg/m  Gen: NAD, resting comfortably, appears stated age HEENT: Mucous membranes are moist. Oropharynx normal Neck: no thyromegaly CV: RRR no murmurs rubs or gallops Lungs: CTAB no crackles, wheeze, rhonchi Abdomen: soft/nontender/nondistended/normal bowel sounds. Overweight.  Ext: no edema Skin: warm, dry Neuro: grossly normal, moves all extremities, PERRLA Rectal: normal tone, mild diffusely enlarged prostate, no  masses or tenderness  Assessment/Plan:  65 y.o. male presenting for annual physical.  Health Maintenance counseling: 1. Anticipatory guidance: Patient counseled regarding regular dental exams, eye exams, wearing seatbelts.  2. Risk factor reduction:  Advised patient of need for regular exercise and diet rich and fruits and vegetables to reduce risk of heart attack and stroke. Weight stable 3. Immunizations/screenings/ancillary studies Immunization History  Administered Date(s) Administered  . Influenza Split 02/02/2011, 01/15/2012  . Influenza Whole 12/20/2007, 01/27/2009, 03/01/2010  . Influenza, High Dose Seasonal PF 02/09/2016  . Influenza,inj,Quad PF,36+  Mos 12/26/2012, 12/31/2013, 01/03/2015  . Pneumococcal Conjugate-13 02/08/2015  . Pneumococcal Polysaccharide-23 12/10/2013  . Td 03/26/2000  . Tdap 10/10/2011  . Zoster 03/01/2010  4. Prostate cancer screening-  low risk based on psa and rectal , some BPH and nocturia Lab Results  Component Value Date   PSA 1.22 01/31/2016   PSA 1.31 01/28/2015   PSA 1.26 10/01/2012   5. Colon cancer screening - 2008 with repeat next year- can go ahead and refer to help set up late January - sent today 6. Skin cancer screening- had seen Dr. Allyson Sabal but high cost, short visit- unlikely to return  Status of chronic or acute concerns  Insomnia- controlled with seroquel 12.5mg  and valium 2.5 mg. Gets 6-7 hours.   HLD- controlled on atorvastatin with LDL 60  HTN- controlled on atenolol 100mg   1 year CPE as long as checks BP at home and <130/80  Orders Placed This Encounter  Procedures  . Flu vaccine HIGH DOSE PF  . Ambulatory referral to Gastroenterology    Referral Priority:   Routine    Referral Type:   Consultation    Referral Reason:   Specialty Services Required    Number of Visits Requested:   1    Meds ordered this encounter  Medications  . diazepam (VALIUM) 5 MG tablet    Sig: TAKE 1/2 TO 1 TABLET BY MOUTH DAILY AS NEEDED FOR SLEEP    Dispense:  30 tablet    Refill:  5    Return precautions advised.   Garret Reddish, MD

## 2016-02-09 NOTE — Progress Notes (Signed)
Pre visit review using our clinic review tool, if applicable. No additional management support is needed unless otherwise documented below in the visit note. 

## 2016-03-01 ENCOUNTER — Encounter: Payer: Self-pay | Admitting: Gastroenterology

## 2016-03-23 ENCOUNTER — Ambulatory Visit (AMBULATORY_SURGERY_CENTER): Payer: Self-pay | Admitting: *Deleted

## 2016-03-23 VITALS — Ht 70.0 in | Wt 186.6 lb

## 2016-03-23 DIAGNOSIS — Z1211 Encounter for screening for malignant neoplasm of colon: Secondary | ICD-10-CM

## 2016-03-23 MED ORDER — NA SULFATE-K SULFATE-MG SULF 17.5-3.13-1.6 GM/177ML PO SOLN: 1.0000 | Freq: Once | ORAL | 0 refills | Status: AC

## 2016-03-23 NOTE — Progress Notes (Signed)
No allergies to eggs or soy. No problems with anesthesia.  Pt given Emmi instructions for colonoscopy  No oxygen use  No diet drug use  

## 2016-03-27 ENCOUNTER — Encounter: Payer: Self-pay | Admitting: Gastroenterology

## 2016-04-09 ENCOUNTER — Encounter: Payer: Self-pay | Admitting: Gastroenterology

## 2016-04-09 ENCOUNTER — Ambulatory Visit (AMBULATORY_SURGERY_CENTER): Payer: Medicare Other | Admitting: Gastroenterology

## 2016-04-09 VITALS — BP 113/79 | HR 62 | Temp 98.4°F | Resp 13 | Ht 70.0 in | Wt 186.0 lb

## 2016-04-09 DIAGNOSIS — Z1212 Encounter for screening for malignant neoplasm of rectum: Secondary | ICD-10-CM

## 2016-04-09 DIAGNOSIS — Z1211 Encounter for screening for malignant neoplasm of colon: Secondary | ICD-10-CM | POA: Diagnosis not present

## 2016-04-09 DIAGNOSIS — D124 Benign neoplasm of descending colon: Secondary | ICD-10-CM | POA: Diagnosis not present

## 2016-04-09 HISTORY — PX: COLONOSCOPY: SHX174

## 2016-04-09 MED ORDER — SODIUM CHLORIDE 0.9 % IV SOLN: 500.0000 mL | INTRAVENOUS | Status: DC

## 2016-04-09 NOTE — Progress Notes (Signed)
Called to room to assist during endoscopic procedure.  Patient ID and intended procedure confirmed with present staff. Received instructions for my participation in the procedure from the performing physician.  

## 2016-04-09 NOTE — Progress Notes (Signed)
No home oxygen used

## 2016-04-09 NOTE — Op Note (Signed)
[place] Patient Name: Nathan Young Procedure Date: 04/09/2016 1:28 PM MRN: [id number] Endoscopist: Ladene Artist , MD Age: 66 Referring MD:  Date of Birth: 1950-07-07 Gender: Male Account #: [id number] Procedure:                Colonoscopy Indications:              Screening for colorectal malignant neoplasm Medicines:                Monitored Anesthesia Care Procedure:                Pre-Anesthesia Assessment:                           - Prior to the procedure, a History and Physical                            was performed, and patient medications and                            allergies were reviewed. The patient's tolerance of                            previous anesthesia was also reviewed. The risks                            and benefits of the procedure and the sedation                            options and risks were discussed with the patient.                            All questions were answered, and informed consent                            was obtained. Prior Anticoagulants: The patient has                            taken no previous anticoagulant or antiplatelet                            agents. ASA Grade Assessment: II - A patient with                            mild systemic disease. After reviewing the risks                            and benefits, the patient was deemed in                            satisfactory condition to undergo the procedure.                           After obtaining informed consent, the colonoscope  was passed under direct vision. Throughout the                            procedure, the patient's blood pressure, pulse, and                            oxygen saturations were monitored continuously. The                            Model PCF-H190L [id number]) scope was introduced                            through the anus and advanced to the the cecum,                            identified by  appendiceal orifice and ileocecal                            valve. The ileocecal valve, appendiceal orifice,                            and rectum were photographed. The quality of the                            bowel preparation was good. The colonoscopy was                            performed without difficulty. The patient tolerated                            the procedure well. Scope In: 1:40:45 PM Scope Out: 1:53:31 PM Scope Withdrawal Time: 0 hours 10 minutes 4 seconds  Total Procedure Duration: 0 hours 12 minutes 46 seconds  Findings:                 The perianal and digital rectal examinations were                            normal.                           A 6 mm polyp was found in the descending colon. The                            polyp was sessile. The polyp was removed with a                            cold snare. Resection and retrieval were complete.                           Internal hemorrhoids were found during                            retroflexion. The hemorrhoids were small and Grade  I (internal hemorrhoids that do not prolapse).                           A few medium-mouthed diverticula were found in the                            transverse colon. There was no evidence of                            diverticular bleeding.                           Many medium-mouthed diverticula were found in the                            sigmoid colon and descending colon.                           The exam was otherwise without abnormality on                            direct and retroflexion views. Complications:            No immediate complications. Estimated blood loss:                            None. Estimated Blood Loss:     Estimated blood loss: none. Impression:               - One 6 mm polyp in the descending colon, removed                            with a cold snare. Resected and retrieved.                           - Internal  hemorrhoids.                           - Mild diverticulosis in the transverse colon.                           - Moderate diverticulosis in the sigmoid colon and                            in the descending colon.                           - The examination was otherwise normal on direct                            and retroflexion views. Recommendation:           - Repeat colonoscopy in 5 years for surveillance if                            polyp is precancerous, otherwise screening  colonoscopy in 10 years.                           - Patient has a contact number available for                            emergencies. The signs and symptoms of potential                            delayed complications were discussed with the                            patient. Return to normal activities tomorrow.                            Written discharge instructions were provided to the                            patient.                           - High fiber diet.                           - Continue present medications.                           - Await pathology results. Ladene Artist, MD 04/09/2016 1:59:56 PM This report has been signed electronically.

## 2016-04-09 NOTE — Patient Instructions (Signed)
YOU HAD AN ENDOSCOPIC PROCEDURE TODAY AT [facility]:   Refer to the procedure report that was given to you for any specific questions about what was found during the examination.  If the procedure report does not answer your questions, please call your gastroenterologist to clarify.  If you requested that your care partner not be given the details of your procedure findings, then the procedure report has been included in a sealed envelope for you to review at your convenience later.  YOU SHOULD EXPECT: Some feelings of bloating in the abdomen. Passage of more gas than usual.  Walking can help get rid of the air that was put into your GI tract during the procedure and reduce the bloating. If you had a lower endoscopy (such as a colonoscopy or flexible sigmoidoscopy) you may notice spotting of blood in your stool or on the toilet paper. If you underwent a bowel prep for your procedure, you may not have a normal bowel movement for a few days.  Please Note:  You might notice some irritation and congestion in your nose or some drainage.  This is from the oxygen used during your procedure.  There is no need for concern and it should clear up in a day or so.  SYMPTOMS TO REPORT IMMEDIATELY:   Following lower endoscopy (colonoscopy or flexible sigmoidoscopy):  Excessive amounts of blood in the stool  Significant tenderness or worsening of abdominal pains  Swelling of the abdomen that is new, acute  Fever of 100F or higher  For urgent or emergent issues, a gastroenterologist can be reached at any hour by calling [phone].   DIET:  We do recommend a small meal at first, but then you may proceed to your regular diet.  Drink plenty of fluids but you should avoid alcoholic beverages for 24 hours.  ACTIVITY:  You should plan to take it easy for the rest of today and you should NOT DRIVE or use heavy machinery until tomorrow (because of the sedation medicines used during the test).     FOLLOW UP: Our staff will call the number listed on your records the next business day following your procedure to check on you and address any questions or concerns that you may have regarding the information given to you following your procedure. If we do not reach you, we will leave a message.  However, if you are feeling well and you are not experiencing any problems, there is no need to return our call.  We will assume that you have returned to your regular daily activities without incident.  If any biopsies were taken you will be contacted by phone or by letter within the next 1-3 weeks.  Please call us at [phone] if you have not heard about the biopsies in 3 weeks.   Await biopsy results to determined next repeat Colonoscopy High Fiber Diet (handout given) Polyps (handout given) Diverticulosis (handout given) Hemorrhoids (handout given)   SIGNATURES/CONFIDENTIALITY: You and/or your care partner have signed paperwork which will be entered into your electronic medical record.  These signatures attest to the fact that that the information above on your After Visit Summary has been reviewed and is understood.  Full responsibility of the confidentiality of this discharge information lies with you and/or your care-partner.

## 2016-04-09 NOTE — Progress Notes (Signed)
Report to PACU, RN, vss, BBS= Clear.  

## 2016-04-10 ENCOUNTER — Telehealth: Payer: Self-pay | Admitting: *Deleted

## 2016-04-10 NOTE — Telephone Encounter (Signed)
  Follow up Call-  Call back number 04/09/2016  Post procedure Call Back phone  # [phone]  Permission to leave phone message Yes  Some recent data might be hidden     Patient questions:  Do you have a fever, pain , or abdominal swelling? No. Pain Score  0 *  Have you tolerated food without any problems? Yes.    Have you been able to return to your normal activities? Yes.    Do you have any questions about your discharge instructions: Diet   No. Medications  No. Follow up visit  No.  Do you have questions or concerns about your Care? No.  Actions: * If pain score is 4 or above: No action needed, pain <4.

## 2016-04-12 ENCOUNTER — Encounter: Payer: Self-pay | Admitting: Gastroenterology

## 2016-04-13 ENCOUNTER — Encounter: Payer: Self-pay | Admitting: Family Medicine

## 2016-04-13 DIAGNOSIS — Z86010 Personal history of colonic polyps: Secondary | ICD-10-CM | POA: Insufficient documentation

## 2016-07-05 ENCOUNTER — Telehealth: Payer: Self-pay | Admitting: Family Medicine

## 2016-07-05 NOTE — Telephone Encounter (Signed)
Pt states he has heard a lot about  "statins" and all the bad reviews. Pt is concerned and wants to see if you will perhaps wean him off of his atorvastatin (LIPITOR) 20 MG tablet  Please advise

## 2016-07-05 NOTE — Telephone Encounter (Signed)
While there are side effects, medicine also lowers his risk of heart attack and stroke which are huge benefits.   He would not have to wean off if wants to come off- could just stop and we could recheck levels at next physical and discuss how much this increased his cardiac and stroke risk.

## 2016-07-06 NOTE — Telephone Encounter (Signed)
Called and left a voicemail message asking for a return phone call 

## 2016-07-09 NOTE — Telephone Encounter (Signed)
Pt here in the office. Instructed he could come off medication without weaning. He verbalized understanding

## 2016-07-29 ENCOUNTER — Other Ambulatory Visit: Payer: Self-pay | Admitting: Family Medicine

## 2016-09-13 ENCOUNTER — Other Ambulatory Visit: Payer: Self-pay | Admitting: Family Medicine

## 2016-11-15 ENCOUNTER — Encounter: Payer: Self-pay | Admitting: Family Medicine

## 2016-11-15 ENCOUNTER — Ambulatory Visit (INDEPENDENT_AMBULATORY_CARE_PROVIDER_SITE_OTHER): Payer: Medicare Other | Admitting: Family Medicine

## 2016-11-15 ENCOUNTER — Ambulatory Visit (INDEPENDENT_AMBULATORY_CARE_PROVIDER_SITE_OTHER)
Admission: RE | Admit: 2016-11-15 | Discharge: 2016-11-15 | Disposition: A | Discharge status: Home / Self Care | Payer: Medicare Other | Source: Ambulatory Visit | Attending: Family Medicine | Admitting: Family Medicine

## 2016-11-15 VITALS — BP 128/88 | HR 61 | Temp 97.6°F | Ht 70.0 in | Wt 186.8 lb

## 2016-11-15 DIAGNOSIS — M25552 Pain in left hip: Secondary | ICD-10-CM | POA: Diagnosis not present

## 2016-11-15 MED ORDER — DICLOFENAC SODIUM 75 MG PO TBEC: 75.0000 mg | DELAYED_RELEASE_TABLET | Freq: Two times a day (BID) | ORAL | 0 refills | Status: DC

## 2016-11-15 NOTE — Progress Notes (Signed)
Subjective:  Nathan Young is a 66 y.o. year old very pleasant male patient who presents for/with See problem oriented charting ROS- no fecal or urinary incontinence, no saddle anesthesia, no leg weakness.    Past Medical History-  Patient Active Problem List   Diagnosis Date Noted  . Hyperlipidemia 07/31/2007    Priority: Medium  . Insomnia 11/07/2006    Priority: Medium  . Essential hypertension 11/07/2006    Priority: Medium  . Actinic keratosis 08/08/2015    Priority: Low  . Plantar fasciitis of left foot 05/16/2011    Priority: Low  . Osteoarthritis of right knee 11/07/2006    Priority: Low  . History of adenomatous polyp of colon 04/13/2016    Medications- reviewed and updated Current Outpatient Prescriptions  Medication Sig Dispense Refill  . aspirin EC 81 MG tablet Take 1 tablet (81 mg total) by mouth daily. 30 tablet   . atenolol (TENORMIN) 100 MG tablet TAKE 1/2 TABLET BY MOUTH DAILY 45 tablet 3  . atorvastatin (LIPITOR) 20 MG tablet TAKE 1 TABLET (20 MG TOTAL) BY MOUTH DAILY. 90 tablet 3  . diazepam (VALIUM) 5 MG tablet TAKE 1/2 TO 1 TABLET BY MOUTH ONCE DAILY AS NEEDED FOR SLEEP 30 tablet 5  . Omega-3 Fatty Acids (FISH OIL) 1000 MG CAPS Take 1 capsule by mouth daily.    . QUEtiapine (SEROQUEL) 25 MG tablet TAKE 1/2 TABLET BY MOUTH AT BEDTIME AS NEEDED FOR SLEEP 45 tablet 3   No current facility-administered medications for this visit.     Objective: BP 128/88 (BP Location: Left Arm, Patient Position: Sitting, Cuff Size: Large)   Pulse 61   Temp 97.6 F (36.4 C) (Oral)   Ht 5\' 10"  (1.778 m)   Wt 186 lb 12.8 oz (84.7 kg)   SpO2 98%   BMI 26.80 kg/m  Gen: NAD, resting comfortably CV: RRR no murmurs rubs or gallops Lungs: CTAB no crackles, wheeze, rhonchi Abdomen: soft/nontender/nondistended/normal bowel sounds. No rebound or guarding.  No obvious hernia in groin Ext: no edema Skin: warm, dry  No pain to palpation of left low back or buttocks. Negative  straight leg raise.  Hip: ROM normal. Normal strength.  Greater trochanter without tenderness to palpation. No tenderness over piriformis and greater trochanter. Pain with flexion of hip/thigh. No pain with IR or ER of hip.   Assessment/Plan:  Left hip pain - Plan: DG HIP UNILAT WITH PELVIS 2-3 VIEWS LEFT S: patient dealing with left low back pain but primarily in buttocks for 3weeks. In the last week it has moved into his left side and left groin. Low levelConstant pain- better with getting up and working. Worse with bending over. Sitting does not change pain level. Pain 2/10 right now has been up to 5-6/10 at worst.  Has tried heating pad/ibuprofen 400mg  in Am - helps some.   No burning with peeing. No polyuria. Has had one sweating episode that was abnormal but otherwise no fever/chills. Regular BMs at least every other day (normal for him), some firm stools.  A/P: Unclear cause- pain into the groin and worse with hip flexion- could be OA. Do not see clear hernia on exam.  Patient Instructions  This could be arthritis of the hip or a strain in your muscles in that region. I do not feel an obvious hernia.   Let's get an x-ray today.   Trial voltaren/diclofenac for 1 week- if not at least 50% better lets plan on physical therapy potentially. Could get  you into orthopedics or sports medicine if none of the above improves your pain.   Orders Placed This Encounter  Procedures  . DG HIP UNILAT WITH PELVIS 2-3 VIEWS LEFT    Standing Status:   Future    Standing Expiration Date:   01/15/2018    Order Specific Question:   Reason for Exam (SYMPTOM  OR DIAGNOSIS REQUIRED)    Answer:   evaluate arthritis. Left hip/groin pain worse with flexion of thigh.    Order Specific Question:   Preferred imaging location?    Answer:   [facility]    Order Specific Question:   Radiology Contrast Protocol - do NOT remove file path    Answer:   [email or web address]     Meds ordered this encounter  Medications  . diclofenac (VOLTAREN) 75 MG EC tablet    Sig: Take 1 tablet (75 mg total) by mouth 2 (two) times daily.    Dispense:  20 tablet    Refill:  0    Return precautions advised.  Garret Reddish, MD

## 2016-11-15 NOTE — Patient Instructions (Addendum)
This could be arthritis of the hip or a strain in your muscles in that region. I do not feel an obvious hernia.   Let's get an x-ray today.   Trial voltaren/diclofenac for 1 week- if not at least 50% better lets plan on physical therapy potentially. Could get you into orthopedics or sports medicine if none of the above improves your pain.   Let us know if you have worsening pain despite the above  Do not take ibuprofen with voltaren  Please go to [facility] - located [address] across the street from [place] - in the basement - Hours: 8:30-5:30 PM M-F. Do not need appointment.

## 2016-12-25 ENCOUNTER — Ambulatory Visit (INDEPENDENT_AMBULATORY_CARE_PROVIDER_SITE_OTHER): Payer: Medicare Other

## 2016-12-25 DIAGNOSIS — Z23 Encounter for immunization: Secondary | ICD-10-CM

## 2017-01-08 ENCOUNTER — Other Ambulatory Visit: Payer: Self-pay

## 2017-01-08 MED ORDER — ATENOLOL 100 MG PO TABS: 50.0000 mg | ORAL_TABLET | Freq: Every day | ORAL | 3 refills | Status: DC

## 2017-01-08 MED ORDER — QUETIAPINE FUMARATE 25 MG PO TABS: ORAL_TABLET | ORAL | 3 refills | Status: DC

## 2017-01-23 ENCOUNTER — Encounter: Payer: Self-pay | Admitting: Family Medicine

## 2017-01-23 ENCOUNTER — Ambulatory Visit (INDEPENDENT_AMBULATORY_CARE_PROVIDER_SITE_OTHER): Payer: Medicare Other | Admitting: Family Medicine

## 2017-01-23 VITALS — BP 128/86 | HR 67 | Temp 97.4°F | Ht 70.0 in | Wt 186.6 lb

## 2017-01-23 DIAGNOSIS — G4700 Insomnia, unspecified: Secondary | ICD-10-CM | POA: Diagnosis not present

## 2017-01-23 DIAGNOSIS — Z125 Encounter for screening for malignant neoplasm of prostate: Secondary | ICD-10-CM | POA: Diagnosis not present

## 2017-01-23 DIAGNOSIS — I10 Essential (primary) hypertension: Secondary | ICD-10-CM

## 2017-01-23 DIAGNOSIS — M1711 Unilateral primary osteoarthritis, right knee: Secondary | ICD-10-CM | POA: Diagnosis not present

## 2017-01-23 DIAGNOSIS — Z0000 Encounter for general adult medical examination without abnormal findings: Secondary | ICD-10-CM

## 2017-01-23 DIAGNOSIS — E785 Hyperlipidemia, unspecified: Secondary | ICD-10-CM | POA: Diagnosis not present

## 2017-01-23 LAB — LIPID PANEL
CHOLESTEROL: 113 mg/dL (ref 0–200)
HDL: 34.7 mg/dL — AB (ref >39.00)
LDL CALC: 63 mg/dL (ref 0–99)
NonHDL: 77.89
Total CHOL/HDL Ratio: 3
Triglycerides: 75 mg/dL (ref 0.0–149.0)
VLDL: 15 mg/dL (ref 0.0–40.0)

## 2017-01-23 LAB — COMPREHENSIVE METABOLIC PANEL
ALBUMIN: 4.5 g/dL (ref 3.5–5.2)
ALT: 21 U/L (ref 0–53)
AST: 19 U/L (ref 0–37)
Alkaline Phosphatase: 43 U/L (ref 39–117)
BUN: 18 mg/dL (ref 6–23)
CHLORIDE: 104 meq/L (ref 96–112)
CO2: 29 mEq/L (ref 19–32)
Calcium: 9.7 mg/dL (ref 8.4–10.5)
Creatinine, Ser: 1.08 mg/dL (ref 0.40–1.50)
GFR: 72.22 mL/min (ref >60.00)
GLUCOSE: 88 mg/dL (ref 70–99)
POTASSIUM: 4.6 meq/L (ref 3.5–5.1)
SODIUM: 138 meq/L (ref 135–145)
Total Bilirubin: 1 mg/dL (ref 0.2–1.2)
Total Protein: 7.8 g/dL (ref 6.0–8.3)

## 2017-01-23 LAB — CBC
HEMATOCRIT: 46.5 % (ref 39.0–52.0)
HEMOGLOBIN: 15.8 g/dL (ref 13.0–17.0)
MCHC: 33.9 g/dL (ref 30.0–36.0)
MCV: 98.3 fl (ref 78.0–100.0)
Platelets: 268 10*3/uL (ref 150.0–400.0)
RBC: 4.73 Mil/uL (ref 4.22–5.81)
RDW: 13.1 % (ref 11.5–15.5)
WBC: 6.5 10*3/uL (ref 4.0–10.5)

## 2017-01-23 LAB — PSA: PSA: 1.59 ng/mL (ref 0.10–4.00)

## 2017-01-23 MED ORDER — DICLOFENAC SODIUM 75 MG PO TBEC: 75.0000 mg | DELAYED_RELEASE_TABLET | Freq: Two times a day (BID) | ORAL | 1 refills | Status: DC

## 2017-01-23 NOTE — Assessment & Plan Note (Signed)
voltaren helped knee as well as left hip. Left hip better- right knee bothers intermittently- refilled voltaren and discussed sparing use

## 2017-01-23 NOTE — Assessment & Plan Note (Signed)
HTN- controlled on atenolol 100mg 

## 2017-01-23 NOTE — Assessment & Plan Note (Signed)
HLD- controlled on atorvastatin 40mg  on last check. Update lipids today. Ideal goal <70 LDL but under 100 tolerable

## 2017-01-23 NOTE — Patient Instructions (Addendum)
Please stop by lab before you go  Try to stick with 2.5 mg valium if you can  Can use sparing diclofenac/voltaren for the knee- will need to keep an eye on your blood pressure on this and kidneys each year (checking today)  Would be worth checking in 6 months from now for blood pressure and with increasing need for valium  Love your idea of losing 5 lbs

## 2017-01-23 NOTE — Assessment & Plan Note (Signed)
Insomnia- controlled on seroquel 12.5mg  and valium 5mg  (up from 2.5 mg- discussed risks of increasing dose in long run). Gets 6-7 hours of sleep with this.

## 2017-01-23 NOTE — Progress Notes (Signed)
Phone: [phone]  Subjective:  Patient presents today for their annual physical. Chief complaint-noted.   See problem oriented charting- ROS- full  review of systems was completed and negative except for: right knee pain  The following were reviewed and entered/updated in epic: Past Medical History:  Diagnosis Date  . ANXIETY 11/07/2006  . HYPERLIPIDEMIA 07/31/2007  . HYPERTENSION 11/07/2006  . OSTEOARTHRITIS 11/07/2006  . Sleep apnea    hasn't used cpap for "10-12 years"   Patient Active Problem List   Diagnosis Date Noted  . Hyperlipidemia 07/31/2007    Priority: Medium  . Insomnia 11/07/2006    Priority: Medium  . Essential hypertension 11/07/2006    Priority: Medium  . History of adenomatous polyp of colon 04/13/2016    Priority: Low  . Actinic keratosis 08/08/2015    Priority: Low  . Plantar fasciitis of left foot 05/16/2011    Priority: Low  . Osteoarthritis of right knee 11/07/2006    Priority: Low   Past Surgical History:  Procedure Laterality Date  . CATARACT EXTRACTION     bilateral  . HERNIA REPAIR     2001 Dr Lennie Hummer left groin; right 2012  . KNEE ARTHROSCOPY Right 2008    Family History  Problem Relation Age of Onset  . Heart disease Mother        pacer; MI late 37s or early 65s  . Hyperlipidemia Mother   . Hypertension Mother   . Colon cancer Neg Hx   . Esophageal cancer Neg Hx   . Stomach cancer Neg Hx   . Rectal cancer Neg Hx     Medications- reviewed and updated Current Outpatient Prescriptions  Medication Sig Dispense Refill  . aspirin EC 81 MG tablet Take 1 tablet (81 mg total) by mouth daily. 30 tablet   . atenolol (TENORMIN) 100 MG tablet Take 0.5 tablets (50 mg total) by mouth daily. 45 tablet 3  . atorvastatin (LIPITOR) 20 MG tablet TAKE 1 TABLET (20 MG TOTAL) BY MOUTH DAILY. 90 tablet 3  . diazepam (VALIUM) 5 MG tablet TAKE 1/2 TO 1 TABLET BY MOUTH ONCE DAILY AS NEEDED FOR SLEEP 30 tablet 5  . diclofenac (VOLTAREN) 75 MG EC  tablet Take 1 tablet (75 mg total) by mouth 2 (two) times daily. 30 tablet 1  . Omega-3 Fatty Acids (FISH OIL) 1000 MG CAPS Take 1 capsule by mouth daily.    . QUEtiapine (SEROQUEL) 25 MG tablet TAKE 1/2 TABLET BY MOUTH AT BEDTIME AS NEEDED FOR SLEEP 45 tablet 3   No current facility-administered medications for this visit.     Allergies-reviewed and updated No Known Allergies  Social History   Social History  . Marital status: Married    Spouse name: N/A  . Number of children: N/A  . Years of education: N/A   Social History Main Topics  . Smoking status: Never Smoker  . Smokeless tobacco: Never Used  . Alcohol use No  . Drug use: No  . Sexual activity: Yes   Other Topics Concern  . None   Social History Narrative   Married 1977. [place]. [place]. 2 grandkids from [place] 2005, [place] 2007.       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     Objective: BP 128/86 (BP Location: Left Arm, Patient Position: Sitting, Cuff Size: Large)   Pulse 67   Temp (!) 97.4 F (36.3 C) (Oral)  Ht 5\' 10"  (1.778 m)   Wt 186 lb 9.6 oz (84.6 kg)   SpO2 95%   BMI 26.77 kg/m  Gen: NAD, resting comfortably HEENT: Mucous membranes are moist. Oropharynx normal Neck: no thyromegaly CV: RRR no murmurs rubs or gallops Lungs: CTAB no crackles, wheeze, rhonchi Abdomen: soft/nontender/nondistended/normal bowel sounds. No rebound or guarding.  Ext: no edema Skin: warm, dry Neuro: grossly normal, moves all extremities, PERRLA Rectal: normal tone, diffusely enlarged prostate, no masses or tenderness  Assessment/Plan:  66 y.o. male presenting for annual physical.  Health Maintenance counseling: 1. Anticipatory guidance: Patient counseled regarding regular dental exams q6 months, eye exams -yearly, wearing seatbelts.  2. Risk factor reduction:  Advised patient of need for regular exercise and diet rich and fruits and vegetables  to reduce risk of heart attack and stroke. Exercise- no intentional regular exercise- advised 150 mins a week- he does enjoy walk. Diet-largely balanced diet, weight stable- he wants to lose about 5 lbs.  Wt Readings from Last 3 Encounters:  01/23/17 186 lb 9.6 oz (84.6 kg)  11/15/16 186 lb 12.8 oz (84.7 kg)  04/09/16 186 lb (84.4 kg)  3. Immunizations/screenings/ancillary studies- discussed shingrix at pharmacy - his name is in to get it Immunization History  Administered Date(s) Administered  . Influenza Split 02/02/2011, 01/15/2012  . Influenza Whole 12/20/2007, 01/27/2009, 03/01/2010  . Influenza, High Dose Seasonal PF 02/09/2016, 12/25/2016  . Influenza,inj,Quad PF,6+ Mos 12/26/2012, 12/31/2013, 01/03/2015  . Pneumococcal Conjugate-13 02/08/2015  . Pneumococcal Polysaccharide-23 12/10/2013  . Td 03/26/2000  . Tdap 10/10/2011  . Zoster 03/01/2010  4. Prostate cancer screening-  he would like to screen through age 1 at least. Update psa today- prior low risk trend. Some bph on exam in past. No nocturia Lab Results  Component Value Date   PSA 1.22 01/31/2016   PSA 1.31 01/28/2015   PSA 1.26 10/01/2012   5. Colon cancer screening - 04/09/16 with 5 year repeat due to adenomatous polyp 6. Skin cancer screening- seen Dr. Allyson Sabal in past- does not planto return. Now going to Dr. Nevada Crane. advised regular sunscreen use. Denies worrisome, changing, or new skin lesions.   Status of chronic or acute concerns   Left hip pain- seen 2 months ago- we thought likely arthritis based on history but x-ray did not show significant arthritis. Did diclofenac for a week. Discussed sports medicine if not improved. Took a few days and resolved- has a few diclofenac on hand. Has some right knee pain and it helps with that as well  Hyperlipidemia HLD- controlled on atorvastatin 40mg  on last check. Update lipids today. Ideal goal <70 LDL but under 100 tolerable  Insomnia Insomnia- controlled on seroquel 12.5mg   and valium 5mg  (up from 2.5 mg- discussed risks of increasing dose in long run). Gets 6-7 hours of sleep with this.   Essential hypertension HTN- controlled on atenolol 100mg   Osteoarthritis of right knee voltaren helped knee as well as left hip. Left hip better- right knee bothers intermittently- refilled voltaren and discussed sparing use  1 year follow up as long as home BPs controlled  Orders Placed This Encounter  Procedures  . CBC    [place]  . Comprehensive metabolic panel    [place]    Order Specific Question:   Has the patient fasted?    Answer:   No  . Lipid panel    [place]    Order Specific Question:   Has the patient fasted?    Answer:   No  .  PSA   Meds ordered this encounter  Medications  . diclofenac (VOLTAREN) 75 MG EC tablet    Sig: Take 1 tablet (75 mg total) by mouth 2 (two) times daily.    Dispense:  30 tablet    Refill:  1   Return precautions advised.  Garret Reddish, MD

## 2017-04-15 ENCOUNTER — Other Ambulatory Visit: Payer: Self-pay

## 2017-04-15 MED ORDER — DIAZEPAM 5 MG PO TABS: ORAL_TABLET | ORAL | 5 refills | Status: DC

## 2017-04-16 ENCOUNTER — Other Ambulatory Visit: Payer: Self-pay

## 2017-04-16 MED ORDER — ATENOLOL 50 MG PO TABS: 50.0000 mg | ORAL_TABLET | Freq: Every day | ORAL | 1 refills | Status: DC

## 2017-07-22 ENCOUNTER — Ambulatory Visit (INDEPENDENT_AMBULATORY_CARE_PROVIDER_SITE_OTHER): Payer: Medicare Other | Admitting: Family Medicine

## 2017-07-22 ENCOUNTER — Encounter: Payer: Self-pay | Admitting: Family Medicine

## 2017-07-22 ENCOUNTER — Ambulatory Visit (INDEPENDENT_AMBULATORY_CARE_PROVIDER_SITE_OTHER): Payer: Medicare Other

## 2017-07-22 VITALS — BP 128/88 | HR 68 | Temp 97.5°F | Ht 70.0 in | Wt 187.2 lb

## 2017-07-22 DIAGNOSIS — M25561 Pain in right knee: Secondary | ICD-10-CM

## 2017-07-22 DIAGNOSIS — E785 Hyperlipidemia, unspecified: Secondary | ICD-10-CM

## 2017-07-22 DIAGNOSIS — G8929 Other chronic pain: Secondary | ICD-10-CM

## 2017-07-22 DIAGNOSIS — I10 Essential (primary) hypertension: Secondary | ICD-10-CM | POA: Diagnosis not present

## 2017-07-22 DIAGNOSIS — G4700 Insomnia, unspecified: Secondary | ICD-10-CM | POA: Diagnosis not present

## 2017-07-22 DIAGNOSIS — M1711 Unilateral primary osteoarthritis, right knee: Secondary | ICD-10-CM | POA: Diagnosis not present

## 2017-07-22 LAB — BASIC METABOLIC PANEL
BUN: 17 mg/dL (ref 6–23)
CO2: 28 mEq/L (ref 19–32)
CREATININE: 1.12 mg/dL (ref 0.40–1.50)
Calcium: 9.7 mg/dL (ref 8.4–10.5)
Chloride: 102 mEq/L (ref 96–112)
GFR: 69.15 mL/min (ref >60.00)
Glucose, Bld: 86 mg/dL (ref 70–99)
Potassium: 4.7 mEq/L (ref 3.5–5.1)
Sodium: 138 mEq/L (ref 135–145)

## 2017-07-22 LAB — LDL CHOLESTEROL, DIRECT: LDL DIRECT: 72 mg/dL

## 2017-07-22 MED ORDER — DICLOFENAC SODIUM 75 MG PO TBEC: 75.0000 mg | DELAYED_RELEASE_TABLET | Freq: Two times a day (BID) | ORAL | 2 refills | Status: DC | PRN

## 2017-07-22 NOTE — Assessment & Plan Note (Addendum)
S:  controlled on atorvastatin 20mg - has to take his in the AM and no issues with myalgias. Last LDL 63 which is excellent. Also on fish oil.  Lab Results  Component Value Date   CHOL 113 01/23/2017   HDL 34.70 (L) 01/23/2017   LDLCALC 63 01/23/2017   LDLDIRECT 65.0 10/07/2015   TRIG 75.0 01/23/2017   CHOLHDL 3 01/23/2017   A/P: continue current meds other than we did discuss he can stop aspirin for primary prevention . Can also stop fish oil then test full lipids next visit to make sure still controlled.

## 2017-07-22 NOTE — Progress Notes (Signed)
Your cholesterol is well controlled Your BMET was normal (kidney, electrolytes, blood sugar).

## 2017-07-22 NOTE — Assessment & Plan Note (Signed)
S: Continues to have issues with arthritis.  Voltaren has helped his knee as well as left hip in past but only used for a few days. No recent hip pain recurrence in left hip.   Has a really hard time getting up from the ground due to right knee pain. Left knee doesn't bother him much. Using equate arthritis 8 hour (not sure if tylenol or nsaid).  Pain in right knee up to 7/10- with equate pain relief 4/10.   History arthroscopic surgery about 12 years ago with Dr. Elmyra Ricks.  A/P: for 5 days will trial diclofenac twice a day then go to as needed one to two times a day. May want to have some zantac on hand in case he has reflux. Will get updated x-ray. If this is not effective or side effects are too much- he will return to the office for injection.

## 2017-07-22 NOTE — Progress Notes (Signed)
Subjective:  Nathan Young is a 67 y.o. year old very pleasant male patient who presents for/with See problem oriented charting ROS- No chest pain or shortness of breath. No headache or blurry vision.  Does have right knee pain.  Has not had any left hip pain.  Past Medical History-  Patient Active Problem List   Diagnosis Date Noted  . Hyperlipidemia 07/31/2007    Priority: Medium  . Insomnia 11/07/2006    Priority: Medium  . Essential hypertension 11/07/2006    Priority: Medium  . History of adenomatous polyp of colon 04/13/2016    Priority: Low  . Actinic keratosis 08/08/2015    Priority: Low  . Plantar fasciitis of left foot 05/16/2011    Priority: Low  . Osteoarthritis of right knee 11/07/2006    Priority: Low    Medications- reviewed and updated Current Outpatient Medications  Medication Sig Dispense Refill  . atenolol (TENORMIN) 50 MG tablet Take 1 tablet (50 mg total) by mouth daily. 90 tablet 1  . atorvastatin (LIPITOR) 20 MG tablet TAKE 1 TABLET (20 MG TOTAL) BY MOUTH DAILY. 90 tablet 3  . diazepam (VALIUM) 5 MG tablet TAKE 1/2 TO 1 TABLET BY MOUTH ONCE DAILY AS NEEDED FOR SLEEP 30 tablet 5  . diclofenac (VOLTAREN) 75 MG EC tablet Take 1 tablet (75 mg total) by mouth 2 (two) times daily as needed. 60 tablet 2  . QUEtiapine (SEROQUEL) 25 MG tablet TAKE 1/2 TABLET BY MOUTH AT BEDTIME AS NEEDED FOR SLEEP 45 tablet 3   No current facility-administered medications for this visit.     Objective: BP 128/88 (BP Location: Left Arm, Patient Position: Sitting, Cuff Size: Normal)   Pulse 68   Temp (!) 97.5 F (36.4 C) (Oral)   Ht 5\' 10"  (1.778 m)   Wt 187 lb 3.2 oz (84.9 kg)   SpO2 95%   BMI 26.86 kg/m  Gen: NAD, resting comfortably CV: RRR no murmurs rubs or gallops Lungs: CTAB no crackles, wheeze, rhonchi Abdomen: soft/nontender/nondistended/normal bowel sounds. obese  Ext: no edema Skin: warm, dry Neuro: normal speech  Assessment/Plan:  Osteoarthritis of  right knee S: Continues to have issues with arthritis.  Voltaren has helped his knee as well as left hip in past but only used for a few days. No recent hip pain recurrence in left hip.   Has a really hard time getting up from the ground due to right knee pain. Left knee doesn't bother him much. Using equate arthritis 8 hour (not sure if tylenol or nsaid).  Pain in right knee up to 7/10- with equate pain relief 4/10.   History arthroscopic surgery about 12 years ago with Dr. Elmyra Ricks.  A/P: for 5 days will trial diclofenac twice a day then go to as needed one to two times a day. May want to have some zantac on hand in case he has reflux. Will get updated x-ray. If this is not effective or side effects are too much- he will return to the office for injection.    Essential hypertension S: controlled on atenolol 50 mg. BP Readings from Last 3 Encounters:  01/23/17 128/86  11/15/16 128/88  04/09/16 113/79  A/P: We discussed blood pressure goal of <140/90. Continue current meds   Insomnia S: Insomnia is reasonably controlled on Seroquel 12.5 mg and Valium 5 mg.  Able to get 6 to 7 hours of sleep with this combination. A/P: continue current medicines. He is aware of potential increased mortality on seroquel  Hyperlipidemia S:  controlled on atorvastatin 20mg - has to take his in the AM and no issues with myalgias. Last LDL 63 which is excellent. Also on fish oil.  Lab Results  Component Value Date   CHOL 113 01/23/2017   HDL 34.70 (L) 01/23/2017   LDLCALC 63 01/23/2017   LDLDIRECT 65.0 10/07/2015   TRIG 75.0 01/23/2017   CHOLHDL 3 01/23/2017   A/P: continue current meds other than we did discuss he can stop aspirin for primary prevention . Can also stop fish oil then test full lipids next visit to make sure still controlled.   bmet to follow kidney function on nsaids. He requests ldl- full lipids next visit planned.  Return in about 6 months (around 01/21/2018) for physical. come  fasting.  Lab/Order associations: Essential hypertension - Plan: Basic metabolic panel  Hyperlipidemia, unspecified hyperlipidemia type - Plan: LDL cholesterol, direct  Chronic pain of right knee - Plan: DG Knee Bilateral Standing AP  Meds ordered this encounter  Medications  . diclofenac (VOLTAREN) 75 MG EC tablet    Sig: Take 1 tablet (75 mg total) by mouth 2 (two) times daily as needed.    Dispense:  60 tablet    Refill:  2   Return precautions advised.  Garret Reddish, MD

## 2017-07-22 NOTE — Assessment & Plan Note (Addendum)
S: controlled on atenolol 50 mg. BP Readings from Last 3 Encounters:  01/23/17 128/86  11/15/16 128/88  04/09/16 113/79  A/P: We discussed blood pressure goal of <140/90. Continue current meds

## 2017-07-22 NOTE — Patient Instructions (Addendum)
for 5 days will trial diclofenac twice a day then go to as needed one to two times a day. May want to have some zantac on hand in case he has reflux. Will get updated x-ray. If this is not effective or side effects are too much- he will return to the office for injection.   Please stop by lab and x-ray before you go

## 2017-07-22 NOTE — Assessment & Plan Note (Signed)
S: Insomnia is reasonably controlled on Seroquel 12.5 mg and Valium 5 mg.  Able to get 6 to 7 hours of sleep with this combination. A/P: continue current medicines. He is aware of potential increased mortality on seroquel

## 2017-07-23 ENCOUNTER — Other Ambulatory Visit: Payer: Self-pay

## 2017-07-23 MED ORDER — ATORVASTATIN CALCIUM 20 MG PO TABS: 20.0000 mg | ORAL_TABLET | Freq: Every day | ORAL | 3 refills | Status: DC

## 2017-07-30 ENCOUNTER — Telehealth: Payer: Self-pay | Admitting: Family Medicine

## 2017-07-30 NOTE — Telephone Encounter (Signed)
Patient calling with some questians about taking his  voltaren  He was advised to take as directed and prescribed  By Dr Yong Channel. He has taken the 5 day trial of two a day pills.He states he is taking one now  and it is helping.He was  Advised to have prilosec on hand in case GERD. He states His knee pain is better

## 2017-07-30 NOTE — Telephone Encounter (Signed)
Copied from [place]. Topic: Quick Communication - See Telephone Encounter >> Jul 30, 2017  8:48 AM Hewitt Shorts wrote: Pt has questions about taking his voltaren   Best number [phone]

## 2017-10-13 ENCOUNTER — Other Ambulatory Visit: Payer: Self-pay | Admitting: Family Medicine

## 2017-11-01 ENCOUNTER — Other Ambulatory Visit: Payer: Self-pay | Admitting: Family Medicine

## 2017-11-04 ENCOUNTER — Other Ambulatory Visit: Payer: Self-pay | Admitting: Family Medicine

## 2017-11-05 NOTE — Telephone Encounter (Signed)
Pt is scheduled to see you Nov. Last Rx was 03/2017, #30 with 5 refills.

## 2018-01-10 ENCOUNTER — Telehealth: Payer: Self-pay | Admitting: Family Medicine

## 2018-01-10 NOTE — Telephone Encounter (Signed)
MEDICATION: QUEtiapine (SEROQUEL) 25 MG tablet  PHARMACY:   [id number] [place], [place] - [address] [phone] ([phone] (Fax)     IS THIS A 90 DAY SUPPLY : yes  IS PATIENT OUT OF MEDICATION: unsure  IF NOT; HOW MUCH IS LEFT:   LAST APPOINTMENT DATE: @04 /29/19 NEXT APPOINTMENT DATE:@11 /03/2017  OTHER COMMENTS:    **Let patient know to contact pharmacy at the end of the day to make sure medication is ready. **  ** Please notify patient to allow 48-72 hours to process**  **Encourage patient to contact the pharmacy for refills or they can request refills through [facility]**

## 2018-01-13 ENCOUNTER — Other Ambulatory Visit: Payer: Self-pay

## 2018-01-13 MED ORDER — QUETIAPINE FUMARATE 25 MG PO TABS: ORAL_TABLET | ORAL | 3 refills | Status: DC

## 2018-01-13 NOTE — Telephone Encounter (Signed)
Prescription sent to pharmacy as requested.

## 2018-01-24 ENCOUNTER — Ambulatory Visit (INDEPENDENT_AMBULATORY_CARE_PROVIDER_SITE_OTHER): Payer: Medicare Other | Admitting: Family Medicine

## 2018-01-24 ENCOUNTER — Encounter: Payer: Self-pay | Admitting: Family Medicine

## 2018-01-24 VITALS — BP 122/84 | HR 62 | Temp 97.5°F | Ht 70.0 in | Wt 185.2 lb

## 2018-01-24 DIAGNOSIS — Z125 Encounter for screening for malignant neoplasm of prostate: Secondary | ICD-10-CM | POA: Diagnosis not present

## 2018-01-24 DIAGNOSIS — Z0000 Encounter for general adult medical examination without abnormal findings: Secondary | ICD-10-CM | POA: Diagnosis not present

## 2018-01-24 DIAGNOSIS — E785 Hyperlipidemia, unspecified: Secondary | ICD-10-CM | POA: Diagnosis not present

## 2018-01-24 DIAGNOSIS — M1711 Unilateral primary osteoarthritis, right knee: Secondary | ICD-10-CM

## 2018-01-24 LAB — COMPREHENSIVE METABOLIC PANEL
ALBUMIN: 4.4 g/dL (ref 3.5–5.2)
ALK PHOS: 44 U/L (ref 39–117)
ALT: 22 U/L (ref 0–53)
AST: 18 U/L (ref 0–37)
BUN: 13 mg/dL (ref 6–23)
CALCIUM: 9.6 mg/dL (ref 8.4–10.5)
CO2: 28 mEq/L (ref 19–32)
Chloride: 105 mEq/L (ref 96–112)
Creatinine, Ser: 1.19 mg/dL (ref 0.40–1.50)
GFR: 64.38 mL/min (ref >60.00)
Glucose, Bld: 83 mg/dL (ref 70–99)
POTASSIUM: 4.7 meq/L (ref 3.5–5.1)
Sodium: 141 mEq/L (ref 135–145)
TOTAL PROTEIN: 7 g/dL (ref 6.0–8.3)
Total Bilirubin: 0.8 mg/dL (ref 0.2–1.2)

## 2018-01-24 LAB — CBC
HEMATOCRIT: 41.8 % (ref 39.0–52.0)
HEMOGLOBIN: 14.5 g/dL (ref 13.0–17.0)
MCHC: 34.6 g/dL (ref 30.0–36.0)
MCV: 97.4 fl (ref 78.0–100.0)
Platelets: 266 10*3/uL (ref 150.0–400.0)
RBC: 4.29 Mil/uL (ref 4.22–5.81)
RDW: 13 % (ref 11.5–15.5)
WBC: 6 10*3/uL (ref 4.0–10.5)

## 2018-01-24 LAB — LIPID PANEL
Cholesterol: 120 mg/dL (ref 0–200)
HDL: 31.6 mg/dL — AB (ref >39.00)
LDL Cholesterol: 66 mg/dL (ref 0–99)
NonHDL: 88.51
Total CHOL/HDL Ratio: 4
Triglycerides: 111 mg/dL (ref 0.0–149.0)
VLDL: 22.2 mg/dL (ref 0.0–40.0)

## 2018-01-24 LAB — PSA: PSA: 1.46 ng/mL (ref 0.10–4.00)

## 2018-01-24 MED ORDER — DICLOFENAC SODIUM 75 MG PO TBEC: 75.0000 mg | DELAYED_RELEASE_TABLET | Freq: Two times a day (BID) | ORAL | 2 refills | Status: DC | PRN

## 2018-01-24 MED ORDER — ATENOLOL 50 MG PO TABS: 50.0000 mg | ORAL_TABLET | Freq: Every day | ORAL | 3 refills | Status: DC

## 2018-01-24 MED ORDER — ATORVASTATIN CALCIUM 20 MG PO TABS: 20.0000 mg | ORAL_TABLET | Freq: Every day | ORAL | 3 refills | Status: DC

## 2018-01-24 MED ORDER — QUETIAPINE FUMARATE 25 MG PO TABS: ORAL_TABLET | ORAL | 3 refills | Status: DC

## 2018-01-24 MED ORDER — DIAZEPAM 5 MG PO TABS: ORAL_TABLET | ORAL | 5 refills | Status: DC

## 2018-01-24 NOTE — Progress Notes (Signed)
Phone: [phone]  Subjective:  Patient presents today for their annual physical. Chief complaint-noted.   See problem oriented charting- ROS- full  review of systems was completed and negative except for: right knee pain, som ehip pain  The following were reviewed and entered/updated in epic: Past Medical History:  Diagnosis Date  . ANXIETY 11/07/2006  . HYPERLIPIDEMIA 07/31/2007  . HYPERTENSION 11/07/2006  . OSTEOARTHRITIS 11/07/2006  . Sleep apnea    hasn't used cpap for "10-12 years"   Patient Active Problem List   Diagnosis Date Noted  . Hyperlipidemia 07/31/2007    Priority: Medium  . Insomnia 11/07/2006    Priority: Medium  . Essential hypertension 11/07/2006    Priority: Medium  . History of adenomatous polyp of colon 04/13/2016    Priority: Low  . Actinic keratosis 08/08/2015    Priority: Low  . Plantar fasciitis of left foot 05/16/2011    Priority: Low  . Osteoarthritis of right knee 11/07/2006    Priority: Low   Past Surgical History:  Procedure Laterality Date  . CATARACT EXTRACTION     bilateral  . HERNIA REPAIR     2001 Dr Lennie Hummer left groin; right 2012  . KNEE ARTHROSCOPY Right 2008    Family History  Problem Relation Age of Onset  . Heart disease Mother        pacer; MI late 51s or early 43s  . Hyperlipidemia Mother   . Hypertension Mother   . Colon cancer Neg Hx   . Esophageal cancer Neg Hx   . Stomach cancer Neg Hx   . Rectal cancer Neg Hx     Medications- reviewed and updated Current Outpatient Medications  Medication Sig Dispense Refill  . atenolol (TENORMIN) 50 MG tablet TAKE 1 TABLET BY MOUTH EVERY DAY 90 tablet 1  . atorvastatin (LIPITOR) 20 MG tablet Take 1 tablet (20 mg total) by mouth daily. 90 tablet 3  . diazepam (VALIUM) 5 MG tablet TAKE 1/2 TO 1 TABLET BY MOUTH EVERY DAY AS NEEDED FOR SLEEP 30 tablet 5  . diclofenac (VOLTAREN) 75 MG EC tablet TAKE 1 TABLET (75 MG TOTAL) BY MOUTH 2 (TWO) TIMES DAILY AS NEEDED. 60 tablet 2  .  QUEtiapine (SEROQUEL) 25 MG tablet TAKE 1/2 TABLET BY MOUTH AT BEDTIME AS NEEDED FOR SLEEP 45 tablet 3   No current facility-administered medications for this visit.     Allergies-reviewed and updated No Known Allergies  Social History   Social History Narrative   Married 1977. [place]. [place]. 2 grandkids from [place] 2005, [place] 2007.       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     Objective: BP 122/84 (BP Location: Left Arm, Patient Position: Sitting, Cuff Size: Large)   Pulse 62   Temp (!) 97.5 F (36.4 C) (Oral)   Ht 5\' 10"  (1.778 m)   Wt 185 lb 3.2 oz (84 kg)   SpO2 96%   BMI 26.57 kg/m  Gen: NAD, resting comfortably HEENT: Mucous membranes are moist. Oropharynx normal Neck: no thyromegaly CV: RRR no murmurs rubs or gallops Lungs: CTAB no crackles, wheeze, rhonchi Abdomen: soft/nontender/nondistended/normal bowel sounds. No rebound or guarding.  Ext: no edema Skin: warm, dry Neuro: grossly normal, moves all extremities, PERRLA Rectal: normal tone, diffusely enlarged prostate, no masses or tenderness  Assessment/Plan:  67 y.o. male presenting for annual physical.  Health Maintenance counseling: 1. Anticipatory guidance: Patient counseled  regarding regular dental exams -q6 months, eye exams -yearly, wearing seatbelts.  2. Risk factor reduction:  Advised patient of need for regular exercise and diet rich and fruits and vegetables to reduce risk of heart attack and stroke. Exercise-last year advised intentional regular 150 minutes a week.  He is not sedentary but did not have exercise regimen in place.  This year states walking some. Diet-weight largely stable from last year- he still wants to lose about 5 pounds.  Wt Readings from Last 3 Encounters:  01/24/18 185 lb 3.2 oz (84 kg)  07/22/17 187 lb 3.2 oz (84.9 kg)  01/23/17 186 lb 9.6 oz (84.6 kg)  3. Immunizations/screenings/ancillary  studies-got final Shingrix shot- will call for final date- asked Roselyn Reef to do so  Immunization History  Administered Date(s) Administered  . Influenza Split 02/02/2011, 01/15/2012, 12/20/2017  . Influenza Whole 12/20/2007, 01/27/2009, 03/01/2010  . Influenza, High Dose Seasonal PF 02/09/2016, 12/25/2016, 12/20/2017  . Influenza,inj,Quad PF,6+ Mos 12/26/2012, 12/31/2013, 01/03/2015  . Pneumococcal Conjugate-13 02/08/2015  . Pneumococcal Polysaccharide-23 12/10/2013  . Td 03/26/2000  . Tdap 10/10/2011  . Zoster 03/01/2010  . Zoster Recombinat (Shingrix) 08/02/2017, 10/19/2017  4. Prostate cancer screening- will update PSA. Some BPH on exam. Luckily minimal symptoms  Lab Results  Component Value Date   PSA 1.59 01/23/2017   PSA 1.22 01/31/2016   PSA 1.31 01/28/2015   5. Colon cancer screening - history of adenomatous colon polyp.  Last colonoscopy 04/09/2016 with 5-year repeat planned 6. Skin cancer screening-goes to Dr. Nevada Crane.advised regular sunscreen use. Denies worrisome, changing, or new skin lesions.  7. Never smoker  Status of chronic or acute concerns   Hypertension-remains controlled on atenolol 50 mg  Hyperlipidemia-remains controlled on atorvastatin 20 mg though he does take it in the morning, update lipids  Insomnia-remains controlled on diazepam 5 mg and Seroquel 12.5 mg.  He is aware of increased mortality potential on Seroquel-likely on low-dose.  This is been the only regimen that has worked for him. Getting 6-7 hours of sleep.   Osteoarthritis of right knee Arthritis of the knee on right-continues on diclofenac/voltaren as needed- has been taking this everyday- discussed trying this more prn. Substantial improvement on this.   Since he is on NSAIDs will need to monitor kidney function every 6 months  Return in about 6 months (around 07/25/2018).  Lab/Order associations: [facility] Preventative health care  Hyperlipidemia, unspecified hyperlipidemia type - Plan: CBC,  Comprehensive metabolic panel, Lipid panel  Screening for prostate cancer - Plan: PSA  Primary osteoarthritis of right knee  Return precautions advised.  Garret Reddish, MD

## 2018-01-24 NOTE — Patient Instructions (Addendum)
Please stop by lab before you go  Glad you are doing so well!  Great to see the day

## 2018-01-24 NOTE — Assessment & Plan Note (Signed)
Arthritis of the knee on right-continues on diclofenac/voltaren as needed- has been taking this everyday- discussed trying this more prn. Substantial improvement on this.   Since he is on NSAIDs will need to monitor kidney function every 6 months

## 2018-01-24 NOTE — Addendum Note (Signed)
Addended by: Marin Olp on: 01/24/2018 10:27 PM   Modules accepted: Orders

## 2018-05-21 ENCOUNTER — Other Ambulatory Visit: Payer: Self-pay | Admitting: Family Medicine

## 2018-07-28 ENCOUNTER — Ambulatory Visit: Payer: Medicare Other | Admitting: Family Medicine

## 2018-08-22 ENCOUNTER — Emergency Department (HOSPITAL_COMMUNITY)
Admission: EM | Admit: 2018-08-22 | Discharge: 2018-08-22 | Disposition: A | Discharge status: Home / Self Care | Payer: Medicare Other | Attending: Emergency Medicine | Admitting: Emergency Medicine

## 2018-08-22 ENCOUNTER — Other Ambulatory Visit: Payer: Self-pay

## 2018-08-22 ENCOUNTER — Encounter (HOSPITAL_COMMUNITY): Payer: Self-pay

## 2018-08-22 DIAGNOSIS — Z23 Encounter for immunization: Secondary | ICD-10-CM | POA: Diagnosis not present

## 2018-08-22 DIAGNOSIS — W540XXA Bitten by dog, initial encounter: Secondary | ICD-10-CM | POA: Diagnosis not present

## 2018-08-22 DIAGNOSIS — S1191XA Laceration without foreign body of unspecified part of neck, initial encounter: Secondary | ICD-10-CM | POA: Diagnosis present

## 2018-08-22 DIAGNOSIS — I10 Essential (primary) hypertension: Secondary | ICD-10-CM | POA: Diagnosis not present

## 2018-08-22 DIAGNOSIS — Z79899 Other long term (current) drug therapy: Secondary | ICD-10-CM | POA: Insufficient documentation

## 2018-08-22 DIAGNOSIS — Y939 Activity, unspecified: Secondary | ICD-10-CM | POA: Insufficient documentation

## 2018-08-22 DIAGNOSIS — Y929 Unspecified place or not applicable: Secondary | ICD-10-CM | POA: Insufficient documentation

## 2018-08-22 DIAGNOSIS — Y999 Unspecified external cause status: Secondary | ICD-10-CM | POA: Insufficient documentation

## 2018-08-22 DIAGNOSIS — S0185XA Open bite of other part of head, initial encounter: Secondary | ICD-10-CM

## 2018-08-22 LAB — CBC WITH DIFFERENTIAL/PLATELET
Abs Immature Granulocytes: 0.01 10*3/uL (ref 0.00–0.07)
Basophils Absolute: 0.1 10*3/uL (ref 0.0–0.1)
Basophils Relative: 1 %
Eosinophils Absolute: 0.2 10*3/uL (ref 0.0–0.5)
Eosinophils Relative: 3 %
HCT: 41.1 % (ref 39.0–52.0)
Hemoglobin: 14.3 g/dL (ref 13.0–17.0)
Immature Granulocytes: 0 %
Lymphocytes Relative: 17 %
Lymphs Abs: 1.4 10*3/uL (ref 0.7–4.0)
MCH: 33.2 pg (ref 26.0–34.0)
MCHC: 34.8 g/dL (ref 30.0–36.0)
MCV: 95.4 fL (ref 80.0–100.0)
Monocytes Absolute: 0.7 10*3/uL (ref 0.1–1.0)
Monocytes Relative: 9 %
Neutro Abs: 5.6 10*3/uL (ref 1.7–7.7)
Neutrophils Relative %: 70 %
Platelets: 248 10*3/uL (ref 150–400)
RBC: 4.31 MIL/uL (ref 4.22–5.81)
RDW: 12.9 % (ref 11.5–15.5)
WBC: 8 10*3/uL (ref 4.0–10.5)
nRBC: 0 % (ref 0.0–0.2)

## 2018-08-22 LAB — BASIC METABOLIC PANEL
Anion gap: 10 (ref 5–15)
BUN: 14 mg/dL (ref 8–23)
CO2: 22 mmol/L (ref 22–32)
Calcium: 9.1 mg/dL (ref 8.9–10.3)
Chloride: 107 mmol/L (ref 98–111)
Creatinine, Ser: 1.49 mg/dL — ABNORMAL HIGH (ref 0.61–1.24)
GFR calc Af Amer: 55 mL/min — ABNORMAL LOW (ref >60)
GFR calc non Af Amer: 47 mL/min — ABNORMAL LOW (ref >60)
Glucose, Bld: 91 mg/dL (ref 70–99)
Potassium: 4.1 mmol/L (ref 3.5–5.1)
Sodium: 139 mmol/L (ref 135–145)

## 2018-08-22 MED ORDER — TETANUS-DIPHTH-ACELL PERTUSSIS 5-2.5-18.5 LF-MCG/0.5 IM SUSP
0.5000 mL | Freq: Once | INTRAMUSCULAR | Status: AC
  Administered 2018-08-22: 19:00:00 0.5 mL via INTRAMUSCULAR
  Filled 2018-08-22: qty 0.5

## 2018-08-22 MED ORDER — CEFAZOLIN SODIUM-DEXTROSE 1-4 GM/50ML-% IV SOLN
1.0000 g | Freq: Once | INTRAVENOUS | Status: AC
  Administered 2018-08-22: 1 g via INTRAVENOUS
  Filled 2018-08-22: qty 50

## 2018-08-22 MED ORDER — LIDOCAINE-EPINEPHRINE (PF) 2 %-1:200000 IJ SOLN
10.0000 mL | Freq: Once | INTRAMUSCULAR | Status: AC
  Administered 2018-08-22: 10 mL via INTRADERMAL
  Filled 2018-08-22: qty 20

## 2018-08-22 MED ORDER — AMOXICILLIN-POT CLAVULANATE 875-125 MG PO TABS: 1.0000 | ORAL_TABLET | Freq: Two times a day (BID) | ORAL | 0 refills | Status: DC

## 2018-08-22 NOTE — ED Notes (Signed)
Dr. Iran Planas @ bedside

## 2018-08-22 NOTE — ED Provider Notes (Signed)
[facility] EMERGENCY DEPARTMENT Provider Note   CSN: [id number] Arrival date & time: 08/22/18  1800    History   Chief Complaint Chief Complaint  Patient presents with  . Animal Bite    HPI Nathan Young is a 68 y.o. male. w/ a PMHx of anxiety, HLD, HTN who presents after a dog bite. States he was in the pen with his daughter's [place] dog when he attacked him. States he was standing up when the dog lunged for his neck biting him. States he only bit him once but he latched on and would not let go. States he only was biting skin. Did no have has jaw around his neck. No difficulty swallowing or breathing.     HPI  Past Medical History:  Diagnosis Date  . ANXIETY 11/07/2006  . HYPERLIPIDEMIA 07/31/2007  . HYPERTENSION 11/07/2006  . OSTEOARTHRITIS 11/07/2006  . Sleep apnea    hasn't used cpap for "10-12 years"    Patient Active Problem List   Diagnosis Date Noted  . History of adenomatous polyp of colon 04/13/2016  . Actinic keratosis 08/08/2015  . Plantar fasciitis of left foot 05/16/2011  . Hyperlipidemia 07/31/2007  . Insomnia 11/07/2006  . Essential hypertension 11/07/2006  . Osteoarthritis of right knee 11/07/2006    Past Surgical History:  Procedure Laterality Date  . CATARACT EXTRACTION     bilateral  . HERNIA REPAIR     2001 Dr Lennie Hummer left groin; right 2012  . KNEE ARTHROSCOPY Right 2008        Home Medications    Prior to Admission medications   Medication Sig Start Date End Date Taking? Authorizing Provider  amoxicillin-clavulanate (AUGMENTIN) 875-125 MG tablet Take 1 tablet by mouth every 12 (twelve) hours. 08/22/18   Doneta Public, MD  atenolol (TENORMIN) 50 MG tablet Take 1 tablet (50 mg total) by mouth daily. 01/24/18   Marin Olp, MD  atorvastatin (LIPITOR) 20 MG tablet Take 1 tablet (20 mg total) by mouth daily. 01/24/18   Marin Olp, MD  diazepam (VALIUM) 5 MG tablet TAKE 1/2 TO 1 TABLET BY MOUTH EVERY DAY  AS NEEDED FOR SLEEP 01/24/18   Marin Olp, MD  diclofenac (VOLTAREN) 75 MG EC tablet TAKE 1 TABLET BY MOUTH 2 TIMES DAILY AS NEEDED. 05/21/18   Marin Olp, MD  QUEtiapine (SEROQUEL) 25 MG tablet TAKE 1/2 TABLET BY MOUTH AT BEDTIME AS NEEDED FOR SLEEP 01/24/18   Marin Olp, MD    Family History Family History  Problem Relation Age of Onset  . Heart disease Mother        pacer; MI late 81s or early 58s  . Hyperlipidemia Mother   . Hypertension Mother   . Colon cancer Neg Hx   . Esophageal cancer Neg Hx   . Stomach cancer Neg Hx   . Rectal cancer Neg Hx     Social History Social History   Tobacco Use  . Smoking status: Never Smoker  . Smokeless tobacco: Never Used  Substance Use Topics  . Alcohol use: No  . Drug use: No     Allergies   Patient has no known allergies.   Review of Systems Review of Systems  Constitutional: Negative for chills and fever.  HENT: Negative for ear pain, sore throat, trouble swallowing and voice change.   Eyes: Negative for pain.  Respiratory: Negative for cough, choking and shortness of breath.   Cardiovascular: Negative for chest pain and  palpitations.  Gastrointestinal: Negative for abdominal pain, diarrhea, nausea and vomiting.  Genitourinary: Negative for dysuria.  Musculoskeletal: Negative for arthralgias and back pain.  Skin: Positive for wound. Negative for color change and rash.  Neurological: Negative for seizures, syncope and headaches.  All other systems reviewed and are negative.    Physical Exam Updated Vital Signs BP 129/86   Pulse 79   Temp 98.4 F (36.9 C) (Oral)   Resp 18   Ht 5\' 10"  (1.778 m)   Wt 81.6 kg   SpO2 94%   BMI 25.83 kg/m   Physical Exam Vitals signs and nursing note reviewed.  Constitutional:      General: He is not in acute distress.    Appearance: Normal appearance. He is well-developed. He is not ill-appearing.  HENT:     Head: Normocephalic.     Comments: Please see  picture below for dog bite Eyes:     Conjunctiva/sclera: Conjunctivae normal.  Neck:     Musculoskeletal: Neck supple.  Cardiovascular:     Rate and Rhythm: Normal rate and regular rhythm.     Heart sounds: No murmur.  Pulmonary:     Effort: Pulmonary effort is normal. No respiratory distress.     Breath sounds: Normal breath sounds.  Abdominal:     Palpations: Abdomen is soft.     Tenderness: There is no abdominal tenderness.  Musculoskeletal:        General: No deformity or signs of injury.  Skin:    General: Skin is warm and dry.  Neurological:     Mental Status: He is alert.        ED Treatments / Results  Labs (all labs ordered are listed, but only abnormal results are displayed) Labs Reviewed  BASIC METABOLIC PANEL - Abnormal; Notable for the following components:      Result Value   Creatinine, Ser 1.49 (*)    GFR calc non Af Amer 47 (*)    GFR calc Af Amer 55 (*)    All other components within normal limits  CBC WITH DIFFERENTIAL/PLATELET    EKG None  Radiology No results found.  Procedures Procedures (including critical care time)  Medications Ordered in ED Medications  Tdap (BOOSTRIX) injection 0.5 mL (0.5 mLs Intramuscular Given 08/22/18 1851)  ceFAZolin (ANCEF) IVPB 1 g/50 mL premix (0 g Intravenous Stopped 08/22/18 1940)  lidocaine-EPINEPHrine (XYLOCAINE W/EPI) 2 %-1:200000 (PF) injection 10 mL (10 mLs Intradermal Given by Other 08/22/18 1920)     Initial Impression / Assessment and Plan / ED Course  I have reviewed the triage vital signs and the nursing notes.  Pertinent labs & imaging results that were available during my care of the patient were reviewed by me and considered in my medical decision making (see chart for details).   Patient presents to the ED after a dog bite to left neck.   On initial exam patient well appearing, not in acute distress. VSS. Physical exam as above (please see picture).  Wound appears to violate the platysma  without injury to underlying structures.  Airway is not involved.  No difficulty with swallowing.  Tdap updated.  Patient given a dose of Ancef in the ED.  Given the complexity of the wound, ENT consulted.  Washout and closure at bedside by ENT.  Patient discharged home with prescription for Augmentin.  Patient was given ENT information to follow-up in their clinic on Monday, which patient states he will do.  Strict return precautions given.  Patient discharged home in stable condition.  Final Clinical Impressions(s) / ED Diagnoses   Final diagnoses:  Dog bite of face, initial encounter    ED Discharge Orders         Ordered    amoxicillin-clavulanate (AUGMENTIN) 875-125 MG tablet  Every 12 hours     08/22/18 2016           Doneta Public, MD 08/22/18 2025    Drenda Freeze, MD 08/23/18 [phone]

## 2018-08-22 NOTE — ED Notes (Signed)
Pt daughter Marcene Brawn [phone]

## 2018-08-22 NOTE — ED Triage Notes (Signed)
Pt has a dog bite to the left side of face about 4in in length

## 2018-08-22 NOTE — Discharge Instructions (Addendum)
Follow up with ENT  

## 2018-08-22 NOTE — ED Notes (Signed)
Nurse Navigator communication: Pt has a cell phone present and will call his wife. He also states his daughter is waiting outside in the car.

## 2018-08-22 NOTE — Consult Note (Signed)
Reason for Consult:dog bite neck Referring Physician: Allie Bossier MD Location: [facility] Date: 5.29.20  Nathan Young is an 68 y.o. male.  HPI: Bit by family dog in neck today. Unsure Td. Per patient all immunizations dog UTD.   Past Medical History:  Diagnosis Date  . ANXIETY 11/07/2006  . HYPERLIPIDEMIA 07/31/2007  . HYPERTENSION 11/07/2006  . OSTEOARTHRITIS 11/07/2006  . Sleep apnea    hasn't used cpap for "10-12 years"    Past Surgical History:  Procedure Laterality Date  . CATARACT EXTRACTION     bilateral  . HERNIA REPAIR     2001 Dr Lennie Hummer left groin; right 2012  . KNEE ARTHROSCOPY Right 2008    Family History  Problem Relation Age of Onset  . Heart disease Mother        pacer; MI late 60s or early 38s  . Hyperlipidemia Mother   . Hypertension Mother   . Colon cancer Neg Hx   . Esophageal cancer Neg Hx   . Stomach cancer Neg Hx   . Rectal cancer Neg Hx     Social History:  reports that he has never smoked. He has never used smokeless tobacco. He reports that he does not drink alcohol or use drugs.  Allergies: No Known Allergies  Medications: I have reviewed the patient's current medications.  Results for orders placed or performed during the hospital encounter of 08/22/18 (from the past 48 hour(s))  CBC with Differential/Platelet     Status: None   Collection Time: 08/22/18  7:00 PM  Result Value Ref Range   WBC 8.0 4.0 - 10.5 K/uL   RBC 4.31 4.22 - 5.81 MIL/uL   Hemoglobin 14.3 13.0 - 17.0 g/dL   HCT 41.1 39.0 - 52.0 %   MCV 95.4 80.0 - 100.0 fL   MCH 33.2 26.0 - 34.0 pg   MCHC 34.8 30.0 - 36.0 g/dL   RDW 12.9 11.5 - 15.5 %   Platelets 248 150 - 400 K/uL   nRBC 0.0 0.0 - 0.2 %   Neutrophils Relative % 70 %   Neutro Abs 5.6 1.7 - 7.7 K/uL   Lymphocytes Relative 17 %   Lymphs Abs 1.4 0.7 - 4.0 K/uL   Monocytes Relative 9 %   Monocytes Absolute 0.7 0.1 - 1.0 K/uL   Eosinophils Relative 3 %   Eosinophils Absolute 0.2 0.0 - 0.5 K/uL   Basophils Relative 1 %   Basophils Absolute 0.1 0.0 - 0.1 K/uL   Immature Granulocytes 0 %   Abs Immature Granulocytes 0.01 0.00 - 0.07 K/uL    Comment: Performed at [facility], [address] [place], [place] [address]  Basic metabolic panel     Status: Abnormal   Collection Time: 08/22/18  7:00 PM  Result Value Ref Range   Sodium 139 135 - 145 mmol/L   Potassium 4.1 3.5 - 5.1 mmol/L   Chloride 107 98 - 111 mmol/L   CO2 22 22 - 32 mmol/L   Glucose, Bld 91 70 - 99 mg/dL   BUN 14 8 - 23 mg/dL   Creatinine, Ser 1.49 (H) 0.61 - 1.24 mg/dL   Calcium 9.1 8.9 - 10.3 mg/dL   GFR calc non Af Amer 47 (L) >60 mL/min   GFR calc Af Amer 55 (L) >60 mL/min   Anion gap 10 5 - 15    Comment: Performed at [facility], [place] [address] [place], [place] [address]  No imaging done  ROS Blood pressure 127/80, pulse 84, temperature 98.4 F (36.9 C), temperature source Oral, resp. rate 18, height 5\' 10"  (1.778 m), weight 81.6 kg, SpO2 95 %. Physical Exam  Gen: alert oriented NAD HEENT:  Open wound left neck below mandibular border with anteriorly based avulstion flap, through SQ tissue no active bleeding CN II-XII intact equal  Assessment/Plan: Plan repair in ED. Ok to shower 5.30.20, soap and water ok. Light activities (no yard work, swimming, carpentry) through follow up. Ice packs ok for pain. Recommend Augmentin upon d/c and update Td. F/u 7-10 d in clinic, provided contact information. Counseled skin will look gathered for weeks and relax on own. Recommend electric shaver as will be numb in area for months. Avoid shaving area through follow up visit.  Procedure Note Pre/post procedure diagnosis: dog bite left neck Procedure: complex repair left nedk 6 cm Local anesthesia total 6 ml 1% lidocaine with epi  Following infiltration wound margins with local anesthesia, prepped with Betadine. Wound irrigated. Avulsion flap excised. Skin margins excised to fresh tissue. Layered closure  completed with 4-0 monocryl dermis interrupted and 4-0 monocrl short running and interrupted skin closure, length 6 cm. Tolerated well.   Irene Limbo, MD [facility] Plastic & Reconstructive Surgery [phone], pin [phone]

## 2018-09-02 ENCOUNTER — Other Ambulatory Visit: Payer: Self-pay | Admitting: Family Medicine

## 2018-09-03 NOTE — Telephone Encounter (Signed)
Last OV 01/24/18 Last refill 01/24/18 #30/5 Next OV 01/27/19  Forwarding to Dr. Yong Channel

## 2018-09-03 NOTE — Telephone Encounter (Signed)
I recommended 72-month follow-up at last visit-would be reasonable to do virtual visit if he is agreeable or in person if not able to do video

## 2018-09-04 NOTE — Telephone Encounter (Signed)
Left message with patients wife to have Nathan Young call the office in regards to scheduling a follow up appointment.

## 2018-09-05 NOTE — Telephone Encounter (Signed)
Contact pt to schedule f/u appointment.

## 2018-09-08 NOTE — Telephone Encounter (Signed)
Called pt and scheduled for this coming Thursday in office.

## 2018-09-11 ENCOUNTER — Encounter: Payer: Self-pay | Admitting: Family Medicine

## 2018-09-11 ENCOUNTER — Ambulatory Visit (INDEPENDENT_AMBULATORY_CARE_PROVIDER_SITE_OTHER): Payer: Medicare Other | Admitting: Family Medicine

## 2018-09-11 ENCOUNTER — Other Ambulatory Visit: Payer: Self-pay

## 2018-09-11 VITALS — BP 126/84 | HR 65 | Temp 98.5°F | Ht 70.0 in | Wt 189.0 lb

## 2018-09-11 DIAGNOSIS — W540XXA Bitten by dog, initial encounter: Secondary | ICD-10-CM

## 2018-09-11 DIAGNOSIS — G4700 Insomnia, unspecified: Secondary | ICD-10-CM

## 2018-09-11 DIAGNOSIS — I10 Essential (primary) hypertension: Secondary | ICD-10-CM

## 2018-09-11 DIAGNOSIS — E785 Hyperlipidemia, unspecified: Secondary | ICD-10-CM | POA: Diagnosis not present

## 2018-09-11 DIAGNOSIS — S1190XA Unspecified open wound of unspecified part of neck, initial encounter: Secondary | ICD-10-CM | POA: Diagnosis not present

## 2018-09-11 DIAGNOSIS — N281 Cyst of kidney, acquired: Secondary | ICD-10-CM

## 2018-09-11 LAB — BASIC METABOLIC PANEL
BUN: 20 mg/dL (ref 6–23)
CO2: 26 mEq/L (ref 19–32)
Calcium: 9.5 mg/dL (ref 8.4–10.5)
Chloride: 105 mEq/L (ref 96–112)
Creatinine, Ser: 1.73 mg/dL — ABNORMAL HIGH (ref 0.40–1.50)
GFR: 39.26 mL/min — ABNORMAL LOW (ref >60.00)
Glucose, Bld: 85 mg/dL (ref 70–99)
Potassium: 5.3 mEq/L — ABNORMAL HIGH (ref 3.5–5.1)
Sodium: 139 mEq/L (ref 135–145)

## 2018-09-11 LAB — LDL CHOLESTEROL, DIRECT: Direct LDL: 79 mg/dL

## 2018-09-11 MED ORDER — DIAZEPAM 5 MG PO TABS: ORAL_TABLET | ORAL | 5 refills | Status: DC

## 2018-09-11 NOTE — Progress Notes (Signed)
Phone [phone]   Subjective:  Nathan Young is a 68 y.o. year old very pleasant male patient who presents for/with See problem oriented charting Chief Complaint  Patient presents with  . Follow-up  . Hypertension  . Hyperlipidemia  . Insomnia   ROS-  . Denies dizziness, visual changes, CP, SOB.   Past Medical History-  Patient Active Problem List   Diagnosis Date Noted  . Hyperlipidemia 07/31/2007    Priority: Medium  . Insomnia 11/07/2006    Priority: Medium  . Essential hypertension 11/07/2006    Priority: Medium  . History of adenomatous polyp of colon 04/13/2016    Priority: Low  . Actinic keratosis 08/08/2015    Priority: Low  . Plantar fasciitis of left foot 05/16/2011    Priority: Low  . Osteoarthritis of right knee 11/07/2006    Priority: Low    Medications- reviewed and updated Current Outpatient Medications  Medication Sig Dispense Refill  . atenolol (TENORMIN) 50 MG tablet Take 1 tablet (50 mg total) by mouth daily. 90 tablet 3  . atorvastatin (LIPITOR) 20 MG tablet Take 1 tablet (20 mg total) by mouth daily. 90 tablet 3  . diclofenac (VOLTAREN) 75 MG EC tablet TAKE 1 TABLET BY MOUTH 2 TIMES DAILY AS NEEDED. 60 tablet 2  . QUEtiapine (SEROQUEL) 25 MG tablet TAKE 1/2 TABLET BY MOUTH AT BEDTIME AS NEEDED FOR SLEEP 45 tablet 3  . diazepam (VALIUM) 5 MG tablet TAKE 1/2 TO 1 TABLET BY MOUTH EVERY DAY AS NEEDED FOR SLEEP 30 tablet 5   No current facility-administered medications for this visit.      Objective:  BP 126/84 (BP Location: Left Arm, Patient Position: Sitting, Cuff Size: Normal)   Pulse 65   Temp 98.5 F (36.9 C) (Oral)   Ht 5\' 10"  (1.778 m)   Wt 189 lb (85.7 kg)   SpO2 96%   BMI 27.12 kg/m  Gen: NAD, resting comfortably CV: RRR no murmurs rubs or gallops Lungs: CTAB no crackles, wheeze, rhonchi Abdomen: soft/nontender Ext: no edema Skin: warm, dry, healing scar on left neck- very small peraps 1-26mm suture visible- feels taught  Neuro: normal gait and speech    Assessment and Plan   #hypertension S: controlled on Atenolol 50 mg daily, tolerating well, no side effects. Checks BP at home, has been consistently below 140/90. Reports occasional HA, BP not typically elevated while having HA BP Readings from Last 3 Encounters:  09/11/18 126/84  08/22/18 129/86  01/24/18 122/84  A/P: Stable. Continue current medications.    #hyperlipidemia S:  controlled on Atorvastatin 20 mg daily. Denies myalgia or other side effects. Reports myalgia in the past but this has resolved since he started taking medication in the morning.  Lab Results  Component Value Date   CHOL 120 01/24/2018   HDL 31.60 (L) 01/24/2018   LDLCALC 66 01/24/2018   LDLDIRECT 72.0 07/22/2017   TRIG 111.0 01/24/2018   CHOLHDL 4 01/24/2018   A/P: Excellent control-continue current medications. Check ldl  # Insomnia S:Taking Seroquel 25 mg 1/2 tablet at bed time, Diazepam 5 mg 1/2-1 tablet at bedtime.  Reports some difficulty falling asleep but typically gets about 7 hours of uninterrupted sleep.  -tried to go without diazepam but after 2 nights could not sleep well at all - Reminded patient of increased mortality risk on Seroquel but this is been the only tolerable combination despite multiple prior attempts A/P: Stable. Continue current medications.     # Dog Bite  S:Dog bit him about 3 weeks ago on the left side of the face. He was seen at the ED, got Td immunization, and blood work. Had plastic surgeon repair. Would like to have this looked at to make sure all the stitches are out.  A/P: doing well from dog bite- dog was taken by animal control (was up to date on rabies) but was a shelter dog and now back at shelter. There is a very small area of subcutaneous suture noted- not sure this could be removed as only small opening- encouraged him to call plastic surgeon for their opiniion   #takes diclofenac for knees- mostly once a day. Last creatinine  appeared to increase- we may need to stop and use tylenol only though may not help as much .   Future Appointments  Date Time Provider [place]  01/27/2019  9:20 AM Marin Olp, MD [facility]   Lab/Order associations:   ICD-10-CM   1. Essential hypertension  I10   2. Hyperlipidemia, unspecified hyperlipidemia type  E78.5   3. Insomnia, unspecified type  G47.00   4. Dog bite of neck, initial encounter  S11.90XA    W54.0XXA    Meds ordered this encounter  Medications  . diazepam (VALIUM) 5 MG tablet    Sig: TAKE 1/2 TO 1 TABLET BY MOUTH EVERY DAY AS NEEDED FOR SLEEP    Dispense:  30 tablet    Refill:  5   Return precautions advised.  Garret Reddish, MD

## 2018-09-11 NOTE — Addendum Note (Signed)
Addended by: Marin Olp on: 09/11/2018 09:30 PM   Modules accepted: Orders

## 2018-09-11 NOTE — Patient Instructions (Addendum)
Glad you made it through that scary accident! Glad the dog is gone!   No changes in medicine today   Please stop by lab before you go If you do not have mychart- we will call you about results within 5 business days of [place] receiving them.  If you have mychart- we will send your results within 3 business days of [place] receiving them.  If abnormal or we want to clarify a result, we will call or mychart you to make sure you receive the message.  If you have questions or concerns or don't hear within 5-7 days, please send [place] a message or call us.

## 2018-09-12 ENCOUNTER — Other Ambulatory Visit: Payer: Self-pay | Admitting: Physical Therapy

## 2018-09-12 DIAGNOSIS — R7989 Other specified abnormal findings of blood chemistry: Secondary | ICD-10-CM

## 2018-09-25 ENCOUNTER — Other Ambulatory Visit (INDEPENDENT_AMBULATORY_CARE_PROVIDER_SITE_OTHER): Payer: Medicare Other

## 2018-09-25 ENCOUNTER — Other Ambulatory Visit: Payer: Self-pay

## 2018-09-25 DIAGNOSIS — R7989 Other specified abnormal findings of blood chemistry: Secondary | ICD-10-CM | POA: Diagnosis not present

## 2018-09-25 LAB — BASIC METABOLIC PANEL
BUN: 33 mg/dL — ABNORMAL HIGH (ref 6–23)
CO2: 25 mEq/L (ref 19–32)
Calcium: 9 mg/dL (ref 8.4–10.5)
Chloride: 108 mEq/L (ref 96–112)
Creatinine, Ser: 2.07 mg/dL — ABNORMAL HIGH (ref 0.40–1.50)
GFR: 31.91 mL/min — ABNORMAL LOW (ref >60.00)
Glucose, Bld: 89 mg/dL (ref 70–99)
Potassium: 4.8 mEq/L (ref 3.5–5.1)
Sodium: 141 mEq/L (ref 135–145)

## 2018-09-25 NOTE — Addendum Note (Signed)
Addended by: Marin Olp on: 09/25/2018 10:52 PM   Modules accepted: Orders

## 2018-09-25 NOTE — Progress Notes (Signed)
Updating evaluation for kidney function

## 2018-09-25 NOTE — Addendum Note (Signed)
Addended by: Marin Olp on: 09/25/2018 10:49 PM   Modules accepted: Orders

## 2018-09-29 ENCOUNTER — Telehealth: Payer: Self-pay | Admitting: Family Medicine

## 2018-09-29 ENCOUNTER — Other Ambulatory Visit: Payer: Self-pay

## 2018-09-29 ENCOUNTER — Other Ambulatory Visit (INDEPENDENT_AMBULATORY_CARE_PROVIDER_SITE_OTHER): Payer: Medicare Other

## 2018-09-29 DIAGNOSIS — R7989 Other specified abnormal findings of blood chemistry: Secondary | ICD-10-CM

## 2018-09-29 LAB — URINALYSIS
Bilirubin Urine: NEGATIVE
Hgb urine dipstick: NEGATIVE
Ketones, ur: NEGATIVE
Leukocytes,Ua: NEGATIVE
Nitrite: NEGATIVE
Specific Gravity, Urine: 1.025 (ref 1.000–1.030)
Total Protein, Urine: NEGATIVE
Urine Glucose: NEGATIVE
Urobilinogen, UA: 0.2 (ref 0.0–1.0)
pH: 5.5 (ref 5.0–8.0)

## 2018-09-29 LAB — BASIC METABOLIC PANEL
BUN: 19 mg/dL (ref 6–23)
CO2: 25 mEq/L (ref 19–32)
Calcium: 9.2 mg/dL (ref 8.4–10.5)
Chloride: 106 mEq/L (ref 96–112)
Creatinine, Ser: 1.7 mg/dL — ABNORMAL HIGH (ref 0.40–1.50)
GFR: 40.06 mL/min — ABNORMAL LOW (ref >60.00)
Glucose, Bld: 94 mg/dL (ref 70–99)
Potassium: 4.4 mEq/L (ref 3.5–5.1)
Sodium: 140 mEq/L (ref 135–145)

## 2018-09-29 LAB — MICROALBUMIN / CREATININE URINE RATIO
Creatinine,U: 160.3 mg/dL
Microalb Creat Ratio: 3 mg/g (ref 0.0–30.0)
Microalb, Ur: 4.8 mg/dL — ABNORMAL HIGH (ref 0.0–1.9)

## 2018-09-29 NOTE — Telephone Encounter (Signed)
See note  Copied from [place] [phone]. Topic: Appointment Scheduling - Scheduling Inquiry for Clinic >> Sep 29, 2018 10:32 AM Valla Leaver wrote: Reason for CRM: Patient would like to schedule labs today if possible

## 2018-09-29 NOTE — Telephone Encounter (Signed)
Spoke to pt and he has been scheduled

## 2018-10-01 ENCOUNTER — Other Ambulatory Visit: Payer: Self-pay

## 2018-10-01 ENCOUNTER — Ambulatory Visit
Admission: RE | Admit: 2018-10-01 | Discharge: 2018-10-01 | Disposition: A | Discharge status: Home / Self Care | Payer: Medicare Other | Source: Ambulatory Visit | Attending: Family Medicine | Admitting: Family Medicine

## 2018-10-01 DIAGNOSIS — R7989 Other specified abnormal findings of blood chemistry: Secondary | ICD-10-CM

## 2018-10-02 NOTE — Addendum Note (Signed)
Addended by: Marin Olp on: 10/02/2018 09:35 PM   Modules accepted: Orders

## 2018-10-10 ENCOUNTER — Telehealth: Payer: Self-pay

## 2018-10-10 NOTE — Telephone Encounter (Signed)
I already responded to this   "Notes recorded by Marin Olp, MD on 09/30/2018 at 9:01 PM EDT  Yes should be okay to use Voltaren gel as needed-would try to do it as sparingly as possible just in case there is any systemic absorption "  Appears when patient called on 10/03/2018 my prior message was not relayed to patient.  Please apologize to him for this delay

## 2018-10-10 NOTE — Telephone Encounter (Signed)
Called and spoke with wife about MRI being scheduled and why it took so long to get scheduled.  No further action required.

## 2018-10-10 NOTE — Telephone Encounter (Signed)
Spoke with wife, she stated that they never heard back about if it was OK to use Voltaren.   Notes recorded by Franco Collet, CMA on 10/03/2018 at 10:31 AM EDT  Patient also wants to know if it is okay to use the Voltaren cream for his knee. Pt stated he has some at home but was taking off of the pill form due to an issue.   Please advise

## 2018-10-10 NOTE — Telephone Encounter (Signed)
Copied from [place]. Topic: General - Call Back - No Documentation >> Oct 10, 2018  9:28 AM Erick Blinks wrote: Reason for CRM: [phone] VM okay, requesting call back to discuss upcoming MRI. Pt has questions. Please advise

## 2018-10-13 NOTE — Telephone Encounter (Signed)
Called pt and left VM to call the office.  

## 2018-10-13 NOTE — Telephone Encounter (Signed)
Called pt 2x to update him of the Voltaren. There was no answer. Please see result notes from 09/30/2018.

## 2018-10-23 ENCOUNTER — Ambulatory Visit
Admission: RE | Admit: 2018-10-23 | Discharge: 2018-10-23 | Disposition: A | Discharge status: Home / Self Care | Payer: Medicare Other | Source: Ambulatory Visit | Attending: Family Medicine | Admitting: Family Medicine

## 2018-10-23 DIAGNOSIS — N281 Cyst of kidney, acquired: Secondary | ICD-10-CM

## 2018-10-23 MED ORDER — GADOBENATE DIMEGLUMINE 529 MG/ML IV SOLN
17.0000 mL | Freq: Once | INTRAVENOUS | Status: AC | PRN
  Administered 2018-10-23: 17 mL via INTRAVENOUS

## 2018-10-28 ENCOUNTER — Telehealth: Payer: Self-pay | Admitting: Family Medicine

## 2018-10-28 NOTE — Telephone Encounter (Signed)
Copied from [place] [phone]. Topic: General - Other >> Oct 28, 2018  9:24 AM Leward Quan A wrote: Reason for CRM: Patient is requesting a call back from Dr Yong Channel about his MRI done on 10/23/2018 please advise. Can be reached at Ph# [phone]

## 2018-10-28 NOTE — Telephone Encounter (Signed)
Notes recorded by Sandford Craze, RN on 10/28/2018 at 4:36 PM EDT  Notified pt of MRI results pt. Verbalized understanding

## 2018-12-11 ENCOUNTER — Ambulatory Visit (INDEPENDENT_AMBULATORY_CARE_PROVIDER_SITE_OTHER): Payer: Medicare Other

## 2018-12-11 ENCOUNTER — Other Ambulatory Visit: Payer: Self-pay

## 2018-12-11 DIAGNOSIS — Z23 Encounter for immunization: Secondary | ICD-10-CM

## 2019-01-21 ENCOUNTER — Telehealth: Payer: Self-pay | Admitting: Family Medicine

## 2019-01-21 NOTE — Telephone Encounter (Signed)
I called the patient to schedule AWV with Loma Sousa (health coach), but there was no answer and no option to leave a message. If patient calls back, please schedule Medicare Wellness Visit (initial) at next available opening.  VDM (Dee-Dee)

## 2019-01-27 ENCOUNTER — Encounter: Payer: Self-pay | Admitting: Family Medicine

## 2019-01-27 ENCOUNTER — Ambulatory Visit (INDEPENDENT_AMBULATORY_CARE_PROVIDER_SITE_OTHER): Payer: Medicare Other | Admitting: Family Medicine

## 2019-01-27 ENCOUNTER — Other Ambulatory Visit: Payer: Self-pay

## 2019-01-27 VITALS — BP 138/84 | HR 70 | Temp 98.5°F | Ht 70.0 in | Wt 190.0 lb

## 2019-01-27 DIAGNOSIS — I10 Essential (primary) hypertension: Secondary | ICD-10-CM

## 2019-01-27 DIAGNOSIS — E785 Hyperlipidemia, unspecified: Secondary | ICD-10-CM

## 2019-01-27 DIAGNOSIS — G4700 Insomnia, unspecified: Secondary | ICD-10-CM

## 2019-01-27 DIAGNOSIS — Z0000 Encounter for general adult medical examination without abnormal findings: Secondary | ICD-10-CM

## 2019-01-27 DIAGNOSIS — Z86010 Personal history of colonic polyps: Secondary | ICD-10-CM

## 2019-01-27 DIAGNOSIS — M1711 Unilateral primary osteoarthritis, right knee: Secondary | ICD-10-CM

## 2019-01-27 LAB — COMPREHENSIVE METABOLIC PANEL
ALT: 21 U/L (ref 0–53)
AST: 19 U/L (ref 0–37)
Albumin: 4.5 g/dL (ref 3.5–5.2)
Alkaline Phosphatase: 45 U/L (ref 39–117)
BUN: 15 mg/dL (ref 6–23)
CO2: 27 mEq/L (ref 19–32)
Calcium: 9.4 mg/dL (ref 8.4–10.5)
Chloride: 104 mEq/L (ref 96–112)
Creatinine, Ser: 1.35 mg/dL (ref 0.40–1.50)
GFR: 52.21 mL/min — ABNORMAL LOW (ref >60.00)
Glucose, Bld: 96 mg/dL (ref 70–99)
Potassium: 4.7 mEq/L (ref 3.5–5.1)
Sodium: 139 mEq/L (ref 135–145)
Total Bilirubin: 0.7 mg/dL (ref 0.2–1.2)
Total Protein: 7.3 g/dL (ref 6.0–8.3)

## 2019-01-27 LAB — LIPID PANEL
Cholesterol: 127 mg/dL (ref 0–200)
HDL: 32 mg/dL — ABNORMAL LOW (ref >39.00)
LDL Cholesterol: 70 mg/dL (ref 0–99)
NonHDL: 95.37
Total CHOL/HDL Ratio: 4
Triglycerides: 127 mg/dL (ref 0.0–149.0)
VLDL: 25.4 mg/dL (ref 0.0–40.0)

## 2019-01-27 LAB — CBC WITH DIFFERENTIAL/PLATELET
Basophils Absolute: 0.1 10*3/uL (ref 0.0–0.1)
Basophils Relative: 0.8 % (ref 0.0–3.0)
Eosinophils Absolute: 0.3 10*3/uL (ref 0.0–0.7)
Eosinophils Relative: 4.5 % (ref 0.0–5.0)
HCT: 42.2 % (ref 39.0–52.0)
Hemoglobin: 14.4 g/dL (ref 13.0–17.0)
Lymphocytes Relative: 22.2 % (ref 12.0–46.0)
Lymphs Abs: 1.7 10*3/uL (ref 0.7–4.0)
MCHC: 34.1 g/dL (ref 30.0–36.0)
MCV: 98.8 fl (ref 78.0–100.0)
Monocytes Absolute: 0.6 10*3/uL (ref 0.1–1.0)
Monocytes Relative: 8.3 % (ref 3.0–12.0)
Neutro Abs: 4.9 10*3/uL (ref 1.4–7.7)
Neutrophils Relative %: 64.2 % (ref 43.0–77.0)
Platelets: 257 10*3/uL (ref 150.0–400.0)
RBC: 4.28 Mil/uL (ref 4.22–5.81)
RDW: 13.1 % (ref 11.5–15.5)
WBC: 7.7 10*3/uL (ref 4.0–10.5)

## 2019-01-27 MED ORDER — DICLOFENAC SODIUM 1 % TD GEL: 4.0000 g | Freq: Four times a day (QID) | TRANSDERMAL | 3 refills | Status: DC

## 2019-01-27 NOTE — Progress Notes (Signed)
Phone: [phone]   Subjective:  Patient presents today for their annual physical. Chief complaint-noted.   See problem oriented charting- ROS- full  review of systems was completed and negative  except for:  Bilateral knee pain  The following were reviewed and entered/updated in epic: Past Medical History:  Diagnosis Date  . ANXIETY 11/07/2006  . HYPERLIPIDEMIA 07/31/2007  . HYPERTENSION 11/07/2006  . OSTEOARTHRITIS 11/07/2006  . Sleep apnea    hasn't used cpap for "10-12 years"   Patient Active Problem List   Diagnosis Date Noted  . Hyperlipidemia 07/31/2007    Priority: Medium  . Insomnia 11/07/2006    Priority: Medium  . Essential hypertension 11/07/2006    Priority: Medium  . History of adenomatous polyp of colon 04/13/2016    Priority: Low  . Actinic keratosis 08/08/2015    Priority: Low  . Plantar fasciitis of left foot 05/16/2011    Priority: Low  . Osteoarthritis of right knee 11/07/2006    Priority: Low   Past Surgical History:  Procedure Laterality Date  . CATARACT EXTRACTION     bilateral  . HERNIA REPAIR     2001 Dr Lennie Hummer left groin; right 2012  . KNEE ARTHROSCOPY Right 2008    Family History  Problem Relation Age of Onset  . Heart disease Mother        pacer; MI late 15s or early 53s  . Hyperlipidemia Mother   . Hypertension Mother   . Colon cancer Neg Hx   . Esophageal cancer Neg Hx   . Stomach cancer Neg Hx   . Rectal cancer Neg Hx     Medications- reviewed and updated Current Outpatient Medications  Medication Sig Dispense Refill  . atenolol (TENORMIN) 50 MG tablet Take 1 tablet (50 mg total) by mouth daily. 90 tablet 3  . atorvastatin (LIPITOR) 20 MG tablet Take 1 tablet (20 mg total) by mouth daily. 90 tablet 3  . diazepam (VALIUM) 5 MG tablet TAKE 1/2 TO 1 TABLET BY MOUTH EVERY DAY AS NEEDED FOR SLEEP 30 tablet 5  . QUEtiapine (SEROQUEL) 25 MG tablet TAKE 1/2 TABLET BY MOUTH AT BEDTIME AS NEEDED FOR SLEEP 45 tablet 3   No current  facility-administered medications for this visit.     Allergies-reviewed and updated No Known Allergies  Social History   Social History Narrative   Married 1977. [place]. [place]. 2 grandkids from [place] 2005, [place] 2007.       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    Objective  Objective:  BP 138/84   Pulse 70   Temp 98.5 F (36.9 C)   Ht 5\' 10"  (1.778 m)   Wt 190 lb (86.2 kg)   SpO2 96%   BMI 27.26 kg/m  Gen: NAD, resting comfortably HEENT: Mask not removed due to covid 19. TM normal. Bridge of nose normal. Eyelids normal.  Neck: no thyromegaly or cervical lymphadenopathy  CV: RRR no murmurs rubs or gallops Lungs: CTAB no crackles, wheeze, rhonchi Abdomen: soft/nontender/nondistended/normal bowel sounds. No rebound or guarding.  Ext: no edema Skin: warm, dry Neuro: grossly normal, moves all extremities, PERRLA    Assessment and Plan  68 y.o. male presenting for annual physical.  Health Maintenance counseling: 1. Anticipatory guidance: Patient counseled regarding regular dental exams -q6 months, eye exams -yearly,  avoiding smoking and second hand smoke , limiting alcohol to 2 beverages per day .  2. Risk factor reduction:  Advised patient of need for regular exercise and diet rich and fruits and vegetables to reduce risk of heart attack and stroke. Exercise-  Wants to walk more but his knee bothers him, remains active around the house and doing side jobs for remodeling. Diet-  Weight up 5 pounds from last year- trying to eat reasonably healthy and states on home scales stable over last 3 years. Has cut back on mountain dew. Trying to drink a lot of water.  Wt Readings from Last 3 Encounters:  01/27/19 190 lb (86.2 kg)  09/11/18 189 lb (85.7 kg)  08/22/18 180 lb (81.6 kg)  3. Immunizations/screenings/ancillary studies- up to date Immunization History  Administered Date(s) Administered  . Fluad  Quad(high Dose 65+) 12/11/2018  . Influenza Split 02/02/2011, 01/15/2012, 12/20/2017  . Influenza Whole 12/20/2007, 01/27/2009, 03/01/2010  . Influenza, High Dose Seasonal PF 02/09/2016, 12/25/2016, 12/20/2017  . Influenza,inj,Quad PF,6+ Mos 12/26/2012, 12/31/2013, 01/03/2015  . Pneumococcal Conjugate-13 02/08/2015  . Pneumococcal Polysaccharide-23 12/10/2013  . Td 03/26/2000  . Tdap 10/10/2011, 08/22/2018  . Zoster 03/01/2010  . Zoster Recombinat (Shingrix) 08/02/2017, 10/19/2017  4. Prostate cancer screening- passed age based screening recommendations - he opted to discontinue due to current guidelines.  Lab Results  Component Value Date   PSA 1.46 01/24/2018   PSA 1.59 01/23/2017   PSA 1.22 01/31/2016   5. Colon cancer screening -  1/15/2018History of adenomatous polyp of colon with 5 year repeat planned 6. Skin cancer screening- has seen derm a few years ago but no regular vistis. advised regular sunscreen use. Denies worrisome, changing, or new skin lesions.  7. Never smoker  Status of chronic or acute concerns   Hyperlipidemia, unspecified hyperlipidemia type-compliant with atorvastatin 20 mg.  Update lipid panel with LDL goal at least under 100 but we discussed ideal goal under 70 and he was at that ideal goal last year.   Insomnia, unspecified type-placed on multiple therapies in the past and only thing that has been effective in Seroquel 12.5 mg and Valium 2.5 mg combination together.  He has tolerated this well.  Essential hypertension-compliant with atenolol 50 mg. Stable, continue current meds. Home readings 120/75 on average so will not make adjustments.   Primary osteoarthritis of right knee- from CMA intake "Pt states he is still having knee pain and you had him on Diclofenac and he felt that helped tremendously but you took him off due to liver function. "  Patient had significant worsening of kidney function in fact-GFR was down under 40 months ago.improved slightly off  meds.  - we are going to try voltaren gel  If kidney functoin is at least stablebut definitely check kidney function in 6 months.  - could consider knee PT but he is already very active - had injections years ago and didn't work.  -does use tylenol arthritis  POSSIBLE CKD Stage III- may have been related to prior oral nsaid- we are going to check today and would add diagnosis next visit if still present but my hope is kidney function is improving.   Recommended follow up: 6 months follow up   Lab/Order associations: fasting  No orders of the defined types were placed in this encounter.   Return precautions advised.  Garret Reddish, MD

## 2019-01-27 NOTE — Patient Instructions (Addendum)
Try voltaren gel as long as kidney function is stable on exam today  Glad you are doing well!   Please stop by lab before you go If you do not have mychart- we will call you about results within 5 business days of [place] receiving them.  If you have mychart- we will send your results within 3 business days of [place] receiving them.  If abnormal or we want to clarify a result, we will call or mychart you to make sure you receive the message.  If you have questions or concerns or don't hear within 5-7 days, please send [place] a message or call us.

## 2019-02-10 ENCOUNTER — Other Ambulatory Visit: Payer: Self-pay | Admitting: Family Medicine

## 2019-03-26 ENCOUNTER — Encounter (HOSPITAL_COMMUNITY): Payer: Self-pay

## 2019-03-26 ENCOUNTER — Emergency Department (HOSPITAL_COMMUNITY): Payer: Medicare Other

## 2019-03-26 ENCOUNTER — Emergency Department (HOSPITAL_COMMUNITY)
Admission: EM | Admit: 2019-03-26 | Discharge: 2019-03-26 | Disposition: A | Discharge status: Home / Self Care | Payer: Medicare Other | Attending: Emergency Medicine | Admitting: Emergency Medicine

## 2019-03-26 ENCOUNTER — Ambulatory Visit: Payer: Self-pay | Admitting: *Deleted

## 2019-03-26 ENCOUNTER — Ambulatory Visit (INDEPENDENT_AMBULATORY_CARE_PROVIDER_SITE_OTHER)
Admission: EM | Admit: 2019-03-26 | Discharge: 2019-03-26 | Disposition: A | Discharge status: Other Acute Inpatient Hospital | Payer: Medicare Other | Source: Home / Self Care | Attending: Internal Medicine | Admitting: Internal Medicine

## 2019-03-26 ENCOUNTER — Other Ambulatory Visit: Payer: Self-pay

## 2019-03-26 DIAGNOSIS — R0789 Other chest pain: Secondary | ICD-10-CM | POA: Insufficient documentation

## 2019-03-26 DIAGNOSIS — I10 Essential (primary) hypertension: Secondary | ICD-10-CM | POA: Insufficient documentation

## 2019-03-26 DIAGNOSIS — Z79899 Other long term (current) drug therapy: Secondary | ICD-10-CM | POA: Diagnosis not present

## 2019-03-26 DIAGNOSIS — R079 Chest pain, unspecified: Secondary | ICD-10-CM | POA: Diagnosis not present

## 2019-03-26 LAB — CBC
HCT: 44.8 % (ref 39.0–52.0)
Hemoglobin: 15.7 g/dL (ref 13.0–17.0)
MCH: 34.1 pg — ABNORMAL HIGH (ref 26.0–34.0)
MCHC: 35 g/dL (ref 30.0–36.0)
MCV: 97.4 fL (ref 80.0–100.0)
Platelets: 294 10*3/uL (ref 150–400)
RBC: 4.6 MIL/uL (ref 4.22–5.81)
RDW: 12.8 % (ref 11.5–15.5)
WBC: 8.7 10*3/uL (ref 4.0–10.5)
nRBC: 0 % (ref 0.0–0.2)

## 2019-03-26 LAB — BASIC METABOLIC PANEL
Anion gap: 9 (ref 5–15)
BUN: 17 mg/dL (ref 8–23)
CO2: 23 mmol/L (ref 22–32)
Calcium: 9.6 mg/dL (ref 8.9–10.3)
Chloride: 104 mmol/L (ref 98–111)
Creatinine, Ser: 1.4 mg/dL — ABNORMAL HIGH (ref 0.61–1.24)
GFR calc Af Amer: 59 mL/min — ABNORMAL LOW (ref >60)
GFR calc non Af Amer: 51 mL/min — ABNORMAL LOW (ref >60)
Glucose, Bld: 161 mg/dL — ABNORMAL HIGH (ref 70–99)
Potassium: 4.5 mmol/L (ref 3.5–5.1)
Sodium: 136 mmol/L (ref 135–145)

## 2019-03-26 LAB — TROPONIN I (HIGH SENSITIVITY)
Troponin I (High Sensitivity): 3 ng/L (ref <18)
Troponin I (High Sensitivity): <2 ng/L (ref <18)

## 2019-03-26 MED ORDER — SODIUM CHLORIDE 0.9% FLUSH: 3.0000 mL | Freq: Once | INTRAVENOUS | Status: DC

## 2019-03-26 MED ORDER — IOHEXOL 350 MG/ML SOLN
100.0000 mL | Freq: Once | INTRAVENOUS | Status: AC | PRN
  Administered 2019-03-26: 100 mL via INTRAVENOUS

## 2019-03-26 NOTE — ED Notes (Signed)
Pt ambulated to restroom with steady gait. Pt returned to bed and is on monitors.

## 2019-03-26 NOTE — ED Triage Notes (Signed)
Pt presents with c/o intermittent CP over the last few days. Pt states he has been under a lot of stress in the last few weeks with the death of his mother and the hospitalization of his father. Pt also endorses hypertension. States he has been checking his BP at home with readings of 215/170. States he has been taking his BP meds regularly. Pt denies SOB, NVD.

## 2019-03-26 NOTE — ED Notes (Signed)
Patient transported to X-ray 

## 2019-03-26 NOTE — ED Provider Notes (Signed)
  Provider Note MRN:  [id number]  Arrival date & time: 03/26/19    ED Course and Medical Decision Making  Assumed care from Dr. Tomi Bamberger at shift change.  Chest pain, favored noncardiac, awaiting CT imaging of the aorta, ordered in response to x-ray results.  Without acute findings patient likely appropriate for discharge.  7:30 PM update: CT scan is normal.  Troponins negative.  Patient is feeling well and requesting discharge.  Will follow up with PCP.  Procedures  Final Clinical Impressions(s) / ED Diagnoses     ICD-10-CM   1. Chest pain, unspecified type  R07.9     ED Discharge Orders    None        Discharge Instructions     You were evaluated in the Emergency Department and after careful evaluation, we did not find any emergent condition requiring admission or further testing in the hospital.  Your exam/testing today was overall reassuring.  CT scan did not show any abnormalities.  Please return to the Emergency Department if you experience any worsening of your condition.  We encourage you to follow up with a primary care provider.  Thank you for allowing [place] to be a part of your care.      Barth Kirks. [place]onia Small, [place] [email or web address]    Maudie Flakes, MD 03/26/19 [phone]

## 2019-03-26 NOTE — ED Notes (Signed)
Patient is being discharged from the Urgent [facility] and sent to the Emergency Department via personal vehicle by family member. Per Dr Meda Coffee, patient is stable but in need of higher level of care due to chest pain & abnormal EKG. Patient is aware and verbalizes understanding of plan of care.   Vitals:   03/26/19 1107  BP: (!) 153/93  Pulse: 79  Resp: 16  Temp: (!) 97.5 F (36.4 C)  SpO2: 99%

## 2019-03-26 NOTE — ED Provider Notes (Signed)
[facility] EMERGENCY DEPARTMENT Provider Note   CSN: [id number] Arrival date & time: 03/26/19  1132     History Chief Complaint  Patient presents with  . Chest Pain    Nathan Young is a 68 y.o. male.  HPI  HPI: A 68 year old patient with a history of hypertension and hypercholesterolemia presents for evaluation of chest pain. Initial onset of pain was less than one hour ago. The patient's chest pain is described as heaviness/pressure/tightness and is not worse with exertion. The patient's chest pain is middle- or left-sided, is not well-localized, is not sharp and does not radiate to the arms/jaw/neck. The patient does not complain of nausea and denies diaphoresis. The patient has no history of stroke, has no history of peripheral artery disease, has not smoked in the past 90 days, denies any history of treated diabetes, has no relevant family history of coronary artery disease (first degree relative at less than age 55) and does not have an elevated BMI (>=30).  Patient states he has been having symptoms the last couple of days.  He has been under a lot of stress recently.  He is mother passed away recently and his father was in the hospital.  He was having some slight discomfort in his chest so he took his blood pressure the other day and was very elevated.  His symptoms persisted today.  He went to an urgent care and they recommended he come to the ED for evaluation.  Does not have a history of heart disease.  He does not smoke.  Patient states that symptoms were mild Past Medical History:  Diagnosis Date  . ANXIETY 11/07/2006  . HYPERLIPIDEMIA 07/31/2007  . HYPERTENSION 11/07/2006  . OSTEOARTHRITIS 11/07/2006  . Sleep apnea    hasn't used cpap for "10-12 years"    Patient Active Problem List   Diagnosis Date Noted  . History of adenomatous polyp of colon 04/13/2016  . Actinic keratosis 08/08/2015  . Plantar fasciitis of left foot 05/16/2011  . Hyperlipidemia  07/31/2007  . Insomnia 11/07/2006  . Essential hypertension 11/07/2006  . Osteoarthritis of right knee 11/07/2006    Past Surgical History:  Procedure Laterality Date  . CATARACT EXTRACTION     bilateral  . HERNIA REPAIR     2001 Dr Lennie Hummer left groin; right 2012  . KNEE ARTHROSCOPY Right 2008       Family History  Problem Relation Age of Onset  . Heart disease Mother        pacer; MI late 68s or early 36s  . Hyperlipidemia Mother   . Hypertension Mother   . Colon cancer Neg Hx   . Esophageal cancer Neg Hx   . Stomach cancer Neg Hx   . Rectal cancer Neg Hx     Social History   Tobacco Use  . Smoking status: Never Smoker  . Smokeless tobacco: Never Used  Substance Use Topics  . Alcohol use: No  . Drug use: No    Home Medications Prior to Admission medications   Medication Sig Start Date End Date Taking? Authorizing Provider  atenolol (TENORMIN) 50 MG tablet Take 1 tablet (50 mg total) by mouth daily. 01/24/18   Marin Olp, MD  atorvastatin (LIPITOR) 20 MG tablet TAKE 1 TABLET BY MOUTH EVERY DAY 02/10/19   Marin Olp, MD  diazepam (VALIUM) 5 MG tablet TAKE 1/2 TO 1 TABLET BY MOUTH EVERY DAY AS NEEDED FOR SLEEP 09/11/18   Marin Olp,  MD  diclofenac sodium (VOLTAREN) 1 % GEL Apply 4 g topically 4 (four) times daily. 01/27/19   Marin Olp, MD  QUEtiapine (SEROQUEL) 25 MG tablet TAKE 1/2 TABLET BY MOUTH AT BEDTIME AS NEEDED FOR SLEEP 02/10/19   Marin Olp, MD    Allergies    Patient has no known allergies.  Review of Systems   Review of Systems  All other systems reviewed and are negative.   Physical Exam Updated Vital Signs BP 102/82   Pulse 65   Temp 97.9 F (36.6 C) (Oral)   Resp 20   Ht 1.778 m (5\' 10" )   Wt 81.6 kg   SpO2 97%   BMI 25.83 kg/m   Physical Exam Vitals and nursing note reviewed.  Constitutional:      General: He is not in acute distress.    Appearance: He is well-developed.  HENT:     Head:  Normocephalic and atraumatic.     Right Ear: External ear normal.     Left Ear: External ear normal.  Eyes:     General: No scleral icterus.       Right eye: No discharge.        Left eye: No discharge.     Conjunctiva/sclera: Conjunctivae normal.  Neck:     Trachea: No tracheal deviation.  Cardiovascular:     Rate and Rhythm: Normal rate and regular rhythm.  Pulmonary:     Effort: Pulmonary effort is normal. No respiratory distress.     Breath sounds: Normal breath sounds. No stridor. No wheezing or rales.  Abdominal:     General: Bowel sounds are normal. There is no distension.     Palpations: Abdomen is soft.     Tenderness: There is no abdominal tenderness. There is no guarding or rebound.  Musculoskeletal:        General: No tenderness.     Cervical back: Neck supple.  Skin:    General: Skin is warm and dry.     Findings: No rash.  Neurological:     Mental Status: He is alert.     Cranial Nerves: No cranial nerve deficit (no facial droop, extraocular movements intact, no slurred speech).     Sensory: No sensory deficit.     Motor: No abnormal muscle tone or seizure activity.     Coordination: Coordination normal.     ED Results / Procedures / Treatments   Labs (all labs ordered are listed, but only abnormal results are displayed) Labs Reviewed  BASIC METABOLIC PANEL - Abnormal; Notable for the following components:      Result Value   Glucose, Bld 161 (*)    Creatinine, Ser 1.40 (*)    GFR calc non Af Amer 51 (*)    GFR calc Af Amer 59 (*)    All other components within normal limits  CBC - Abnormal; Notable for the following components:   MCH 34.1 (*)    All other components within normal limits  TROPONIN I (HIGH SENSITIVITY)  TROPONIN I (HIGH SENSITIVITY)    EKG None  Radiology DG Chest 2 View  Result Date: 03/26/2019 CLINICAL DATA:  Pt states his blood pressure has been high x 1 day. Some chest pain. Denies sob. No previous heart or lung diseases. No  surgeries to heart or lungs. Nonsmoker. EXAM: CHEST - 2 VIEW COMPARISON:  Chest radiograph 05/08/2010 FINDINGS: Mild enlargement of the aortic knob compared to prior radiograph. Heart size is normal. The lungs are  clear. No pneumothorax or pleural effusion. No acute finding in the visualized skeleton. IMPRESSION: Mild enlargement of the aortic arch compared to prior 2012 radiograph, which is nonspecific. If there is concern for aneurysm or acute aortic pathology, consider cross-sectional imaging. Electronically Signed   By: Audie Pinto M.D.   On: 03/26/2019 12:40    Procedures Procedures (including critical care time)  Medications Ordered in ED Medications  sodium chloride flush (NS) 0.9 % injection 3 mL (3 mLs Intravenous Not Given 03/26/19 1350)    ED Course  I have reviewed the triage vital signs and the nursing notes.  Pertinent labs & imaging results that were available during my care of the patient were reviewed by me and considered in my medical decision making (see chart for details).  Clinical Course as of Mar 25 1554  Thu Mar 26, 2019  1418 Initial labs reviewed.  CBC unremarkable.  Troponin is normal.   [JK]  1419 Chest x-ray shows some mild enlargement of the aortic arch.  Will ct to clarify   [JK]    Clinical Course User Index [JK] Dorie Rank, MD   MDM Rules/Calculators/A&P HEAR Score: 5                    Patient with moderate risk hear score.  Patient symptoms have been relatively mild.  Serial troponins are negative.  Low suspicion for acute coronary syndrome at this time.  Chest x-ray however does show some aortic irregularity.  Patient did have complaints of hypertension earlier in with his complaints I think is reasonable to rule out any aortic issue.  CT scan is pending.  If that is negative I think the patient will be stable for discharge and outpatient follow-up.   Final Clinical Impression(s) / ED Diagnoses Final diagnoses:  Chest pain, unspecified  type    Rx / DC Orders ED Discharge Orders    None       Dorie Rank, MD 03/26/19 1555

## 2019-03-26 NOTE — Telephone Encounter (Signed)
Calls with high blood pressure readings this morning. 143/95, 147/100/, HR 81 bpm, 206/171. Instructed to take morning dose of atenolol. On recheck, B/P 136/95 HR 106. Pulse jump of 25 bpm over a short period now.Stated he has had mild chest tightness over the last 2 days including this morning.Dealing with enormous stress of a parent passing recently and having the father to deal with.  No SOB/Visual changes/dizziness/HA. No fever. Advised ED or UC at this time. Wife will drive him now. Routing to PCP.  Reason for Disposition . [id number] Systolic BP  >= 0000000 OR Diastolic >= [id number] AND [id number] cardiac or neurologic symptoms (e.g., chest pain, difficulty breathing, unsteady gait, blurred vision)  Answer Assessment - Initial Assessment Questions 1. BLOOD PRESSURE: "What is the blood pressure?" "Did you take at least two measurements 5 minutes apart?"     143/95 8:45a   147/100 at 9:00a. HR 82,81 2. ONSET: "When did you take your blood pressure?"     now 3. HOW: "How did you obtain the blood pressure?" (e.g., visiting nurse, automatic home BP monitor)     Home cuff 4. HISTORY: "Do you have a history of high blood pressure?"     yes 5. MEDICATIONS: "Are you taking any medications for blood pressure?" "Have you missed any doses recently?"     Have not missed any doses of atenolol. Instructed to take now. 6. OTHER SYMPTOMS: "Do you have any symptoms?" (e.g., headache, chest pain, blurred vision, difficulty breathing, weakness)     Chest tightness 7. PREGNANCY: "Is there any chance you are pregnant?" "When was your last menstrual period?"     na  Protocols used: HIGH BLOOD PRESSURE-A-AH

## 2019-03-26 NOTE — ED Triage Notes (Signed)
Pt presents with intermittent central chest pain since yesterday and elevated blood pressure since this morning.  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

## 2019-03-26 NOTE — ED Notes (Signed)
Wife, [place], [phone]. Would like pt updates.

## 2019-03-26 NOTE — Telephone Encounter (Signed)
FYI

## 2019-03-26 NOTE — Discharge Instructions (Addendum)
Go to ER

## 2019-03-26 NOTE — ED Provider Notes (Signed)
Patient was seen in triage.  Vital signs are reviewed.  Blood pressure mildly elevated.  Patient describes some SUBsternal chest pressure present since yesterday.  It comes and goes.  Associated with some feeling of anxiety.  No pain with deep breath.  No pain with exertion.  No history of coronary artery disease.  Patient did call his primary care doctor who told him to go to the ER or the urgent care center. I reviewed his EKG.  He has a new right bundle blanch block, and fusion irregular beats.  Compared with normal EKG in 2012.  I cannot tell if he has ischemic changes given the conduction delay. I recommend to go to the emergency room for further evaluation   Raylene Everts, MD 03/26/19 1325

## 2019-03-26 NOTE — Discharge Instructions (Addendum)
You were evaluated in the Emergency Department and after careful evaluation, we did not find any emergent condition requiring admission or further testing in the hospital.  Your exam/testing today was overall reassuring.  CT scan did not show any abnormalities.  Please return to the Emergency Department if you experience any worsening of your condition.  We encourage you to follow up with a primary care provider.  Thank you for allowing [place] to be a part of your care.

## 2019-03-26 NOTE — ED Notes (Signed)
Discharge instructions discussed with pt. Pt verbalized understanding. Pt stable and ambulatory. No signature pad available. 

## 2019-03-26 NOTE — Telephone Encounter (Signed)
See note

## 2019-03-30 NOTE — Telephone Encounter (Signed)
ER disposition appropriate with chest pain

## 2019-04-02 ENCOUNTER — Other Ambulatory Visit: Payer: Self-pay

## 2019-04-02 ENCOUNTER — Telehealth: Payer: Self-pay | Admitting: Family Medicine

## 2019-04-02 NOTE — Telephone Encounter (Signed)
Patient called in saying he spoke to a nurse last week and explained how his BP was going pretty high and they recommended that he go to urgent care, which then went to the ED. He said he wants to know if he needs an appointment or what to do because his BP is still going high.

## 2019-04-02 NOTE — Telephone Encounter (Signed)
Called patient he has still been having bp readings of 150/100. No changes in symptoms. I have made app for tomorrow to come in office. Reviewed red words for him to go to ED

## 2019-04-02 NOTE — Telephone Encounter (Signed)
Agree with follow-up in office needed

## 2019-04-03 ENCOUNTER — Ambulatory Visit (INDEPENDENT_AMBULATORY_CARE_PROVIDER_SITE_OTHER): Payer: Medicare Other | Admitting: Family Medicine

## 2019-04-03 ENCOUNTER — Encounter: Payer: Self-pay | Admitting: Family Medicine

## 2019-04-03 VITALS — BP 156/90 | HR 84 | Temp 98.0°F | Ht 70.0 in | Wt 187.4 lb

## 2019-04-03 DIAGNOSIS — I10 Essential (primary) hypertension: Secondary | ICD-10-CM

## 2019-04-03 DIAGNOSIS — N183 Chronic kidney disease, stage 3 unspecified: Secondary | ICD-10-CM

## 2019-04-03 DIAGNOSIS — F419 Anxiety disorder, unspecified: Secondary | ICD-10-CM | POA: Diagnosis not present

## 2019-04-03 MED ORDER — BUSPIRONE HCL 7.5 MG PO TABS: 7.5000 mg | ORAL_TABLET | Freq: Two times a day (BID) | ORAL | 1 refills | Status: DC | PRN

## 2019-04-03 MED ORDER — AMLODIPINE BESYLATE 2.5 MG PO TABS: 2.5000 mg | ORAL_TABLET | Freq: Every day | ORAL | 3 refills | Status: DC

## 2019-04-03 NOTE — Assessment & Plan Note (Addendum)
I strongly suspect chest pain is related to anxiety coupled with grief with recent loss of mother and near death of father.  We are going to try buspirone sparingly for anxiety to see if that helps with anxiety and also with chest pain.  If he has worsening symptoms should seek care.  We discussed if not improving consider cardiology follow-up  Trial buspirone 2021.  Want to avoid benzodiazepines and daytime-already on diazepam for sleep.  Could consider SSRI

## 2019-04-03 NOTE — Progress Notes (Signed)
Phone [phone] In person visit   Subjective:   Nathan Young is a 69 y.o. year old very pleasant male patient who presents for/with See problem oriented charting Chief Complaint  Patient presents with  . Hypertension    elevated  . Blood Sugar Problem    elevated  . Anxiety    ROS-chest pain without shortness of breath, left arm or neck pain reported.  No abnormal diaphoresis reported.  No fever or chills.  This visit occurred during the SARS-CoV-2 public health emergency.  Safety protocols were in place, including screening questions prior to the visit, additional usage of staff PPE, and extensive cleaning of exam room while observing appropriate contact time as indicated for disinfecting solutions.   Past Medical History-  Patient Active Problem List   Diagnosis Date Noted  . Chronic kidney disease (CKD), stage III (moderate) 04/03/2019    Priority: Medium  . Hyperlipidemia 07/31/2007    Priority: Medium  . Insomnia 11/07/2006    Priority: Medium  . Essential hypertension 11/07/2006    Priority: Medium  . History of adenomatous polyp of colon 04/13/2016    Priority: Low  . Actinic keratosis 08/08/2015    Priority: Low  . Plantar fasciitis of left foot 05/16/2011    Priority: Low  . Osteoarthritis of right knee 11/07/2006    Priority: Low  . Anxiety 04/03/2019    Medications- reviewed and updated Current Outpatient Medications  Medication Sig Dispense Refill  . atenolol (TENORMIN) 50 MG tablet Take 1 tablet (50 mg total) by mouth daily. 90 tablet 3  . atorvastatin (LIPITOR) 20 MG tablet TAKE 1 TABLET BY MOUTH EVERY DAY 90 tablet 3  . atorvastatin (LIPITOR) 20 MG tablet     . diazepam (VALIUM) 5 MG tablet TAKE 1/2 TO 1 TABLET BY MOUTH EVERY DAY AS NEEDED FOR SLEEP 30 tablet 5  . diclofenac sodium (VOLTAREN) 1 % GEL Apply 4 g topically 4 (four) times daily. 150 g 3  . QUEtiapine (SEROQUEL) 25 MG tablet TAKE 1/2 TABLET BY MOUTH AT BEDTIME AS NEEDED FOR SLEEP 45  tablet 3  . amLODipine (NORVASC) 2.5 MG tablet Take 1 tablet (2.5 mg total) by mouth daily. 90 tablet 3  . busPIRone (BUSPAR) 7.5 MG tablet Take 1 tablet (7.5 mg total) by mouth 2 (two) times daily as needed. 30 tablet 1   No current facility-administered medications for this visit.     Objective:  BP (!) 156/90 (BP Location: Left Arm, Patient Position: Sitting)   Pulse 84   Temp 98 F (36.7 C)   Ht 5\' 10"  (1.778 m)   Wt 187 lb 6.4 oz (85 kg)   SpO2 97%   BMI 26.89 kg/m  Gen: NAD, resting comfortably CV: RRR no murmurs rubs or gallops Lungs: CTAB no crackles, wheeze, rhonchi No chest wall pain noted Ext: no edema Skin: warm, dry     Assessment and Plan  # Hypertension /chest pain follow up/anxiety coupled with grief S:He states has had a rought 2 months- dad got sick 01/29/2019 with "double pneumonia" and was only given 50% chance of living at age 8. Mom died 3 weeks ago yesterday - not sure of cause. Stress from all this could contribute as dad is now by himself which is hard for patient to see. Blood pressure is running high at home.   Patient is currently taking Atenolol-chlorthalidone 50-25 mg for hypertension. He stated taking them nightly, but is now taking them in the morning.   I  reviewed emergency department notes from 03/26/2019-patient reported with chest pain.  CBC unremarkable.  Serial troponins negative.  Chest x-ray with mild enlargement of aortic arch.  CT angiogram was completed which showed no evidence of aortic dissection or aneurysm.  BMP showed stability of CKD stage III with GFR at 1.4 stable from two months ago.  Since ER, He feels like he has had some intermittent chest pain that comes and goes since leaving the ER- feels like higher with anxiety or higher blood pressure. No worse than it was at time of evaluation. 2/10 at its worse- gets better with calming his mind.  A/P:  Blood pressure is poorly controlled with atenolol alone.  Add amlodipine 2.5 mg in  the evening and follow-up in 2 weeks-he will continue to monitor blood pressure at home  I strongly suspect chest pain is related to anxiety coupled with grief with recent loss of mother and near death of father.  We are going to try buspirone sparingly for anxiety to see if that helps with anxiety and also with chest pain.  If he has worsening symptoms should seek care.  We discussed if not improving consider cardiology follow-up  #Hyperglycemia-we discussed blood sugar of 161 while in the hospital was nonfasting and was in high stress situation.  He has checked his blood sugar at home in the past fasting and it has been in the 90s-I do not think this suggest increased risk of diabetes.  Reassurance provided   Chronic kidney disease (CKD), stage III (moderate) S: Had worsening kidney function on Voltaren-improved off of this-had MRI done which showed cyst.  Considered nephrology referral but canceled due to improvement when coming off Voltaren A/P: GFR has remained in the 50s.  We discussed new diagnosis of CKD stage III-needs to avoid NSAIDs  Anxiety I strongly suspect chest pain is related to anxiety coupled with grief with recent loss of mother and near death of father.  We are going to try buspirone sparingly for anxiety to see if that helps with anxiety and also with chest pain.  If he has worsening symptoms should seek care.  We discussed if not improving consider cardiology follow-up  Trial buspirone 2021.  Want to avoid benzodiazepines and daytime-already on diazepam for sleep.  Could consider SSRI  Recommended follow up: 2-week follow-up for blood pressure suggested Future Appointments  Date Time Provider [place]  07/29/2019  9:40 AM Yong Channel Brayton Mars, MD [facility]   Lab/Order associations:   ICD-10-CM   1. Essential hypertension  I10   2. Stage 3 chronic kidney disease, unspecified whether stage 3a or 3b CKD  N18.30   3. Anxiety  F41.9     Meds ordered this encounter   Medications  . amLODipine (NORVASC) 2.5 MG tablet    Sig: Take 1 tablet (2.5 mg total) by mouth daily.    Dispense:  90 tablet    Refill:  3  . busPIRone (BUSPAR) 7.5 MG tablet    Sig: Take 1 tablet (7.5 mg total) by mouth 2 (two) times daily as needed.    Dispense:  30 tablet    Refill:  1    Return precautions advised.  Garret Reddish, MD

## 2019-04-03 NOTE — Assessment & Plan Note (Signed)
S: Had worsening kidney function on Voltaren-improved off of this-had MRI done which showed cyst.  Considered nephrology referral but canceled due to improvement when coming off Voltaren A/P: GFR has remained in the 50s.  We discussed new diagnosis of CKD stage III-needs to avoid NSAIDs

## 2019-04-03 NOTE — Patient Instructions (Addendum)
Blood pressure is higher than we would like- start amlodipine 2.5 mg tonight. Skip atenolol tonight and tomorrow morning then take tomorrow evening with amlodipine. Follow up with me in 2 weeks. If chest pressure does not improve with better blood pressure and more time out form loss of mom- may get cardiology opinion . If you have new or worsening symptoms in regards to chest pain or new symptoms like shortness of breath- seek care immediately.   Blood sugar was high at hospital but you were not fasting and you were in a stressful situation- both of these raise the sugar. Your sugar was not in diabetes range and I am reassured by how low your sugars have been at home when you have pricked your finger fasting (in the 90s)   Recommended follow [id number] in about 2 weeks (around 04/17/2019) for follow up- or sooner if needed.

## 2019-04-09 ENCOUNTER — Telehealth: Payer: Self-pay | Admitting: Family Medicine

## 2019-04-09 NOTE — Telephone Encounter (Signed)
Appears to be improving- to try to get closer to goal- lets increase to 5 mg of amlodipine daily - #30 with 5 refills. He can take 2 of the 2.5mg  pills of amlodipine until he runs out

## 2019-04-09 NOTE — Telephone Encounter (Signed)
Called no answer no vm. 

## 2019-04-09 NOTE — Telephone Encounter (Signed)
Pt called stating that Dr. Yong Channel put him on a new BP medication. Pt does not think the medication is working to lower his BP. Please advise.

## 2019-04-09 NOTE — Telephone Encounter (Signed)
Patient has been getting readings about 145/90. Patient denies any headaches SOB or chest pains.  He does feel anxious and nervous. He started the medication about a week ago. He takes medication about 9 pm he has not missed any doses. Wanted to know if he needs to go up on strength. He has tried to take a vallum but it did not help with the anxiety.

## 2019-04-10 ENCOUNTER — Other Ambulatory Visit: Payer: Self-pay

## 2019-04-10 MED ORDER — AMLODIPINE BESYLATE 5 MG PO TABS: 5.0000 mg | ORAL_TABLET | Freq: Every day | ORAL | 5 refills | Status: DC

## 2019-04-10 NOTE — Telephone Encounter (Signed)
Called patient reviewed instructions and had repeat back to me. I have called in new script will pick up once he uses supply. He has a follow up in about a week will bring log of readings with him to that appointment.

## 2019-04-14 ENCOUNTER — Other Ambulatory Visit: Payer: Self-pay | Admitting: Family Medicine

## 2019-04-20 ENCOUNTER — Ambulatory Visit (INDEPENDENT_AMBULATORY_CARE_PROVIDER_SITE_OTHER): Payer: Medicare Other | Admitting: Family Medicine

## 2019-04-20 ENCOUNTER — Encounter: Payer: Self-pay | Admitting: Family Medicine

## 2019-04-20 ENCOUNTER — Other Ambulatory Visit: Payer: Self-pay

## 2019-04-20 VITALS — BP 120/82 | HR 60 | Temp 98.7°F | Ht 70.0 in | Wt 189.6 lb

## 2019-04-20 DIAGNOSIS — F419 Anxiety disorder, unspecified: Secondary | ICD-10-CM

## 2019-04-20 DIAGNOSIS — R0789 Other chest pain: Secondary | ICD-10-CM | POA: Diagnosis not present

## 2019-04-20 DIAGNOSIS — I10 Essential (primary) hypertension: Secondary | ICD-10-CM

## 2019-04-20 NOTE — Assessment & Plan Note (Signed)
S: compliant with Amlodipine 5mg  (restarted at 2.5 mg but increase to 5 mg after he reached out to [place]) and atenolol 50mg , pt states his BP is still staying elevated a little bit. Home cuff verified by home health nurse in the past.  Home average proximately 131/91 over at least 25 readings BP Readings from Last 3 Encounters:  04/20/19 120/82  04/03/19 (!) 156/90  03/26/19 (!) 125/98  A/P: Well-controlled in office but poor control home #s diastolic with average in the 90s- Continue atenolol 50mg  in the evening Increase amlodipine to 7.5 mg- can either use 5 mg plus 2.5 mg or take 1.5 of the 5 mg tablets.  Encouraged him to watch for edema.

## 2019-04-20 NOTE — Progress Notes (Signed)
Phone [phone] In person visit   Subjective:   Nathan Young is a 69 y.o. year old very pleasant male patient who presents for/with See problem oriented charting Chief Complaint  Patient presents with  . Follow-up  . Hypertension   This visit occurred during the SARS-CoV-2 public health emergency.  Safety protocols were in place, including screening questions prior to the visit, additional usage of staff PPE, and extensive cleaning of exam room while observing appropriate contact time as indicated for disinfecting solutions.   Past Medical History-  Patient Active Problem List   Diagnosis Date Noted  . Chronic kidney disease (CKD), stage III (moderate) 04/03/2019    Priority: Medium  . Hyperlipidemia 07/31/2007    Priority: Medium  . Insomnia 11/07/2006    Priority: Medium  . Essential hypertension 11/07/2006    Priority: Medium  . History of adenomatous polyp of colon 04/13/2016    Priority: Low  . Actinic keratosis 08/08/2015    Priority: Low  . Plantar fasciitis of left foot 05/16/2011    Priority: Low  . Osteoarthritis of right knee 11/07/2006    Priority: Low  . Anxiety 04/03/2019    Medications- reviewed and updated Current Outpatient Medications  Medication Sig Dispense Refill  . amLODipine (NORVASC) 5 MG tablet Take 1 tablet (5 mg total) by mouth daily. 30 tablet 5  . atenolol (TENORMIN) 50 MG tablet TAKE 1 TABLET BY MOUTH EVERY DAY 90 tablet 3  . atorvastatin (LIPITOR) 20 MG tablet TAKE 1 TABLET BY MOUTH EVERY DAY 90 tablet 3  . diazepam (VALIUM) 5 MG tablet TAKE 1/2 TO 1 TABLET BY MOUTH EVERY DAY AS NEEDED FOR SLEEP 30 tablet 5  . diclofenac sodium (VOLTAREN) 1 % GEL Apply 4 g topically 4 (four) times daily. 150 g 3  . QUEtiapine (SEROQUEL) 25 MG tablet TAKE 1/2 TABLET BY MOUTH AT BEDTIME AS NEEDED FOR SLEEP 45 tablet 3   No current facility-administered medications for this visit.     Objective:  BP 120/82   Pulse 60   Temp 98.7 F (37.1 C)   Ht  5\' 10"  (1.778 m)   Wt 189 lb 9.6 oz (86 kg)   SpO2 98%   BMI 27.20 kg/m  Gen: NAD, resting comfortably CV: RRR no murmurs rubs or gallops No chest wall pain noted Lungs: CTAB no crackles, wheeze, rhonchi Ext: no edema despite being on amlodipine at present    Assessment and Plan   #hypertension S: compliant with Amlodipine 5mg  (restarted at 2.5 mg but increase to 5 mg after he reached out to [place]) and atenolol 50mg , pt states his BP is still staying elevated a little bit. Home cuff verified by home health nurse in the past.  Home average proximately 131/91 over at least 25 readings BP Readings from Last 3 Encounters:  04/20/19 120/82  04/03/19 (!) 156/90  03/26/19 (!) 125/98  A/P: Well-controlled in office but poor control home #s diastolic with average in the 90s- Continue atenolol 50mg  in the evening Increase amlodipine to 7.5 mg- can either use 5 mg plus 2.5 mg or take 1.5 of the 5 mg tablets.  Encouraged him to watch for edema.   #Anxiety S: Unfortunately patient actually felt more stressed on buspirone.  We were hopeful with more time out from recent stressors/grieving that chest pain pattern will improve but it has not-see below.  Patient still struggling with loss of his mother and trying to care for his elderly father A/P: Anxiety seems to  be improving with time out from recent stressors.  We will touch base at follow-up and consider counseling discussed with buspirone not effective-would prefer to avoid benzodiazepine unless absolutely needed.  Could consider grief counseling with hospice or [facility].  # Atypical chest pain S:continues to feel some chest pain when stress levels are up. Rates 1/10 in lower portion of sternum- some pain in left shoulder but doesn't correspond to chest pain and has history of shoulder issues. No shortness of breath, diaphroesis, left arm or neck pain, and no nausea . Worse with anxiety- sometimes notes BP up.  No exertional chest  pain  Found out mom had cardiac issues from her death certificate A/P: Patient already had acute work-up in the emergency room in early [place] that note in my note from last visit.  I think probability of chest pain being cardiac in nature is low.  Discussed possible stress testing versus cardiology referral to be on the safe side given recurrent issues -patient has discussed with his family and they would really like to go ahead and move forward with cardiology referral and this was placed today.  Recommended follow up: Follow-up in 3 to 4 weeks advised Future Appointments  Date Time Provider [place]  05/12/2019  3:00 PM Marin Olp, MD [facility]  07/29/2019  9:40 AM Yong Channel Brayton Mars, MD [facility]    Lab/Order associations:   ICD-10-CM   1. Essential hypertension  I10   2. Atypical chest pain  R07.89 Ambulatory referral to Cardiology  3. Anxiety  F41.9    Return precautions advised.  Garret Reddish, MD

## 2019-04-20 NOTE — Patient Instructions (Addendum)
Continue atenolol 50mg  in the evening Increase amlodipine to 7.5 mg- can either use 5 mg plus 2.5 mg or take 1.5 of the 5 mg tablets.   Goal is for bottom # to be below 90 on average.   Follow up 3-4 weeks  We will call you within two weeks about your referral to cardiology- make sure to take it easy to visit and if you have new or worsening issues to follow up with [place]. If you do not hear within 3 weeks, give [place] a call.

## 2019-04-23 ENCOUNTER — Other Ambulatory Visit: Payer: Self-pay | Admitting: Family Medicine

## 2019-04-23 DIAGNOSIS — Z7189 Other specified counseling: Secondary | ICD-10-CM | POA: Insufficient documentation

## 2019-04-23 DIAGNOSIS — I4510 Unspecified right bundle-branch block: Secondary | ICD-10-CM | POA: Insufficient documentation

## 2019-04-23 NOTE — Telephone Encounter (Signed)
Last OV 1/25/214 Last refill 09/11/18 #30/5 Next OV 05/12/19

## 2019-04-23 NOTE — Progress Notes (Deleted)
Cardiology Office Note   Date:  04/23/2019   ID:  Nathan Young, DOB [birth year], MRN [id number]  PCP:  Marin Olp, MD  Cardiologist:   No primary care provider on file. Referring:  ***  No chief complaint on file.     History of Present Illness: Nathan Young is a 69 y.o. male who presents for evaluation of an abnormal EKG.  ***     Past Medical History:  Diagnosis Date  . ANXIETY 11/07/2006  . HYPERLIPIDEMIA 07/31/2007  . HYPERTENSION 11/07/2006  . OSTEOARTHRITIS 11/07/2006  . Sleep apnea    hasn't used cpap for "10-12 years"    Past Surgical History:  Procedure Laterality Date  . CATARACT EXTRACTION     bilateral  . HERNIA REPAIR     2001 Dr Lennie Hummer left groin; right 2012  . KNEE ARTHROSCOPY Right 2008     Current Outpatient Medications  Medication Sig Dispense Refill  . amLODipine (NORVASC) 5 MG tablet Take 1 tablet (5 mg total) by mouth daily. 30 tablet 5  . atenolol (TENORMIN) 50 MG tablet TAKE 1 TABLET BY MOUTH EVERY DAY 90 tablet 3  . atorvastatin (LIPITOR) 20 MG tablet TAKE 1 TABLET BY MOUTH EVERY DAY 90 tablet 3  . diazepam (VALIUM) 5 MG tablet TAKE 1/2 TO 1 TABLET BY MOUTH EVERY DAY AS NEEDED FOR SLEEP 30 tablet 5  . diclofenac sodium (VOLTAREN) 1 % GEL Apply 4 g topically 4 (four) times daily. 150 g 3  . QUEtiapine (SEROQUEL) 25 MG tablet TAKE 1/2 TABLET BY MOUTH AT BEDTIME AS NEEDED FOR SLEEP 45 tablet 3   No current facility-administered medications for this visit.    Allergies:   Patient has no known allergies.    Social History:  The patient  reports that he has never smoked. He has never used smokeless tobacco. He reports that he does not drink alcohol or use drugs.   Family History:  The patient's ***family history includes Heart disease in his mother; Hyperlipidemia in his mother; Hypertension in his mother.    ROS:  Please see the history of present illness.   Otherwise, review of systems are positive for {NONE  DEFAULTED:18576::"none"}.   All other systems are reviewed and negative.    PHYSICAL EXAM: VS:  There were no vitals taken for this visit. , BMI There is no height or weight on file to calculate BMI. GENERAL:  Well appearing HEENT:  Pupils equal round and reactive, fundi not visualized, oral mucosa unremarkable NECK:  No jugular venous distention, waveform within normal limits, carotid upstroke brisk and symmetric, no bruits, no thyromegaly LYMPHATICS:  No cervical, inguinal adenopathy LUNGS:  Clear to auscultation bilaterally BACK:  No CVA tenderness CHEST:  Unremarkable HEART:  PMI not displaced or sustained,S1 and S2 within normal limits, no S3, no S4, no clicks, no rubs, *** murmurs ABD:  Flat, positive bowel sounds normal in frequency in pitch, no bruits, no rebound, no guarding, no midline pulsatile mass, no hepatomegaly, no splenomegaly EXT:  2 plus pulses throughout, no edema, no cyanosis no clubbing SKIN:  No rashes no nodules NEURO:  Cranial nerves II through XII grossly intact, motor grossly intact throughout PSYCH:  Cognitively intact, oriented to person place and time    EKG:  EKG {ACTION; IS/IS [id number] ordered today. The ekg ordered today demonstrates ***   Recent Labs: 01/27/2019: ALT 21 03/26/2019: BUN 17; Creatinine, Ser 1.40; Hemoglobin 15.7; Platelets 294; Potassium 4.5; Sodium 136  Lipid Panel    Component Value Date/Time   CHOL 127 01/27/2019 0952   TRIG 127.0 01/27/2019 0952   TRIG 84 02/27/2006 0828   HDL 32.00 (L) 01/27/2019 0952   CHOLHDL 4 01/27/2019 0952   VLDL 25.4 01/27/2019 0952   LDLCALC 70 01/27/2019 0952   LDLDIRECT 79.0 09/11/2018 1329      Wt Readings from Last 3 Encounters:  04/20/19 189 lb 9.6 oz (86 kg)  04/03/19 187 lb 6.4 oz (85 kg)  03/26/19 180 lb (81.6 kg)      Other studies Reviewed: Additional studies/ records that were reviewed today include: ***. Review of the above records demonstrates:  Please see elsewhere  in the note.  ***   ASSESSMENT AND PLAN:  ABNORMAL EKG:  ***  COVID EDUCATION:  ***   Current medicines are reviewed at length with the patient today.  The patient {ACTIONS; HAS/DOES NOT HAVE:19233} concerns regarding medicines.  The following changes have been made:  {PLAN; NO CHANGE:13088:s}  Labs/ tests ordered today include: *** No orders of the defined types were placed in this encounter.    Disposition:   FU with ***    Signed, Minus Breeding, MD  04/23/2019 4:07 PM    [facility]

## 2019-04-24 ENCOUNTER — Ambulatory Visit: Payer: Medicare Other | Admitting: Cardiology

## 2019-04-24 ENCOUNTER — Telehealth: Payer: Self-pay | Admitting: Cardiology

## 2019-04-24 ENCOUNTER — Ambulatory Visit: Payer: Medicare Other | Attending: Internal Medicine

## 2019-04-24 DIAGNOSIS — Z20822 Contact with and (suspected) exposure to covid-19: Secondary | ICD-10-CM

## 2019-04-24 NOTE — Telephone Encounter (Signed)
Spoke with pt who states he may have been exposed to COVID-19 a week ago. Informed pt that options are to self isolate for 2 weeks and monitor for symptoms or get COVID-19 test to see if pt is positive. Informed pt that if he self isolates for 2 weeks and reschedules appt, the next available appt is 06/02/2019. Pt would like to get COVID-19 test appt set up and then reschedule for appt after results should be back. Attempted to assist pt with COVID-19 appt set up on [facility] website but triage nurse continued to receive error message. Provided pt with number on website.  [place], [place] [id number]  Pt will call to schedule testing and call [facility] back to reschedule OV with Dr. Percival Spanish

## 2019-04-24 NOTE — Telephone Encounter (Signed)
Patient is calling stating that he would like to make Dr. Percival Spanish aware that he was in [place], where there has been an outbreak of Covid-19, 1 week ago. Patient states that he had not experienced any symptoms. Please call and advise as to whether patient should keep appointment scheduled for today, 04/24/19 at 3:20 PM or not.

## 2019-04-25 LAB — NOVEL CORONAVIRUS, NAA: SARS-CoV-2, NAA: NOT DETECTED

## 2019-04-27 ENCOUNTER — Ambulatory Visit: Payer: Medicare Other

## 2019-04-27 ENCOUNTER — Ambulatory Visit (INDEPENDENT_AMBULATORY_CARE_PROVIDER_SITE_OTHER): Payer: Medicare Other

## 2019-04-27 ENCOUNTER — Other Ambulatory Visit: Payer: Self-pay

## 2019-04-27 DIAGNOSIS — Z0000 Encounter for general adult medical examination without abnormal findings: Secondary | ICD-10-CM | POA: Diagnosis not present

## 2019-04-27 NOTE — Patient Instructions (Signed)
Nathan Young , Thank you for taking time to come for your Medicare Wellness Visit. I appreciate your ongoing commitment to your health goals. Please review the following plan we discussed and let me know if I can assist you in the future.   Screening recommendations/referrals: Colorectal Screening: up to date;last colonoscopy 04/09/16 with Dr. Fuller Plan   Vision and Dental Exams: Recommended annual ophthalmology exams for early detection of glaucoma and other disorders of the eye Recommended annual dental exams for proper oral hygiene  Vaccinations: Influenza vaccine: completed 12/11/18 Pneumococcal vaccine: up to date; last 02/08/15 Tdap vaccine: up to date; last 08/22/18  Shingles vaccine: Shingrix completed   Advanced directives: Please bring a copy of your POA (Power of [place]) and/or Living Will to your next appointment.  Goals: Recommend to drink at least 6-8 8oz glasses of water per day and consume a balanced diet rich in fresh fruits and vegetables.   Next appointment: Please schedule your Annual Wellness Visit with your Nurse Health Advisor in one year.  Preventive Care 71 Years and Older, Male Preventive care refers to lifestyle choices and visits with your health care provider that can promote health and wellness. What does preventive care include?  A yearly physical exam. This is also called an annual well check.  Dental exams once or twice a year.  Routine eye exams. Ask your health care provider how often you should have your eyes checked.  Personal lifestyle choices, including:  Daily care of your teeth and gums.  Regular physical activity.  Eating a healthy diet.  Avoiding tobacco and drug use.  Limiting alcohol use.  Practicing safe sex.  Taking low doses of aspirin every day if recommended by your health care provider..  Taking vitamin and mineral supplements as recommended by your health care provider. What happens during an annual well check? The services  and screenings done by your health care provider during your annual well check will depend on your age, overall health, lifestyle risk factors, and family history of disease. Counseling  Your health care provider may ask you questions about your:  Alcohol use.  Tobacco use.  Drug use.  Emotional well-being.  Home and relationship well-being.  Sexual activity.  Eating habits.  History of falls.  Memory and ability to understand (cognition).  Work and work Statistician. Screening  You may have the following tests or measurements:  Height, weight, and BMI.  Blood pressure.  Lipid and cholesterol levels. These may be checked every 5 years, or more frequently if you are over 62 years old.  Skin check.  Lung cancer screening. You may have this screening every year starting at age 58 if you have a 30-pack-year history of smoking and currently smoke or have quit within the past 15 years.  Fecal occult blood test (FOBT) of the stool. You may have this test every year starting at age 10.  Flexible sigmoidoscopy or colonoscopy. You may have a sigmoidoscopy every 5 years or a colonoscopy every 10 years starting at age 93.  Prostate cancer screening. Recommendations will vary depending on your family history and other risks.  Hepatitis C blood test.  Hepatitis B blood test.  Sexually transmitted disease (STD) testing.  Diabetes screening. This is done by checking your blood sugar (glucose) after you have not eaten for a while (fasting). You may have this done every 1-3 years.  Abdominal aortic aneurysm (AAA) screening. You may need this if you are a current or former smoker.  Osteoporosis. You may  be screened starting at age 71 if you are at high risk. Talk with your health care provider about your test results, treatment options, and if necessary, the need for more tests. Vaccines  Your health care provider may recommend certain vaccines, such as:  Influenza vaccine. This  is recommended every year.  Tetanus, diphtheria, and acellular pertussis (Tdap, Td) vaccine. You may need a Td booster every 10 years.  Zoster vaccine. You may need this after age 45.  Pneumococcal 13-valent conjugate (PCV13) vaccine. One dose is recommended after age 5.  Pneumococcal polysaccharide (PPSV23) vaccine. One dose is recommended after age 73. Talk to your health care provider about which screenings and vaccines you need and how often you need them. This information is not intended to replace advice given to you by your health care provider. Make sure you discuss any questions you have with your health care provider. Document Released: 04/08/2015 Document Revised: 11/30/2015 Document Reviewed: 01/11/2015 Elsevier Interactive Patient Education  2017 [place] Prevention in the Home Falls can cause injuries. They can happen to people of all ages. There are many things you can do to make your home safe and to help prevent falls. What can I do on the outside of my home?  Regularly fix the edges of walkways and driveways and fix any cracks.  Remove anything that might make you trip as you walk through a door, such as a raised step or threshold.  Trim any bushes or trees on the path to your home.  Use bright outdoor lighting.  Clear any walking paths of anything that might make someone trip, such as rocks or tools.  Regularly check to see if handrails are loose or broken. Make sure that both sides of any steps have handrails.  Any raised decks and porches should have guardrails on the edges.  Have any leaves, snow, or ice cleared regularly.  Use sand or salt on walking paths during winter.  Clean up any spills in your garage right away. This includes oil or grease spills. What can I do in the bathroom?  Use night lights.  Install grab bars by the toilet and in the tub and shower. Do not use towel bars as grab bars.  Use non-skid mats or decals in the tub or  shower.  If you need to sit down in the shower, use a plastic, non-slip stool.  Keep the floor dry. Clean up any water that spills on the floor as soon as it happens.  Remove soap buildup in the tub or shower regularly.  Attach bath mats securely with double-sided non-slip rug tape.  Do not have throw rugs and other things on the floor that can make you trip. What can I do in the bedroom?  Use night lights.  Make sure that you have a light by your bed that is easy to reach.  Do not use any sheets or blankets that are too big for your bed. They should not hang down onto the floor.  Have a firm chair that has side arms. You can use this for support while you get dressed.  Do not have throw rugs and other things on the floor that can make you trip. What can I do in the kitchen?  Clean up any spills right away.  Avoid walking on wet floors.  Keep items that you use a lot in easy-to-reach places.  If you need to reach something above you, use a strong step stool that has  a grab bar.  Keep electrical cords out of the way.  Do not use floor polish or wax that makes floors slippery. If you must use wax, use non-skid floor wax.  Do not have throw rugs and other things on the floor that can make you trip. What can I do with my stairs?  Do not leave any items on the stairs.  Make sure that there are handrails on both sides of the stairs and use them. Fix handrails that are broken or loose. Make sure that handrails are as long as the stairways.  Check any carpeting to make sure that it is firmly attached to the stairs. Fix any carpet that is loose or worn.  Avoid having throw rugs at the top or bottom of the stairs. If you do have throw rugs, attach them to the floor with carpet tape.  Make sure that you have a light switch at the top of the stairs and the bottom of the stairs. If you do not have them, ask someone to add them for you. What else can I do to help prevent  falls?  Wear shoes that:  Do not have high heels.  Have rubber bottoms.  Are comfortable and fit you well.  Are closed at the toe. Do not wear sandals.  If you use a stepladder:  Make sure that it is fully opened. Do not climb a closed stepladder.  Make sure that both sides of the stepladder are locked into place.  Ask someone to hold it for you, if possible.  Clearly mark and make sure that you can see:  Any grab bars or handrails.  First and last steps.  Where the edge of each step is.  Use tools that help you move around (mobility aids) if they are needed. These include:  Canes.  Walkers.  Scooters.  Crutches.  Turn on the lights when you go into a dark area. Replace any light bulbs as soon as they burn out.  Set up your furniture so you have a clear path. Avoid moving your furniture around.  If any of your floors are uneven, fix them.  If there are any pets around you, be aware of where they are.  Review your medicines with your doctor. Some medicines can make you feel dizzy. This can increase your chance of falling. Ask your doctor what other things that you can do to help prevent falls. This information is not intended to replace advice given to you by your health care provider. Make sure you discuss any questions you have with your health care provider. Document Released: 01/06/2009 Document Revised: 08/18/2015 Document Reviewed: 04/16/2014 Elsevier Interactive Patient Education  2017 [employer].

## 2019-04-27 NOTE — Progress Notes (Signed)
This visit is being conducted via phone call due to the COVID-19 pandemic. This patient has given me verbal consent via phone to conduct this visit, patient states they are participating from their home address. Some vital signs may be absent or patient reported.   Patient identification: identified by name, DOB, and current address.  Location provider: [facility], Office Persons participating in the virtual visit: Denman George LPN, patient, and Dr. Garret Reddish   Subjective:   Nathan Young is a 69 y.o. male who presents for an Initial Medicare Annual Wellness Visit.  Review of Systems   Cardiac Risk Factors include: advanced age (>38men, >39 women);hypertension;male gender;dyslipidemia   Objective:    There were no vitals filed for this visit. There is no height or weight on file to calculate BMI.  Advanced Directives 04/27/2019 03/26/2019 08/22/2018 04/09/2016 03/23/2016  Does Patient Have a Medical Advance Directive? Yes No No No No  Type of Advance Directive Living will;Healthcare Power of Attorney - - - -  Does patient want to make changes to medical advance directive? No - Patient declined - - - -  Copy of [place] in Chart? No - copy requested - - - -  Would patient like information on creating a medical advance directive? - No - Patient declined No - Patient declined - -    Current Medications (verified) Outpatient Encounter Medications as of 04/27/2019  Medication Sig  . amLODipine (NORVASC) 5 MG tablet Take 1 tablet (5 mg total) by mouth daily.  Marland Kitchen atenolol (TENORMIN) 50 MG tablet TAKE 1 TABLET BY MOUTH EVERY DAY  . atorvastatin (LIPITOR) 20 MG tablet TAKE 1 TABLET BY MOUTH EVERY DAY  . diazepam (VALIUM) 5 MG tablet TAKE 1/2 TO 1 TABLET BY MOUTH EVERY DAY AS NEEDED FOR SLEEP  . diclofenac sodium (VOLTAREN) 1 % GEL Apply 4 g topically 4 (four) times daily.  . QUEtiapine (SEROQUEL) 25 MG tablet TAKE 1/2 TABLET BY MOUTH AT BEDTIME AS NEEDED FOR SLEEP     No facility-administered encounter medications on file as of 04/27/2019.    Allergies (verified) Patient has no known allergies.   History: Past Medical History:  Diagnosis Date  . ANXIETY 11/07/2006  . HYPERLIPIDEMIA 07/31/2007  . HYPERTENSION 11/07/2006  . OSTEOARTHRITIS 11/07/2006  . Sleep apnea    hasn't used cpap for "10-12 years"   Past Surgical History:  Procedure Laterality Date  . CATARACT EXTRACTION     bilateral  . HERNIA REPAIR     2001 Dr Lennie Hummer left groin; right 2012  . KNEE ARTHROSCOPY Right 2008   Family History  Problem Relation Age of Onset  . Heart disease Mother        pacer; MI late 55s or early 87s  . Hyperlipidemia Mother   . Hypertension Mother   . Colon cancer Neg Hx   . Esophageal cancer Neg Hx   . Stomach cancer Neg Hx   . Rectal cancer Neg Hx    Social History   Socioeconomic History  . Marital status: Married    Spouse name: Not on file  . Number of children: Not on file  . Years of education: Not on file  . Highest education level: Not on file  Occupational History  . Occupation: Retired   Tobacco Use  . Smoking status: Never Smoker  . Smokeless tobacco: Never Used  Substance and Sexual Activity  . Alcohol use: No  . Drug use: No  . Sexual activity:  Yes  Other Topics Concern  . Not on file  Social History Narrative   Married 1977. [place]. [place]. 2 grandkids from [place] 2005, [place] 2007.       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    Social Determinants of Health   Financial Resource Strain:   . Difficulty of Paying Living Expenses: Not on file  Food Insecurity:   . Worried About Charity fundraiser in the Last Year: Not on file  . Ran Out of Food in the Last Year: Not on file  Transportation Needs:   . Lack of Transportation (Medical): Not on file  . Lack of Transportation (Non-Medical): Not on file  Physical Activity:   . Days of Exercise per  Week: Not on file  . Minutes of Exercise per Session: Not on file  Stress:   . Feeling of Stress : Not on file  Social Connections:   . Frequency of Communication with Friends and Family: Not on file  . Frequency of Social Gatherings with Friends and Family: Not on file  . Attends Religious Services: Not on file  . Active Member of Clubs or Organizations: Not on file  . Attends Archivist Meetings: Not on file  . Marital Status: Not on file   Tobacco Counseling Counseling given: Not Answered   Clinical Intake:  Pre-visit preparation completed: Yes  Pain : No/denies pain  Diabetes: No  How often do you need to have someone help you when you read instructions, pamphlets, or other written materials from your doctor or pharmacy?: 1 - Never  Interpreter Needed?: No  Information entered by :: Denman George LPN  Activities of Daily Living In your present state of health, do you have any difficulty performing the following activities: 04/27/2019 04/03/2019  Hearing? N N  Vision? N N  Difficulty concentrating or making decisions? N N  Walking or climbing stairs? N N  Dressing or bathing? N N  Doing errands, shopping? N N  Preparing Food and eating ? N -  Using the Toilet? N -  In the past six months, have you accidently leaked urine? N -  Do you have problems with loss of bowel control? N -  Managing your Medications? N -  Managing your Finances? N -  Housekeeping or managing your Housekeeping? N -  Some recent data might be hidden     Immunizations and Health Maintenance Immunization History  Administered Date(s) Administered  . Fluad Quad(high Dose 65+) 12/11/2018  . Influenza Split 02/02/2011, 01/15/2012, 12/20/2017  . Influenza Whole 12/20/2007, 01/27/2009, 03/01/2010  . Influenza, High Dose Seasonal PF 02/09/2016, 12/25/2016, 12/20/2017  . Influenza,inj,Quad PF,6+ Mos 12/26/2012, 12/31/2013, 01/03/2015  . Pneumococcal Conjugate-13 02/08/2015  . Pneumococcal  Polysaccharide-23 12/10/2013  . Td 03/26/2000  . Tdap 10/10/2011, 08/22/2018  . Zoster 03/01/2010  . Zoster Recombinat (Shingrix) 08/02/2017, 10/19/2017   There are no preventive care reminders to display for this patient.  Patient Care Team: Marin Olp, MD as PCP - General (Family Medicine) Alanda Slim, Neena Rhymes, MD as Consulting Physician (Ophthalmology)  Indicate any recent Medical Services you may have received from other than [facility] providers in the past year (date may be approximate).    Assessment:   This is a routine wellness examination for [place].  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 0 0 0 No No  Number falls in past yr: 0 0 - - -  Injury with Fall? 0 0 - - -  Follow up Falls evaluation completed;Education provided;Falls prevention discussed - - - -    Is the patient's home free of loose throw rugs in walkways, pet beds, electrical cords, etc?   yes      Grab bars in the bathroom? yes      Handrails on the stairs?   yes      Adequate lighting?   yes  Cognitive Function:   6CIT Screen 04/27/2019  What Year? 0 points  What month? 0 points  What time? 0 points  Count back from 20 0 points  Months in reverse 0 points  Repeat phrase 0 points  Total Score 0    Screening Tests Health Maintenance  Topic Date Due  . Hepatitis C Screening  03/22/2098 (Originally 06-Feb-1951)  . COLONOSCOPY  04/09/2021  . TETANUS/TDAP   08/21/2028  . INFLUENZA VACCINE  Completed  . PNA vac Low Risk Adult  Completed    Qualifies for Shingles Vaccine? Shingrix completed   Cancer Screenings: Lung: Low Dose CT Chest recommended if Age 67-80 years, 30 pack-year currently smoking OR have quit w/in 15years. Patient does not qualify. Colorectal: colonoscopy 04/09/16 with Dr. Fuller Plan; repeat in 5 years     Plan:  I have personally reviewed and addressed the Medicare Annual Wellness questionnaire and have noted the following in the patient's chart:  A. Medical and social history B. Use of alcohol, tobacco or illicit drugs  C. Current medications and supplements D. Functional ability and status E.  Nutritional status F.  Physical activity G. Advance directives H. List of other physicians I.  Hospitalizations, surgeries, and ER visits in previous 12 months J.  [place] such as hearing and vision if needed, cognitive and depression L. Referrals, records requested, and appointments- none   In addition, I have reviewed and discussed with patient certain preventive protocols, quality metrics, and best practice recommendations. A written personalized care plan for preventive services as well as general preventive health recommendations were provided to patient.   Signed,  Denman George, LPN  Nurse Health Advisor   Nurse Notes: no additional

## 2019-05-12 ENCOUNTER — Ambulatory Visit: Payer: Medicare Other | Admitting: Family Medicine

## 2019-06-03 DIAGNOSIS — R072 Precordial pain: Secondary | ICD-10-CM | POA: Insufficient documentation

## 2019-06-03 NOTE — Progress Notes (Signed)
Cardiology Office Note   Date:  06/04/2019   ID:  Nathan Young, DOB 1950-05-24, MRN [id number]  PCP:  Marin Olp, MD  Cardiologist:   No primary care provider on file. Referring:  Marin Olp, MD  Chief Complaint  Patient presents with  . Palpitations      History of Present Illness: Nathan Young is a 69 y.o. male who is referred by Marin Olp, MD for evaluation of chest pain.  The patient was referred to the emergency room on December 31.  I reviewed these records for this visit.  He was actually referred because his blood pressure diary at home suggested that his systolic blood pressure was above 200.  It had been creeping up a little bit although somewhat fluctuating.  However, his mother had just died and he was under stress.  In the emergency room he was noted to have normal troponin.  EKG showed a right bundle branch block which was new compared with old EKGs.  However, he was found to have nonanginal chest pain.  He did have a mildly elevated troponin.  No further work-up was planned.  He really did not describe much in the way of chest pain but he was describing palpitations.  These were skipping beats that were not sustained.  He is not describing sustained rapid heart rates, presyncope or syncope.  The discomfort was associated with that and he was not describing any substernal discomfort, neck or arm discomfort.  He was not having any shortness of breath, PND or orthopnea.  Last summer he built a deck.  He still does work around his house.  He did not have any symptoms with that.  He is not been as active because of his dad being elderly and having chronic conditions.  Past Medical History:  Diagnosis Date  . ANXIETY 11/07/2006  . HYPERLIPIDEMIA 07/31/2007  . HYPERTENSION 11/07/2006  . OSTEOARTHRITIS 11/07/2006  . Sleep apnea    hasn't used cpap for "10-12 years"    Past Surgical History:  Procedure Laterality Date  . CATARACT EXTRACTION     bilateral  . HERNIA REPAIR     2001 Dr Lennie Hummer left groin; right 2012  . KNEE ARTHROSCOPY Right 2008     Current Outpatient Medications  Medication Sig Dispense Refill  . amLODipine (NORVASC) 5 MG tablet Take 1 tablet (5 mg total) by mouth daily. (Patient taking differently: Take 7 mg by mouth daily. TAKING 1 TABLET AND 1/2 TABLET) 30 tablet 5  . atenolol (TENORMIN) 50 MG tablet TAKE 1 TABLET BY MOUTH EVERY DAY 90 tablet 3  . atorvastatin (LIPITOR) 20 MG tablet TAKE 1 TABLET BY MOUTH EVERY DAY 90 tablet 3  . diazepam (VALIUM) 5 MG tablet TAKE 1/2 TO 1 TABLET BY MOUTH EVERY DAY AS NEEDED FOR SLEEP 30 tablet 5  . diclofenac sodium (VOLTAREN) 1 % GEL Apply 4 g topically 4 (four) times daily. 150 g 3  . QUEtiapine (SEROQUEL) 25 MG tablet TAKE 1/2 TABLET BY MOUTH AT BEDTIME AS NEEDED FOR SLEEP 45 tablet 3   No current facility-administered medications for this visit.    Allergies:   Patient has no known allergies.    Social History:  The patient  reports that he has never smoked. He has never used smokeless tobacco. He reports that he does not drink alcohol or use drugs.   Family History:  The patient's family history includes Heart disease in his mother; Hyperlipidemia in  his mother; Hypertension in his mother.    ROS:  Please see the history of present illness.   Otherwise, review of systems are positive for none.   All other systems are reviewed and negative.    PHYSICAL EXAM: VS:  BP 135/74   Pulse 84   Temp (!) 97.5 F (36.4 C)   Ht 5\' 9"  (1.753 m)   Wt 188 lb 9.6 oz (85.5 kg)   SpO2 98%   BMI 27.85 kg/m  , BMI Body mass index is 27.85 kg/m. GENERAL:  Well appearing HEENT:  Pupils equal round and reactive, fundi not visualized, oral mucosa unremarkable NECK:  No jugular venous distention, waveform within normal limits, carotid upstroke brisk and symmetric, no bruits, no thyromegaly LYMPHATICS:  No cervical, inguinal adenopathy LUNGS:  Clear to auscultation bilaterally  BACK:  No CVA tenderness CHEST:  Unremarkable HEART:  PMI not displaced or sustained,S1 and S2 within normal limits, no S3, no S4, no clicks, no rubs, no murmurs ABD:  Flat, positive bowel sounds normal in frequency in pitch, no bruits, no rebound, no guarding, no midline pulsatile mass, no hepatomegaly, no splenomegaly EXT:  2 plus pulses throughout, no edema, no cyanosis no clubbing SKIN:  No rashes no nodules NEURO:  Cranial nerves II through XII grossly intact, motor grossly intact throughout PSYCH:  Cognitively intact, oriented to person place and time    EKG:  EKG is ordered today. The ekg ordered today demonstrates sinus rhythm, rate 84, right bundle branch block, no acute ST-T wave changes.   Recent Labs: 01/27/2019: ALT 21 03/26/2019: BUN 17; Creatinine, Ser 1.40; Hemoglobin 15.7; Platelets 294; Potassium 4.5; Sodium 136    Lipid Panel    Component Value Date/Time   CHOL 127 01/27/2019 0952   TRIG 127.0 01/27/2019 0952   TRIG 84 02/27/2006 0828   HDL 32.00 (L) 01/27/2019 0952   CHOLHDL 4 01/27/2019 0952   VLDL 25.4 01/27/2019 0952   LDLCALC 70 01/27/2019 0952   LDLDIRECT 79.0 09/11/2018 1329      Wt Readings from Last 3 Encounters:  06/04/19 188 lb 9.6 oz (85.5 kg)  04/20/19 189 lb 9.6 oz (86 kg)  04/03/19 187 lb 6.4 oz (85 kg)      Other studies Reviewed: Additional studies/ records that were reviewed today include: ED records. Review of the above records demonstrates:  Please see elsewhere in the note.     ASSESSMENT AND PLAN:  PALPITATIONS:   He is really not feeling these any longer.  No change in therapy.  HTN: He has had amlodipine added and dose increased and I did review a blood pressure diary.  This seems to be controlling his blood pressure well.  No change in therapy.  RBBB: He does have an abnormal EKG.  He has some atypical symptoms and risk factors to include family history. I will bring the patient back for a POET (Plain Old Exercise Test).  This will allow me to screen for obstructive coronary disease, risk stratify and very importantly provide a prescription for exercise.  He is otherwise not having any symptoms associated with this and we did talk about this diagnosis and no further work-up would be suggested.  DYSLIPIDEMIA:    LDL was 70 recently.  No change in therapy.  CKD: I did see that his creatinine was 1.4.  It actually been above 2 in the past when he was started on diclofenac.  We talked about this.  This will be followed by his primary  provider.  COVID EDUCATION:    He has had both doses of vaccine    Current medicines are reviewed at length with the patient today.  The patient does not have concerns regarding medicines.  The following changes have been made:  no change  Labs/ tests ordered today include:   Orders Placed This Encounter  Procedures  . EXERCISE TOLERANCE TEST (ETT)  . EKG 12-Lead     Disposition:   FU with me as needed.     Signed, Minus Breeding, MD  06/04/2019 12:29 PM    [facility]

## 2019-06-04 ENCOUNTER — Other Ambulatory Visit: Payer: Self-pay

## 2019-06-04 ENCOUNTER — Encounter: Payer: Self-pay | Admitting: Cardiology

## 2019-06-04 ENCOUNTER — Ambulatory Visit (INDEPENDENT_AMBULATORY_CARE_PROVIDER_SITE_OTHER): Payer: Medicare Other | Admitting: Cardiology

## 2019-06-04 VITALS — BP 135/74 | HR 84 | Temp 97.5°F | Ht 69.0 in | Wt 188.6 lb

## 2019-06-04 DIAGNOSIS — Z7189 Other specified counseling: Secondary | ICD-10-CM | POA: Diagnosis not present

## 2019-06-04 DIAGNOSIS — I10 Essential (primary) hypertension: Secondary | ICD-10-CM

## 2019-06-04 DIAGNOSIS — R072 Precordial pain: Secondary | ICD-10-CM

## 2019-06-04 NOTE — Patient Instructions (Signed)
Medication Instructions:  No changes *If you need a refill on your cardiac medications before your next appointment, please call your pharmacy*  Lab Work: None needed this visit  Testing/Procedures: Your physician has requested that you have an exercise tolerance test. For further information please visit [email or web address]. Please also follow instruction sheet, as given.   You will need a Covid screen 3 days before your exercise tolerance test. This is a Drive Up Visit at the [facility] [address] [place]. Someone will direct you to the appropriate testing line. Stay in your car and someone will be with you shortly.   Follow-Up: At [facility], you and your health needs are our priority.  As part of our continuing mission to provide you with exceptional heart care, we have created designated Provider Care Teams.  These Care Teams include your primary Cardiologist (physician) and Advanced Practice Providers (APPs -  Physician Assistants and Nurse Practitioners) who all work together to provide you with the care you need, when you need it.  We recommend signing up for the patient portal called "MyChart".  Sign up information is provided on this After Visit Summary.  MyChart is used to connect with patients for Virtual Visits (Telemedicine).  Patients are able to view lab/test results, encounter notes, upcoming appointments, etc.  Non-urgent messages can be sent to your provider as well.   To learn more about what you can do with MyChart, go to [email or web address].    Your next appointment:   Follow up as needed

## 2019-06-09 ENCOUNTER — Other Ambulatory Visit (HOSPITAL_COMMUNITY)
Admission: RE | Admit: 2019-06-09 | Discharge: 2019-06-09 | Disposition: A | Discharge status: Home / Self Care | Payer: Medicare Other | Source: Ambulatory Visit | Attending: Cardiology | Admitting: Cardiology

## 2019-06-09 DIAGNOSIS — Z20822 Contact with and (suspected) exposure to covid-19: Secondary | ICD-10-CM | POA: Diagnosis not present

## 2019-06-09 DIAGNOSIS — Z01812 Encounter for preprocedural laboratory examination: Secondary | ICD-10-CM | POA: Insufficient documentation

## 2019-06-09 LAB — SARS CORONAVIRUS 2 (TAT 6-24 HRS): SARS Coronavirus 2: NEGATIVE

## 2019-06-10 ENCOUNTER — Telehealth (HOSPITAL_COMMUNITY): Payer: Self-pay

## 2019-06-10 NOTE — Telephone Encounter (Signed)
Encounter complete. 

## 2019-06-12 ENCOUNTER — Other Ambulatory Visit: Payer: Self-pay

## 2019-06-12 ENCOUNTER — Ambulatory Visit (HOSPITAL_COMMUNITY)
Admission: RE | Admit: 2019-06-12 | Discharge: 2019-06-12 | Disposition: A | Discharge status: Home / Self Care | Payer: Medicare Other | Source: Ambulatory Visit | Attending: Cardiology | Admitting: Cardiology

## 2019-06-12 ENCOUNTER — Encounter (HOSPITAL_COMMUNITY): Payer: Self-pay | Admitting: *Deleted

## 2019-06-12 DIAGNOSIS — R072 Precordial pain: Secondary | ICD-10-CM | POA: Diagnosis not present

## 2019-06-12 LAB — EXERCISE TOLERANCE TEST
Estimated workload: 8.5 METS
Exercise duration (min): 7 min
Exercise duration (sec): 1 s
MPHR: 150 {beats}/min
Peak HR: 160 {beats}/min
Percent HR: 106 %
Rest HR: 105 {beats}/min

## 2019-06-12 NOTE — Progress Notes (Unsigned)
Abnormal ETT was reviewed by Dr. Hochrein. Patient was given the ok to be discharged to go home. 

## 2019-07-29 ENCOUNTER — Ambulatory Visit (INDEPENDENT_AMBULATORY_CARE_PROVIDER_SITE_OTHER): Payer: Medicare Other | Admitting: Family Medicine

## 2019-07-29 ENCOUNTER — Other Ambulatory Visit: Payer: Self-pay

## 2019-07-29 ENCOUNTER — Encounter: Payer: Self-pay | Admitting: Family Medicine

## 2019-07-29 VITALS — BP 130/68 | HR 75 | Temp 98.7°F | Ht 69.0 in | Wt 187.8 lb

## 2019-07-29 DIAGNOSIS — E785 Hyperlipidemia, unspecified: Secondary | ICD-10-CM

## 2019-07-29 DIAGNOSIS — R072 Precordial pain: Secondary | ICD-10-CM

## 2019-07-29 DIAGNOSIS — N183 Chronic kidney disease, stage 3 unspecified: Secondary | ICD-10-CM | POA: Diagnosis not present

## 2019-07-29 DIAGNOSIS — I10 Essential (primary) hypertension: Secondary | ICD-10-CM | POA: Diagnosis not present

## 2019-07-29 DIAGNOSIS — F419 Anxiety disorder, unspecified: Secondary | ICD-10-CM

## 2019-07-29 DIAGNOSIS — G4700 Insomnia, unspecified: Secondary | ICD-10-CM

## 2019-07-29 LAB — COMPREHENSIVE METABOLIC PANEL
ALT: 17 U/L (ref 0–53)
AST: 19 U/L (ref 0–37)
Albumin: 4.5 g/dL (ref 3.5–5.2)
Alkaline Phosphatase: 50 U/L (ref 39–117)
BUN: 19 mg/dL (ref 6–23)
CO2: 26 mEq/L (ref 19–32)
Calcium: 9.8 mg/dL (ref 8.4–10.5)
Chloride: 104 mEq/L (ref 96–112)
Creatinine, Ser: 1.38 mg/dL (ref 0.40–1.50)
GFR: 50.83 mL/min — ABNORMAL LOW (ref >60.00)
Glucose, Bld: 92 mg/dL (ref 70–99)
Potassium: 4.3 mEq/L (ref 3.5–5.1)
Sodium: 138 mEq/L (ref 135–145)
Total Bilirubin: 0.8 mg/dL (ref 0.2–1.2)
Total Protein: 7.3 g/dL (ref 6.0–8.3)

## 2019-07-29 MED ORDER — ATENOLOL 50 MG PO TABS: 50.0000 mg | ORAL_TABLET | Freq: Every day | ORAL | 3 refills | Status: DC

## 2019-07-29 MED ORDER — AMLODIPINE BESYLATE 5 MG PO TABS: 5.0000 mg | ORAL_TABLET | Freq: Every day | ORAL | 3 refills | Status: DC

## 2019-07-29 MED ORDER — AMLODIPINE BESYLATE 2.5 MG PO TABS: 2.5000 mg | ORAL_TABLET | Freq: Every day | ORAL | 3 refills | Status: DC

## 2019-07-29 NOTE — Patient Instructions (Addendum)
Health Maintenance Due  Topic Date Due  . COVID-19 Vaccine (1) will call with info on dates  Never done      Blood pressures look amazing. Stay on current medications. Let us know if you have any questions.   We have made your physical for 6 months sooner if needed.   Give our office a call or send my chart message with Covid vaccination information.

## 2019-07-29 NOTE — Progress Notes (Signed)
Phone [phone] In person visit   Subjective:   Nathan Young is a 69 y.o. year old very pleasant male patient who presents for/with See problem oriented charting Chief Complaint  Patient presents with  . Hyperlipidemia  . Hypertension   This visit occurred during the SARS-CoV-2 public health emergency.  Safety protocols were in place, including screening questions prior to the visit, additional usage of staff PPE, and extensive cleaning of exam room while observing appropriate contact time as indicated for disinfecting solutions.   Past Medical History-  Patient Active Problem List   Diagnosis Date Noted  . Precordial chest pain 06/03/2019    Priority: Medium  . Chronic kidney disease (CKD), stage III (moderate) 04/03/2019    Priority: Medium  . Hyperlipidemia 07/31/2007    Priority: Medium  . Insomnia 11/07/2006    Priority: Medium  . Essential hypertension 11/07/2006    Priority: Medium  . RBBB 04/23/2019    Priority: Low  . History of adenomatous polyp of colon 04/13/2016    Priority: Low  . Actinic keratosis 08/08/2015    Priority: Low  . Plantar fasciitis of left foot 05/16/2011    Priority: Low  . Osteoarthritis of right knee 11/07/2006    Priority: Low  . Anxiety 04/03/2019    Medications- reviewed and updated Current Outpatient Medications  Medication Sig Dispense Refill  . amLODipine (NORVASC) 5 MG tablet Take 1 tablet (5 mg total) by mouth daily. Take with 2.5mg  total 7.5mg  daily 90 tablet 3  . atenolol (TENORMIN) 50 MG tablet Take 1 tablet (50 mg total) by mouth daily. 90 tablet 3  . atorvastatin (LIPITOR) 20 MG tablet TAKE 1 TABLET BY MOUTH EVERY DAY 90 tablet 3  . diazepam (VALIUM) 5 MG tablet TAKE 1/2 TO 1 TABLET BY MOUTH EVERY DAY AS NEEDED FOR SLEEP 30 tablet 5  . diclofenac sodium (VOLTAREN) 1 % GEL Apply 4 g topically 4 (four) times daily. 150 g 3  . QUEtiapine (SEROQUEL) 25 MG tablet TAKE 1/2 TABLET BY MOUTH AT BEDTIME AS NEEDED FOR SLEEP 45  tablet 3  . amLODipine (NORVASC) 2.5 MG tablet Take 1 tablet (2.5 mg total) by mouth daily. Take with 5mg  total 7.5mg  daily 90 tablet 3   No current facility-administered medications for this visit.     Objective:  BP 130/68   Pulse 75   Temp 98.7 F (37.1 C) (Temporal)   Ht 5\' 9"  (1.753 m)   Wt 187 lb 12.8 oz (85.2 kg)   SpO2 97%   BMI 27.73 kg/m  Gen: NAD, resting comfortably CV: RRR no murmurs rubs or gallops Lungs: CTAB no crackles, wheeze, rhonchi Abdomen: soft/nontender/nondistended/normal bowel sounds.  Ext: no edema Skin: warm, dry    Assessment and Plan   #hyperlipidemia S: Medication: atorvastatin 20mg  Lab Results  Component Value Date   CHOL 127 01/27/2019   HDL 32.00 (L) 01/27/2019   LDLCALC 70 01/27/2019   LDLDIRECT 79.0 09/11/2018   TRIG 127.0 01/27/2019   CHOLHDL 4 01/27/2019   A/P:  Reasonable control- update lipids next visit  #hypertension/CKD III S: medication: amlodipine 7.5mg ,  Atenolol 50mg   Home readings #s:  Home readings 114/78 was last reading BP Readings from Last 3 Encounters:  07/29/19 130/68  06/04/19 135/74  04/20/19 120/82   GFR is typically-in the 50s  Patient is avoiding nsaids orally A/P: For hypertension-  Stable. Continue current medications.  For CKD stage III-update GFR today, avoid oral nsaids   # Insomnia/anxiety S: Seroquel 12.5mg   and valium- failed multiple other therapies in the past. Currently sleeping on average 7 hours of uninterrupted  sleep. He does take both medications nightly.   Has also been treated for anxiety in the past with buspirone-actually worsened anxiety.  This was around time of loss of his mother and near death of father in 07-15-18. A/P: Reasonable control of anxiety  #Atypical chest pain in late 07/15/2018 -low risk stress test 06/12/2019 with Dr. Ruthann Cancer noted the right bundle branch block makes more difficult to interpret.   #Primary osteoarthritis of right knee-worsening renal function on oral  NSAIDs in the past.  I recommended avoiding NSAIDs.  Can use Tylenol arthritis could do knee physical therapy as well- he feels like hes been doing better lately   Recommended follow up: 6 month physical  Lab/Order associations:   ICD-10-CM   1. Essential hypertension  I10 Comprehensive metabolic panel  2. Hyperlipidemia, unspecified hyperlipidemia type  E78.5   3. Insomnia, unspecified type  G47.00   4. Stage 3 chronic kidney disease, unspecified whether stage 3a or 3b CKD  N18.30   5. Precordial chest pain  R07.2   6. Anxiety  F41.9     Meds ordered this encounter  Medications  . amLODipine (NORVASC) 5 MG tablet    Sig: Take 1 tablet (5 mg total) by mouth daily. Take with 2.5mg  total 7.5mg  daily    Dispense:  90 tablet    Refill:  3  . atenolol (TENORMIN) 50 MG tablet    Sig: Take 1 tablet (50 mg total) by mouth daily.    Dispense:  90 tablet    Refill:  3  . amLODipine (NORVASC) 2.5 MG tablet    Sig: Take 1 tablet (2.5 mg total) by mouth daily. Take with 5mg  total 7.5mg  daily    Dispense:  90 tablet    Refill:  3   Return precautions advised.  Garret Reddish, MD

## 2019-10-23 ENCOUNTER — Other Ambulatory Visit: Payer: Self-pay | Admitting: Family Medicine

## 2019-10-23 NOTE — Telephone Encounter (Signed)
LR: 04-23-2019 Qty: 30 w 5 refills  Last office visit: 07-29-2019 Upcoming appointment: 02-01-2020

## 2019-11-04 IMAGING — MR MRI ABDOMEN WITH AND WITHOUT CONTRAST
16 series · 48 of 48 positions shown · IV contrast (multihance)
Comparison: Renal ultrasound on 10/01/2018

CLINICAL DATA: Complex cystic lesions in right kidney on recent
ultrasound.

EXAM:
MRI ABDOMEN WITHOUT AND WITH CONTRAST
TECHNIQUE: Multiplanar multisequence MR imaging of the abdomen was performed
both before and after the administration of intravenous contrast.
CONTRAST:  17mL MULTIHANCE GADOBENATE DIMEGLUMINE 529 MG/ML IV SOLN

[Series 2: T2 · coronal · 5.0mm · 0.74mm/px · 1 of 21 slices shown (1 of 3)]
[im 1/21]
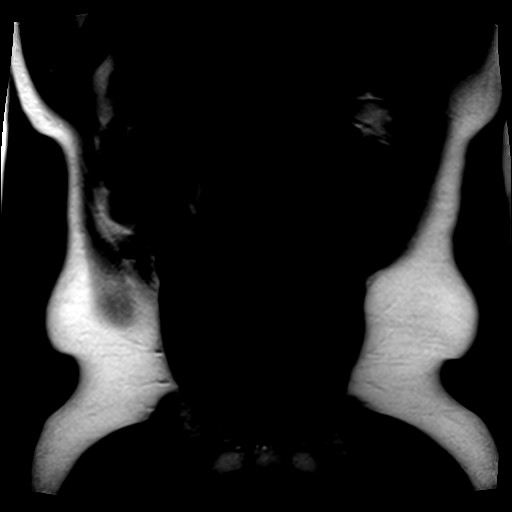

[Series 3: T2 · axial · 5.0mm · 0.74mm/px · 1 of 33 slices shown (2 of 3)]
[im 1/33]
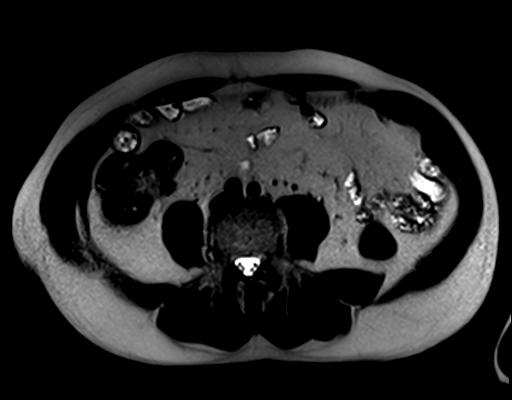

[Series 4: [id number]_trig · axial · 5.0mm · 1.98mm/px · z∈[-99,+80]mm · 3 of 93 slices shown]
[im 1/93]
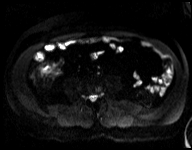
[im 47/93]
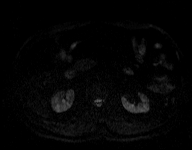
[im 93/93]
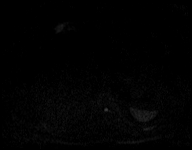

[Series 5: [id number]_trig_adc · axial · 5.0mm · 1.98mm/px · 1 of 31 slices shown]
[im 1/31]
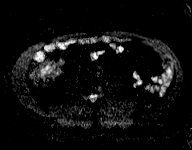

[Series 6: T2 · axial · 5.0mm · 1.48mm/px · 1 of 31 slices shown (3 of 3)]
[im 1/31]
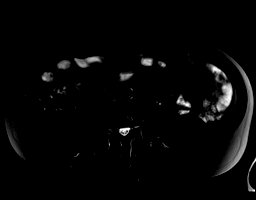

[Series 7: T1 · axial · 5.0mm · 0.74mm/px · z∈[-116,+75]mm · 3 of 66 slices shown]
[im 1/66]
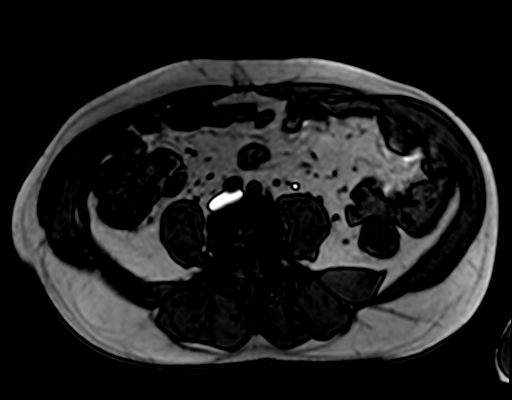
[im 33/66]
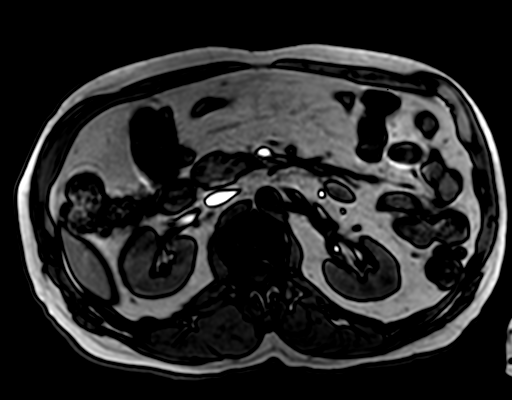
[im 66/66]
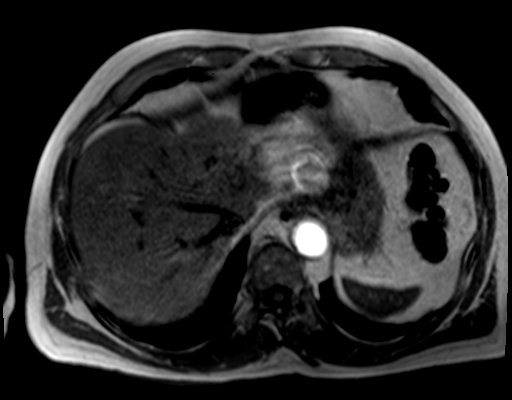

[Series 8: T1 dynamic · axial · non-contrast · 2.3mm · 1.48mm/px · z∈[-111,+70]mm · 4 of 80 slices shown]
[im 1/80]
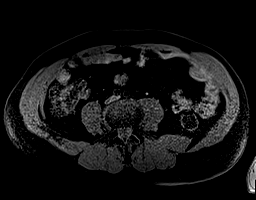
[im 27/80]
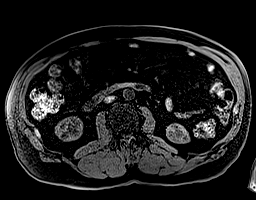
[im 53/80]
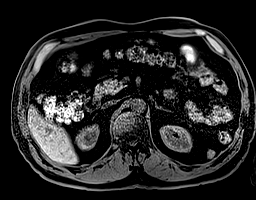
[im 80/80]
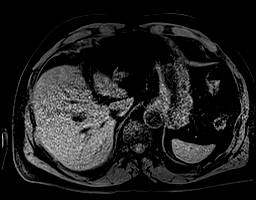

[Series 9: post 25 sec · axial · 2.3mm · 1.48mm/px · z∈[-111,+70]mm · 4 of 80 slices shown]
[im 1/80]
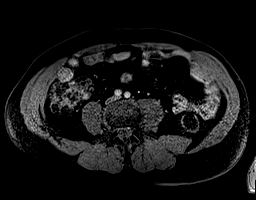
[im 27/80]
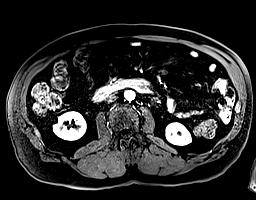
[im 53/80]
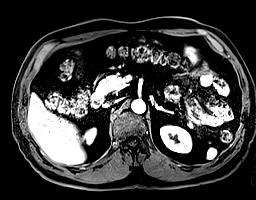
[im 80/80]
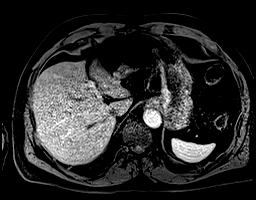

[Series 10: post 25 sec_sub · axial · 2.3mm · 1.48mm/px · z∈[-111,+70]mm · 4 of 80 slices shown]
[im 1/80]
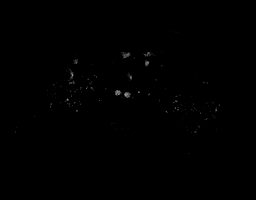
[im 27/80]
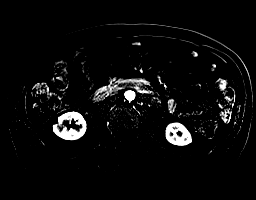
[im 53/80]
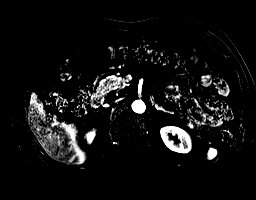
[im 80/80]
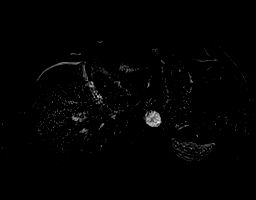

[Series 11: post 45 sec · axial · 2.3mm · 1.48mm/px · z∈[-111,+70]mm · 4 of 80 slices shown]
[im 1/80]
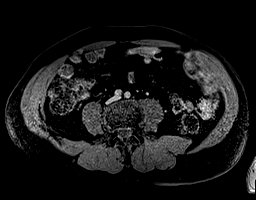
[im 27/80]
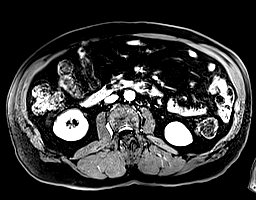
[im 53/80]
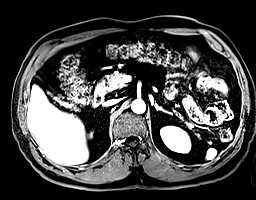
[im 80/80]
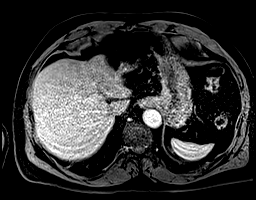

[Series 12: post 45 sec_sub · axial · 2.3mm · 1.48mm/px · z∈[-111,+70]mm · 4 of 80 slices shown]
[im 1/80]
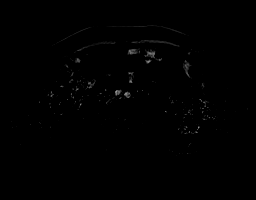
[im 27/80]
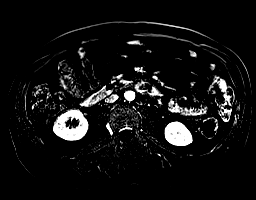
[im 53/80]
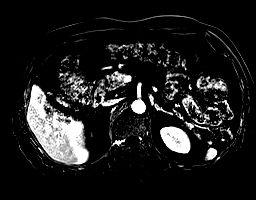
[im 80/80]
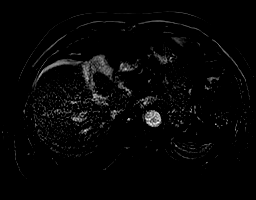

[Series 13: post 90 sec · axial · 2.3mm · 1.48mm/px · z∈[-111,+70]mm · 4 of 80 slices shown]
[im 1/80]
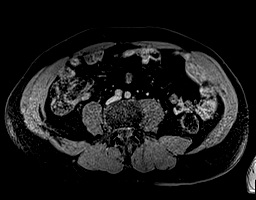
[im 27/80]
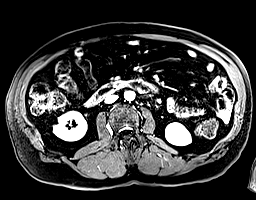
[im 53/80]
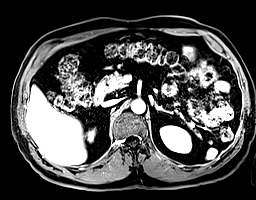
[im 80/80]
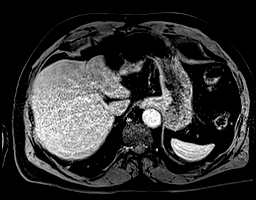

[Series 14: post 90 sec_sub · axial · 2.3mm · 1.48mm/px · z∈[-111,+70]mm · 4 of 80 slices shown]
[im 1/80]
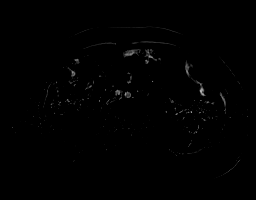
[im 27/80]
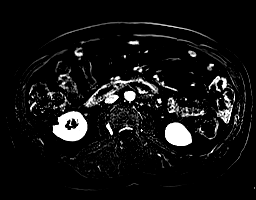
[im 53/80]
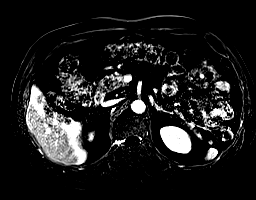
[im 80/80]
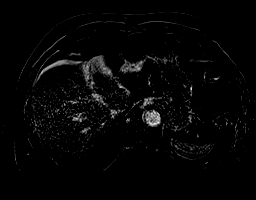

[Series 15: T1 dynamic post-contrast · coronal · 2.5mm · 0.74mm/px · 2 of 52 slices shown]
[im 1/52]
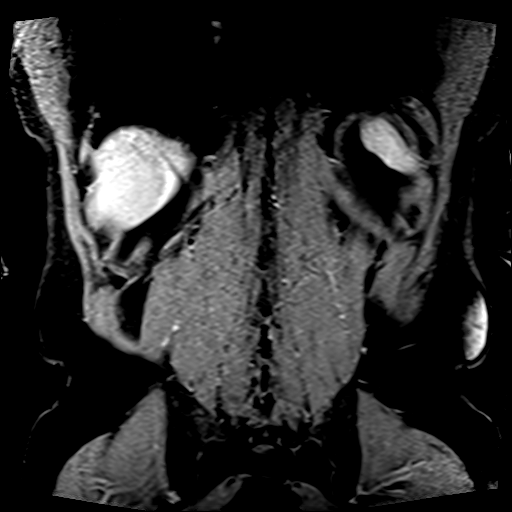
[im 52/52]
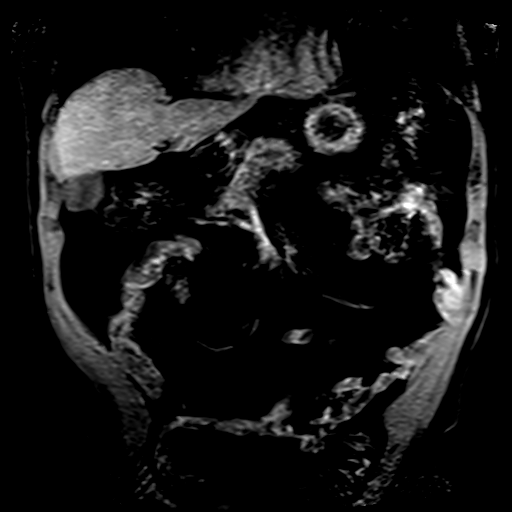

[Series 16: post axial 3+ · axial · 2.3mm · 1.48mm/px · z∈[-111,+70]mm · 4 of 80 slices shown (1 of 2)]
[im 1/80]
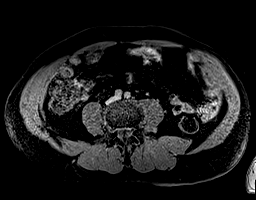
[im 27/80]
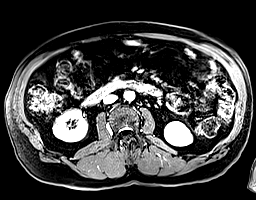
[im 53/80]
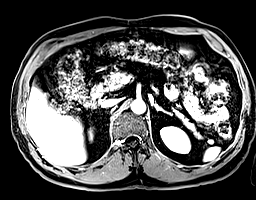
[im 80/80]
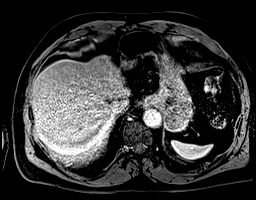

[Series 17: post axial 3+ · axial · 2.3mm · 1.48mm/px · z∈[-111,+70]mm · 4 of 80 slices shown (2 of 2)]
[im 1/80]
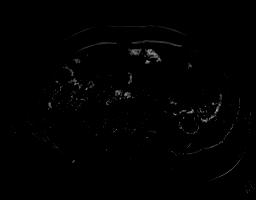
[im 27/80]
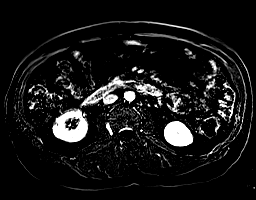
[im 53/80]
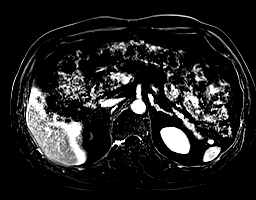
[im 80/80]
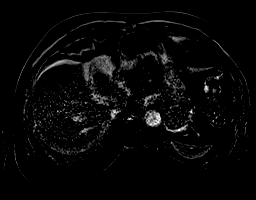

[48 of 48 positions shown; findings below may reference images not displayed]

FINDINGS: Lower chest: No acute findings.

Hepatobiliary: No hepatic masses identified. Gallbladder is
unremarkable. No evidence of biliary ductal dilatation.

Pancreas:  No mass or inflammatory changes.

Spleen:  Within normal limits in size and appearance.

Adrenals/Urinary Tract: 2 benign-appearing subcapsular cysts are
seen in the lower pole the right kidney which measure 1.4 cm and
cm, and correspond with the cystic lesions seen on recent
ultrasound. A few other tiny sub-cm cysts are noted in both kidneys.
No complex cystic or solid masses are seen. No evidence of
hydronephrosis.

Stomach/Bowel: Visualized portion unremarkable.

Vascular/Lymphatic: No pathologically enlarged lymph nodes
identified. No abdominal aortic aneurysm.

Other:  None.

Musculoskeletal:  No suspicious bone lesions identified.
IMPRESSION: Small benign-appearing right renal cysts, which correspond with the
lesions seen on recent ultrasound. No evidence of renal mass,
hydronephrosis, or other significant abnormality.

## 2020-01-28 NOTE — Patient Instructions (Addendum)
Please stop by lab before you go If you have mychart- we will send your results within 3 business days of [place] receiving them.  If you do not have mychart- we will call you about results within 5 business days of [place] receiving them.  *please note we are currently using [facility] which has a longer processing time than [place] typically so labs may not come back as quickly as in the past *please also note that you will see labs on mychart as soon as they post. I will later go in and write notes on them- will say "notes from Dr. Yong Channel"  We have booster dates but not original pfizer dates- please let us know and we can update  No changes today  If you have worsening or more frequent palpitations or not associated with stress of caring for dad- let us know and lets schedule a follow up to work up further

## 2020-01-28 NOTE — Progress Notes (Signed)
Phone: [phone]   Subjective:  Patient presents today for their annual physical. Chief complaint-noted.   See problem oriented charting- ROS- full  review of systems was completed and negative  except for: occasional palpitations when in higher stress situation such as caring for dad  The following were reviewed and entered/updated in epic: Past Medical History:  Diagnosis Date  . ANXIETY 11/07/2006  . HYPERLIPIDEMIA 07/31/2007  . HYPERTENSION 11/07/2006  . OSTEOARTHRITIS 11/07/2006  . Sleep apnea    hasn't used cpap for "10-12 years"   Patient Active Problem List   Diagnosis Date Noted  . Precordial chest pain 06/03/2019    Priority: Medium  . Chronic kidney disease (CKD), stage III (moderate) ([place]) 04/03/2019    Priority: Medium  . Hyperlipidemia 07/31/2007    Priority: Medium  . Insomnia 11/07/2006    Priority: Medium  . Essential hypertension 11/07/2006    Priority: Medium  . RBBB 04/23/2019    Priority: Low  . History of adenomatous polyp of colon 04/13/2016    Priority: Low  . Actinic keratosis 08/08/2015    Priority: Low  . Plantar fasciitis of left foot 05/16/2011    Priority: Low  . Osteoarthritis of right knee 11/07/2006    Priority: Low  . Anxiety 04/03/2019   Past Surgical History:  Procedure Laterality Date  . CATARACT EXTRACTION     bilateral  . HERNIA REPAIR     2001 Dr Lennie Hummer left groin; right 2012  . KNEE ARTHROSCOPY Right 2008    Family History  Problem Relation Age of Onset  . Heart disease Mother        pacer; MI late 27s or early 61s, CABG  . Hyperlipidemia Mother   . Hypertension Mother   . Pneumonia Father        84- hasnt bounced back yet  . Colon cancer Neg Hx   . Esophageal cancer Neg Hx   . Stomach cancer Neg Hx   . Rectal cancer Neg Hx     Medications- reviewed and updated Current Outpatient Medications  Medication Sig Dispense Refill  . amLODipine (NORVASC) 2.5 MG tablet Take 1 tablet (2.5 mg total) by mouth daily.  Take with 5mg  total 7.5mg  daily 90 tablet 3  . amLODipine (NORVASC) 5 MG tablet Take 1 tablet (5 mg total) by mouth daily. Take with 2.5mg  total 7.5mg  daily 90 tablet 3  . atenolol (TENORMIN) 50 MG tablet Take 1 tablet (50 mg total) by mouth daily. 90 tablet 3  . atorvastatin (LIPITOR) 20 MG tablet TAKE 1 TABLET BY MOUTH EVERY DAY 90 tablet 3  . diazepam (VALIUM) 5 MG tablet TAKE 1/2 TO 1 TABLET BY MOUTH EVERY DAY AS NEEDED FOR SLEEP 30 tablet 5  . diclofenac sodium (VOLTAREN) 1 % GEL Apply 4 g topically 4 (four) times daily. (Patient taking differently: Apply 4 g topically as needed. ) 150 g 3  . QUEtiapine (SEROQUEL) 25 MG tablet TAKE 1/2 TABLET BY MOUTH AT BEDTIME AS NEEDED FOR SLEEP 45 tablet 3  . diclofenac Sodium (VOLTAREN) 1 % GEL Apply 2 g topically 4 (four) times daily. 150 g 5   No current facility-administered medications for this visit.    Allergies-reviewed and updated No Known Allergies  Social History   Social History Narrative   Married 1977. [place]. [place]. 2 grandkids from [place] 2005, [place] 2007.    Objective  Objective:  BP 132/74   Pulse 74   Temp 98.5 F (36.9 C) (  Temporal)   Resp 18   Ht 5\' 9"  (1.753 m)   Wt 181 lb 3.2 oz (82.2 kg)   SpO2 97%   BMI 26.76 kg/m  Gen: NAD, resting comfortably HEENT: Mucous membranes are moist. Oropharynx normal Neck: no thyromegaly CV: RRR no murmurs rubs or gallops Lungs: CTAB no crackles, wheeze, rhonchi Abdomen: soft/nontender/nondistended/normal bowel sounds. No rebound or guarding.  Ext: no edema Skin: warm, dry Neuro: grossly normal, moves all extremities, PERRLA    Assessment and Plan  69 y.o. male presenting for annual physical.  Health Maintenance counseling: 1. Anticipatory guidance: Patient counseled regarding regular dental exams q6 months, eye exams yearly,  avoiding smoking and second hand smoke, limiting alcohol to 2 beverages per day- no beer in 50 years.   2. Risk factor  reduction:  Advised patient of need for regular exercise and diet rich and fruits and vegetables to reduce risk of heart attack and stroke. Exercise- knee issues come and goes- still remaining active encouraged 150 minutes a week. Diet-down 9 lbs in last year- reports some stress and trying to lay off some sweets like cookies.  Wt Readings from Last 3 Encounters:  02/01/20 181 lb 3.2 oz (82.2 kg)  07/29/19 187 lb 12.8 oz (85.2 kg)  06/04/19 188 lb 9.6 oz (85.5 kg)   3. Immunizations/screenings/ancillary studies- will call back with dates of first 2 covid vaccines Immunization History  Administered Date(s) Administered  . Fluad Quad(high Dose 65+) 12/11/2018, 12/29/2019  . Influenza Split 02/02/2011, 01/15/2012, 12/20/2017  . Influenza Whole 12/20/2007, 01/27/2009, 03/01/2010  . Influenza, High Dose Seasonal PF 02/09/2016, 12/25/2016, 12/20/2017  . Influenza,inj,Quad PF,6+ Mos 12/26/2012, 12/31/2013, 01/03/2015  . PFIZER SARS-COV-2 Vaccination 01/18/2020  . Pneumococcal Conjugate-13 02/08/2015  . Pneumococcal Polysaccharide-23 12/10/2013  . Td 03/26/2000  . Tdap 10/10/2011, 08/22/2018  . Zoster 03/01/2010  . Zoster Recombinat (Shingrix) 08/02/2017, 10/19/2017   4. Prostate cancer screening- passed age based screening recommendations- discontinue screening  Lab Results  Component Value Date   PSA 1.46 01/24/2018   PSA 1.59 01/23/2017   PSA 1.22 01/31/2016   5. Colon cancer screening - 04/09/2016 with 5 year repeat due to adenomatous colon polyps 6. Skin cancer screening- saw Dr. [place] Crane dermatology recently- only precancers no cancer. advised regular sunscreen use. Denies worrisome, changing, or new skin lesions.  7. Never smoker 8. STD screening - opts out as monogomous  Status of chronic or acute concerns   #hypertension/CKD stage III- prior diclofenac S: medication:  amlodipine 7.5 mg (increased january 2020), atenolol 50mg  usually at night.  Home readings #s: has noted some higher  numbers at home at times such as yesterday morning and was 107/76 and in afternoon it was 145/92 but is in a lot of stress caring for his father. #s not above 145 noted- and once again most higher numbers are when caring for his dad  GFR is typically-in the 50s  A/P: For hypertension- well controlled overall- continue current For CKD stage III-hopefully stable- update with labs today   #hyperlipidemia-LDL goal at least under 100.  Ideally under 70. S: Medication:Atorvastatin 20 mg Lab Results  Component Value Date   CHOL 127 01/27/2019   HDL 32.00 (L) 01/27/2019   LDLCALC 70 01/27/2019   LDLDIRECT 79.0 09/11/2018   TRIG 127.0 01/27/2019   CHOLHDL 4 01/27/2019  A/P: hopefully stable- update lipid panel and continue current meds for now   #Insomnia S: Medication: Seroquel 12.5 mg, diazepam 5 mg for sleep . If wakes up cannot  fall back to sleep  Has also been treated for anxiety in the past with buspirone-actually worsened anxiety.  This was around time of loss of his mother and near death of father in Jun 22, 2018- still caring for father 06-22-19. A/P: reasonable control-  Multitude of therapies tried in past- this has been best combo- continue current medications.   #Atypical chest pain in late 2018/06/22 with stress of caring for mother who had just passed and for father who was very sick -low risk stress test 06/12/2019 with Dr. Ruthann Cancer noted the right bundle branch block makes more difficult to interpret  #Primary osteoarthritis of right knee-worsening renal function on oral NSAIDs in the past.  I recommended avoiding NSAIDs.  Can use Tylenol arthritis could do knee physical therapy as well - doing better lately.   #palpitations x2 after caring for dad in last few weeks on a Sunday.  -check tsh -discussed ekg/cardiac monitoring but he declines for now -  If you have worsening or more frequent palpitations or not associated with stress of caring for dad- let us know and lets schedule a follow up  to work up further  Recommended follow up: Return in about 6 months (around 07/31/2020) for follow up- or sooner if needed.  Lab/Order associations:non fasting   ICD-10-CM   1. Preventative health care  W88.89 COMPLETE METABOLIC PANEL WITH GFR    Lipid panel    CBC with Differential/Platelet  2. Essential hypertension  V69 COMPLETE METABOLIC PANEL WITH GFR    CBC with Differential/Platelet  3. Anxiety  F41.9   4. Hyperlipidemia, unspecified hyperlipidemia type  E78.5 Lipid panel   Meds ordered this encounter  Medications  . diclofenac Sodium (VOLTAREN) 1 % GEL    Sig: Apply 2 g topically 4 (four) times daily.    Dispense:  150 g    Refill:  5    Return precautions advised.  Garret Reddish, MD

## 2020-02-01 ENCOUNTER — Other Ambulatory Visit: Payer: Self-pay

## 2020-02-01 ENCOUNTER — Ambulatory Visit (INDEPENDENT_AMBULATORY_CARE_PROVIDER_SITE_OTHER): Payer: Medicare Other | Admitting: Family Medicine

## 2020-02-01 ENCOUNTER — Encounter: Payer: Self-pay | Admitting: Family Medicine

## 2020-02-01 VITALS — BP 132/74 | HR 74 | Temp 98.5°F | Resp 18 | Ht 69.0 in | Wt 181.2 lb

## 2020-02-01 DIAGNOSIS — I10 Essential (primary) hypertension: Secondary | ICD-10-CM

## 2020-02-01 DIAGNOSIS — E785 Hyperlipidemia, unspecified: Secondary | ICD-10-CM

## 2020-02-01 DIAGNOSIS — F419 Anxiety disorder, unspecified: Secondary | ICD-10-CM | POA: Diagnosis not present

## 2020-02-01 DIAGNOSIS — Z0000 Encounter for general adult medical examination without abnormal findings: Secondary | ICD-10-CM

## 2020-02-01 MED ORDER — DICLOFENAC SODIUM 1 % EX GEL: 2.0000 g | Freq: Four times a day (QID) | CUTANEOUS | 5 refills | Status: DC

## 2020-02-02 LAB — CBC WITH DIFFERENTIAL/PLATELET
Absolute Monocytes: 552 cells/uL (ref 200–950)
Basophils Absolute: 62 cells/uL (ref 0–200)
Basophils Relative: 0.9 %
Eosinophils Absolute: 69 cells/uL (ref 15–500)
Eosinophils Relative: 1 %
HCT: 40.6 % (ref 38.5–50.0)
Hemoglobin: 14.1 g/dL (ref 13.2–17.1)
Lymphs Abs: 1380 cells/uL (ref 850–3900)
MCH: 33.5 pg — ABNORMAL HIGH (ref 27.0–33.0)
MCHC: 34.7 g/dL (ref 32.0–36.0)
MCV: 96.4 fL (ref 80.0–100.0)
MPV: 10 fL (ref 7.5–12.5)
Monocytes Relative: 8 %
Neutro Abs: 4837 cells/uL (ref 1500–7800)
Neutrophils Relative %: 70.1 %
Platelets: 290 10*3/uL (ref 140–400)
RBC: 4.21 10*6/uL (ref 4.20–5.80)
RDW: 13 % (ref 11.0–15.0)
Total Lymphocyte: 20 %
WBC: 6.9 10*3/uL (ref 3.8–10.8)

## 2020-02-02 LAB — COMPLETE METABOLIC PANEL WITH GFR
AG Ratio: 1.5 (calc) (ref 1.0–2.5)
ALT: 16 U/L (ref 9–46)
AST: 16 U/L (ref 10–35)
Albumin: 4.4 g/dL (ref 3.6–5.1)
Alkaline phosphatase (APISO): 51 U/L (ref 35–144)
BUN/Creatinine Ratio: 12 (calc) (ref 6–22)
BUN: 16 mg/dL (ref 7–25)
CO2: 25 mmol/L (ref 20–32)
Calcium: 10 mg/dL (ref 8.6–10.3)
Chloride: 105 mmol/L (ref 98–110)
Creat: 1.37 mg/dL — ABNORMAL HIGH (ref 0.70–1.18)
GFR, Est African American: 60 mL/min/{1.73_m2} (ref >=60)
GFR, Est Non African American: 52 mL/min/{1.73_m2} — ABNORMAL LOW (ref >=60)
Globulin: 2.9 g/dL (calc) (ref 1.9–3.7)
Glucose, Bld: 89 mg/dL (ref 65–99)
Potassium: 4.7 mmol/L (ref 3.5–5.3)
Sodium: 139 mmol/L (ref 135–146)
Total Bilirubin: 0.8 mg/dL (ref 0.2–1.2)
Total Protein: 7.3 g/dL (ref 6.1–8.1)

## 2020-02-02 LAB — LIPID PANEL
Cholesterol: 107 mg/dL (ref <200)
HDL: 35 mg/dL — ABNORMAL LOW (ref >=40)
LDL Cholesterol (Calc): 54 mg/dL (calc)
Non-HDL Cholesterol (Calc): 72 mg/dL (calc) (ref <130)
Total CHOL/HDL Ratio: 3.1 (calc) (ref <5.0)
Triglycerides: 93 mg/dL (ref <150)

## 2020-02-02 LAB — TSH: TSH: 1.31 mIU/L (ref 0.40–4.50)

## 2020-03-21 ENCOUNTER — Ambulatory Visit (INDEPENDENT_AMBULATORY_CARE_PROVIDER_SITE_OTHER): Payer: Medicare Other | Admitting: Family Medicine

## 2020-03-21 ENCOUNTER — Encounter: Payer: Self-pay | Admitting: Family Medicine

## 2020-03-21 ENCOUNTER — Other Ambulatory Visit: Payer: Self-pay

## 2020-03-21 ENCOUNTER — Other Ambulatory Visit: Payer: Self-pay | Admitting: Family Medicine

## 2020-03-21 VITALS — BP 116/80 | HR 90 | Temp 98.6°F | Resp 18 | Ht 69.0 in | Wt 173.6 lb

## 2020-03-21 DIAGNOSIS — R300 Dysuria: Secondary | ICD-10-CM

## 2020-03-21 DIAGNOSIS — R350 Frequency of micturition: Secondary | ICD-10-CM | POA: Diagnosis not present

## 2020-03-21 LAB — POCT URINALYSIS DIPSTICK
Bilirubin, UA: NEGATIVE
Glucose, UA: NEGATIVE
Ketones, UA: NEGATIVE
Nitrite, UA: POSITIVE
Protein, UA: POSITIVE — AB
Spec Grav, UA: 1.025 (ref 1.010–1.025)
Urobilinogen, UA: 1 E.U./dL
pH, UA: 6 (ref 5.0–8.0)

## 2020-03-21 MED ORDER — NITROFURANTOIN MONOHYD MACRO 100 MG PO CAPS: 100.0000 mg | ORAL_CAPSULE | Freq: Two times a day (BID) | ORAL | 0 refills | Status: DC

## 2020-03-21 NOTE — Progress Notes (Signed)
   Nathan Young is a 69 y.o. male who presents today for an office visit.  Assessment/Plan:  UTI UA and history consistent with UTI.  No red flags or signs of systemic illness.  Will start empiric Macrobid.  Check urine culture.  Encourage good oral hydration.  Discussed reasons to return to care.  Follow-up as needed.    Subjective:  HPI:  Patients with UTI symptoms for the past 3 days.  Symptoms include dysuria and frequency. No specific treatments tried. No fevers or chills. No back pain. Feels similar to prior UTIs.        Objective:  Physical Exam: BP 116/80   Pulse 90   Temp 98.6 F (37 C) (Temporal)   Resp 18   Ht 5\' 9"  (1.753 m)   Wt 173 lb 9.6 oz (78.7 kg)   SpO2 97%   BMI 25.64 kg/m   Gen: No acute distress, resting comfortably Neuro: Grossly normal, moves all extremities Psych: Normal affect and thought content      Tahisha Hakim M. , MD 03/21/2020 1:36 PM

## 2020-03-21 NOTE — Patient Instructions (Signed)
It was nice to see you!  You have a urinary tract infection. Please start the antibiotic.  We will check a urine culture to make sure you do not have a resistant bacteria. We will call you if we need to change your medications.   Please make sure you are drinking plenty of fluids over the next few days.  If your symptoms do not improve over the next 5-7 days, or if they worsen, please let us know. Please also let us know if you have worsening back pain, fevers, chills, or body aches.   Take care, Dr Lucynda Rosano  

## 2020-03-23 LAB — URINE CULTURE
MICRO NUMBER:: 11358287
SPECIMEN QUALITY:: ADEQUATE

## 2020-03-28 ENCOUNTER — Other Ambulatory Visit: Payer: Self-pay

## 2020-03-28 MED ORDER — CIPROFLOXACIN HCL 250 MG PO TABS: 250.0000 mg | ORAL_TABLET | Freq: Two times a day (BID) | ORAL | 0 refills | Status: DC

## 2020-03-28 NOTE — Progress Notes (Signed)
Please inform patient of the following:  Urine culture confirms UTI. We will need to change antibiotics. Please send in cipro 250mg  bid x 5 days.  . Katina Degree, MD 03/28/2020 9:17 AM

## 2020-03-29 ENCOUNTER — Telehealth: Payer: Self-pay

## 2020-03-29 NOTE — Telephone Encounter (Signed)
Pt was also prescribed another antibiotic this morning by his dentist. Pt is wanting to discuss with someone if he should be taking both.

## 2020-03-29 NOTE — Telephone Encounter (Signed)
Called and spoke with pt and explained the result note from Dr. Jimmey Ralph and he states the dentist put him on Amoxicillin for a dental procedure.

## 2020-03-29 NOTE — Telephone Encounter (Signed)
Patient is confused as to why he was sent in a RX of  ciprofloxacin (CIPRO) 250 MG tablet on  1/03 can we please call and explain to patient

## 2020-03-30 NOTE — Telephone Encounter (Signed)
Patient is returning a call from [place].

## 2020-03-31 NOTE — Telephone Encounter (Signed)
Never spoke with the patient. He may have me confused with another CMA. Looking in his chart I see that Francena Hanly, CMA left a message in regards to his lab results and Keba, CMA spoke with him about his antibiotic.

## 2020-04-01 ENCOUNTER — Telehealth: Payer: Self-pay | Admitting: *Deleted

## 2020-04-01 NOTE — Telephone Encounter (Signed)
Date of birth verified by patient  Lab results given: Urine culture confirms UTI. We will need to change antibiotics. Pt verbalized understanding  Pt pick up Rx Cipro has two more day to finish  Stated was taking Amoxicillin for dentist procedure. Stopped a few days ago. Advise to contact Dentist to informed him changes on medication  Will give [place] a call back if Symptoms persist

## 2020-04-04 ENCOUNTER — Telehealth: Payer: Self-pay

## 2020-04-04 DIAGNOSIS — N3000 Acute cystitis without hematuria: Secondary | ICD-10-CM

## 2020-04-04 NOTE — Telephone Encounter (Signed)
See below, pt saw Dr. Jerline Pain on 12*27. Another OV with you or ok to place referral to urology?

## 2020-04-04 NOTE — Telephone Encounter (Signed)
Did he take all of the ciprofloxacin that was sent in (2nd antibiotic?)? If so- recollect urine culture under acute cystitis

## 2020-04-04 NOTE — Telephone Encounter (Signed)
Patient is still having UTI symptoms, is not improving patient is requesting a call back. To figure out what he can do next. [phone] (M)

## 2020-04-04 NOTE — Telephone Encounter (Signed)
Called and spoke with pt and he states he did finish the antibiotic and it still burns when he urinates, he states he will get by the office tomorrow to leave a sample. Urine culture ordered and lab appointment made.

## 2020-04-05 ENCOUNTER — Other Ambulatory Visit: Payer: Medicare Other

## 2020-04-05 ENCOUNTER — Other Ambulatory Visit: Payer: Self-pay

## 2020-04-05 DIAGNOSIS — N3000 Acute cystitis without hematuria: Secondary | ICD-10-CM

## 2020-04-06 LAB — URINE CULTURE
MICRO NUMBER:: 11404501
Result:: NO GROWTH
SPECIMEN QUALITY:: ADEQUATE

## 2020-04-09 ENCOUNTER — Other Ambulatory Visit: Payer: Self-pay | Admitting: Family Medicine

## 2020-04-13 ENCOUNTER — Other Ambulatory Visit: Payer: Self-pay | Admitting: Family Medicine

## 2020-05-04 ENCOUNTER — Encounter: Payer: Self-pay | Admitting: Physician Assistant

## 2020-05-04 ENCOUNTER — Other Ambulatory Visit: Payer: Self-pay

## 2020-05-04 ENCOUNTER — Ambulatory Visit (INDEPENDENT_AMBULATORY_CARE_PROVIDER_SITE_OTHER): Payer: Medicare Other | Admitting: Physician Assistant

## 2020-05-04 VITALS — BP 120/82 | HR 76 | Temp 98.0°F | Resp 16 | Ht 69.0 in | Wt 172.0 lb

## 2020-05-04 DIAGNOSIS — R300 Dysuria: Secondary | ICD-10-CM | POA: Diagnosis not present

## 2020-05-04 LAB — POCT URINALYSIS DIPSTICK
Bilirubin, UA: NEGATIVE
Glucose, UA: NEGATIVE
Ketones, UA: NEGATIVE
Nitrite, UA: NEGATIVE
Protein, UA: POSITIVE — AB
Spec Grav, UA: 1.02 (ref 1.010–1.025)
Urobilinogen, UA: 0.2 E.U./dL
pH, UA: 5 (ref 5.0–8.0)

## 2020-05-04 MED ORDER — CIPROFLOXACIN HCL 500 MG PO TABS: 500.0000 mg | ORAL_TABLET | Freq: Two times a day (BID) | ORAL | 0 refills | Status: AC

## 2020-05-04 NOTE — Progress Notes (Signed)
Acute Office Visit  Subjective:    Patient ID: Nathan Young, male    DOB: 1950-11-07, 70 y.o.   MRN: [id number]  Chief Complaint  Patient presents with  . Dysuria    Had UTI in Dec was treated with 2 different abx. Yesterday was having painful urination, frequency. Did take Azo yesterday. Had been getting up more during the night.     HPI Patient is in today for dysuria, urinary urgency, and urinary frequency.  He states that he has had several UTI infections over the years.  His most recent one was in December 2021 and he was treated with Macrobid and then ciprofloxacin.  He is not sexually active.  He is a circumcised male.  He is currently taking AZO for symptoms.  Past Medical History:  Diagnosis Date  . ANXIETY 11/07/2006  . HYPERLIPIDEMIA 07/31/2007  . HYPERTENSION 11/07/2006  . OSTEOARTHRITIS 11/07/2006  . Sleep apnea    hasn't used cpap for "10-12 years"    Past Surgical History:  Procedure Laterality Date  . CATARACT EXTRACTION     bilateral  . HERNIA REPAIR     2001 Dr Lennie Hummer left groin; right 2012  . KNEE ARTHROSCOPY Right 2008    Family History  Problem Relation Age of Onset  . Heart disease Mother        pacer; MI late 4s or early 74s, CABG  . Hyperlipidemia Mother   . Hypertension Mother   . Pneumonia Father        33- hasnt bounced back yet  . Colon cancer Neg Hx   . Esophageal cancer Neg Hx   . Stomach cancer Neg Hx   . Rectal cancer Neg Hx     Social History   Socioeconomic History  . Marital status: Married    Spouse name: Not on file  . Number of children: Not on file  . Years of education: Not on file  . Highest education level: Not on file  Occupational History  . Occupation: Retired   Tobacco Use  . Smoking status: Never Smoker  . Smokeless tobacco: Never Used  Substance and Sexual Activity  . Alcohol use: No  . Drug use: No  . Sexual activity: Yes  Other Topics Concern  . Not on file  Social History Narrative   Married  1977. [place]. [place]. 2 grandkids from [place] 2005, [place] 2007.    Social Determinants of Health   Financial Resource Strain: Not on file  Food Insecurity: Not on file  Transportation Needs: Not on file  Physical Activity: Not on file  Stress: Not on file  Social Connections: Not on file  Intimate Partner Violence: Not on file    Outpatient Medications Prior to Visit  Medication Sig Dispense Refill  . amLODipine (NORVASC) 2.5 MG tablet Take 1 tablet (2.5 mg total) by mouth daily. Take with 5mg  total 7.5mg  daily 90 tablet 3  . amLODipine (NORVASC) 5 MG tablet Take 1 tablet (5 mg total) by mouth daily. Take with 2.5mg  total 7.5mg  daily 90 tablet 3  . atenolol (TENORMIN) 50 MG tablet Take 1 tablet (50 mg total) by mouth daily. 90 tablet 3  . atorvastatin (LIPITOR) 20 MG tablet TAKE 1 TABLET BY MOUTH EVERY DAY 90 tablet 3  . diazepam (VALIUM) 5 MG tablet TAKE 1/2 TO 1 TABLET BY MOUTH EVERY DAY AS NEEDED FOR SLEEP 30 tablet 5  . diclofenac Sodium (VOLTAREN) 1 % GEL Apply 4 g  topically as needed. 150 g 3  . QUEtiapine (SEROQUEL) 25 MG tablet TAKE 1/2 TABLET BY MOUTH AT BEDTIME AS NEEDED FOR SLEEP 45 tablet 3  . ciprofloxacin (CIPRO) 250 MG tablet Take 1 tablet (250 mg total) by mouth 2 (two) times daily. 10 tablet 0  . nitrofurantoin, macrocrystal-monohydrate, (MACROBID) 100 MG capsule Take 1 capsule (100 mg total) by mouth 2 (two) times daily. 14 capsule 0   No facility-administered medications prior to visit.    No Known Allergies  Review of Systems  Constitutional: Negative for chills and fever.  Gastrointestinal: Negative for abdominal pain, nausea and vomiting.  Genitourinary: Positive for dysuria, frequency and urgency. Negative for decreased urine volume, hematuria, penile discharge and penile pain.  Skin: Negative for rash.  Neurological: Negative for weakness.  Psychiatric/Behavioral: Negative for confusion.       Objective:    Physical Exam Vitals  and nursing note reviewed.  Constitutional:      Appearance: Normal appearance.  Cardiovascular:     Rate and Rhythm: Normal rate and regular rhythm.     Pulses: Normal pulses.     Heart sounds: Normal heart sounds.  Pulmonary:     Effort: Pulmonary effort is normal.     Breath sounds: Normal breath sounds.  Abdominal:     General: Abdomen is flat. Bowel sounds are normal.     Palpations: Abdomen is soft.     Tenderness: There is no right CVA tenderness or left CVA tenderness.  Skin:    General: Skin is warm and dry.     Findings: No rash.  Neurological:     Mental Status: He is alert.  Psychiatric:        Mood and Affect: Mood normal.     BP 120/82   Pulse 76   Temp 98 F (36.7 C) (Temporal)   Resp 16   Ht 5\' 9"  (1.753 m)   Wt 172 lb (78 kg)   SpO2 97%   BMI 25.40 kg/m  Wt Readings from Last 3 Encounters:  05/04/20 172 lb (78 kg)  03/21/20 173 lb 9.6 oz (78.7 kg)  02/01/20 181 lb 3.2 oz (82.2 kg)    Lab Results  Component Value Date   TSH 1.31 02/01/2020   Lab Results  Component Value Date   WBC 6.9 02/01/2020   HGB 14.1 02/01/2020   HCT 40.6 02/01/2020   MCV 96.4 02/01/2020   PLT 290 02/01/2020   Lab Results  Component Value Date   NA 139 02/01/2020   K 4.7 02/01/2020   CO2 25 02/01/2020   GLUCOSE 89 02/01/2020   BUN 16 02/01/2020   CREATININE 1.37 (H) 02/01/2020   BILITOT 0.8 02/01/2020   ALKPHOS 50 07/29/2019   AST 16 02/01/2020   ALT 16 02/01/2020   PROT 7.3 02/01/2020   ALBUMIN 4.5 07/29/2019   CALCIUM 10.0 02/01/2020   ANIONGAP 9 03/26/2019   GFR 50.83 (L) 07/29/2019   Lab Results  Component Value Date   CHOL 107 02/01/2020   Lab Results  Component Value Date   HDL 35 (L) 02/01/2020   Lab Results  Component Value Date   LDLCALC 54 02/01/2020   Lab Results  Component Value Date   TRIG 93 02/01/2020   Lab Results  Component Value Date   CHOLHDL 3.1 02/01/2020   No results found for: HGBA1C     Assessment & Plan:    Problem List Items Addressed This Visit   None   Visit Diagnoses  Dysuria    -  Primary   Relevant Medications   ciprofloxacin (CIPRO) 500 MG tablet   Other Relevant Orders   POCT Urinalysis Dipstick (Completed)   Urine Culture       Meds ordered this encounter  Medications  . ciprofloxacin (CIPRO) 500 MG tablet    Sig: Take 1 tablet (500 mg total) by mouth 2 (two) times daily for 5 days.    Dispense:  10 tablet    Refill:  0   Dysuria - U/A performed in office today. Will send urine for culture. Start treatment with ciprofloxacin 500 mg 1 tab po BID x 5 days. Increase water intake. May take AZO for symptomatic relief. Recheck sooner if fever, severe back pain, vomiting, or other acutely worsening symptoms.    This visit occurred during the SARS-CoV-2 public health emergency.  Safety protocols were in place, including screening questions prior to the visit, additional usage of staff PPE, and extensive cleaning of exam room while observing appropriate contact time as indicated for disinfecting solutions.    Ethelean Colla M Ileah Falkenstein, PA-C

## 2020-05-04 NOTE — Patient Instructions (Signed)
I will call with Urine culture results and change antibiotic if necessary.  Start on the Ciprofloxacin as directed. Push fluids and cran juice.  Call if worse or any change in symptoms.        Urinary Tract Infection, Adult  A urinary tract infection (UTI) is an infection of any part of the urinary tract. The urinary tract includes the kidneys, ureters, bladder, and urethra. These organs make, store, and get rid of urine in the body. An upper UTI affects the ureters and kidneys. A lower UTI affects the bladder and urethra. What are the causes? Most urinary tract infections are caused by bacteria in your genital area around your urethra, where urine leaves your body. These bacteria grow and cause inflammation of your urinary tract. What increases the risk? You are more likely to develop this condition if:  You have a urinary catheter that stays in place.  You are not able to control when you urinate or have a bowel movement (incontinence).  You are male and you: ? Use a spermicide or diaphragm for birth control. ? Have low estrogen levels. ? Are pregnant.  You have certain genes that increase your risk.  You are sexually active.  You take antibiotic medicines.  You have a condition that causes your flow of urine to slow down, such as: ? An enlarged prostate, if you are male. ? Blockage in your urethra. ? A kidney stone. ? A nerve condition that affects your bladder control (neurogenic bladder). ? Not getting enough to drink, or not urinating often.  You have certain medical conditions, such as: ? Diabetes. ? A weak disease-fighting system (immunesystem). ? Sickle cell disease. ? Gout. ? Spinal cord injury. What are the signs or symptoms? Symptoms of this condition include:  Needing to urinate right away (urgency).  Frequent urination. This may include small amounts of urine each time you urinate.  Pain or burning with urination.  Blood in the urine.  Urine  that smells bad or unusual.  Trouble urinating.  Cloudy urine.  Vaginal discharge, if you are male.  Pain in the abdomen or the lower back. You may also have:  Vomiting or a decreased appetite.  Confusion.  Irritability or tiredness.  A fever or chills.  Diarrhea. The first symptom in older adults may be confusion. In some cases, they may not have any symptoms until the infection has worsened. How is this diagnosed? This condition is diagnosed based on your medical history and a physical exam. You may also have other tests, including:  Urine tests.  Blood tests.  Tests for STIs (sexually transmitted infections). If you have had more than one UTI, a cystoscopy or imaging studies may be done to determine the cause of the infections. How is this treated? Treatment for this condition includes:  Antibiotic medicine.  Over-the-counter medicines to treat discomfort.  Drinking enough water to stay hydrated. If you have frequent infections or have other conditions such as a kidney stone, you may need to see a health care provider who specializes in the urinary tract (urologist). In rare cases, urinary tract infections can cause sepsis. Sepsis is a life-threatening condition that occurs when the body responds to an infection. Sepsis is treated in the hospital with IV antibiotics, fluids, and other medicines. Follow these instructions at home: Medicines  Take over-the-counter and prescription medicines only as told by your health care provider.  If you were prescribed an antibiotic medicine, take it as told by your health care provider.  Do not stop using the antibiotic even if you start to feel better. General instructions  Make sure you: ? Empty your bladder often and completely. Do not hold urine for long periods of time. ? Empty your bladder after sex. ? Wipe from front to back after urinating or having a bowel movement if you are male. Use each tissue only one time  when you wipe.  Drink enough fluid to keep your urine pale yellow.  Keep all follow-up visits. This is important.   Contact a health care provider if:  Your symptoms do not get better after 1-2 days.  Your symptoms go away and then return. Get help right away if:  You have severe pain in your back or your lower abdomen.  You have a fever or chills.  You have nausea or vomiting. Summary  A urinary tract infection (UTI) is an infection of any part of the urinary tract, which includes the kidneys, ureters, bladder, and urethra.  Most urinary tract infections are caused by bacteria in your genital area.  Treatment for this condition often includes antibiotic medicines.  If you were prescribed an antibiotic medicine, take it as told by your health care provider. Do not stop using the antibiotic even if you start to feel better.  Keep all follow-up visits. This is important. This information is not intended to replace advice given to you by your health care provider. Make sure you discuss any questions you have with your health care provider. Document Revised: 10/23/2019 Document Reviewed: 10/23/2019 Elsevier Patient Education  [place].

## 2020-05-06 LAB — URINE CULTURE
MICRO NUMBER:: 11514232
SPECIMEN QUALITY:: ADEQUATE

## 2020-05-12 ENCOUNTER — Telehealth: Payer: Self-pay | Admitting: Family Medicine

## 2020-05-12 NOTE — Telephone Encounter (Signed)
Pt returned call. Pt states he does not want to do an AWV at this time.

## 2020-05-12 NOTE — Telephone Encounter (Signed)
Left message for patient to call back and schedule Medicare Annual Wellness Visit (AWV) either virtually or in office. No detailed message left   Last AWVs 04/27/19  please schedule at anytime with  health coach  This should be a 45 minute visit.

## 2020-07-01 ENCOUNTER — Other Ambulatory Visit: Payer: Self-pay | Admitting: Family Medicine

## 2020-07-04 NOTE — Telephone Encounter (Signed)
Last Refill 04/13/2020 Last OV 02/01/2020 dx Preventative Health

## 2020-07-29 NOTE — Patient Instructions (Addendum)
  Health Maintenance Due  Topic Date Due  . COVID-19 Vaccine (4 - Booster for [employer] series) - if you get this done please let us know 07/18/2020   cholesterol well controlled but having muscle aches. I want him to take 2 weeks off of the medicine completely and then restart every other day- he will let me know if continues to have issues  We will call you within two weeks about your referral to Dr. Elmyra Ricks. If you do not hear within 2 weeks, give [place] a call.   Please stop by lab before you go If you have mychart- we will send your results within 3 business days of [place] receiving them.  If you do not have mychart- we will call you about results within 5 business days of [place] receiving them.  *please also note that you will see labs on mychart as soon as they post. I will later go in and write notes on them- will say "notes from Dr. Yong Channel"  Recommended follow up: Return in about 6 months (around 02/01/2021) for physical or sooner if needed.

## 2020-07-29 NOTE — Progress Notes (Addendum)
Phone [phone] In person visit   Subjective:   Nathan Young is a 70 y.o. year old very pleasant male patient who presents for/with See problem oriented charting Chief Complaint  Patient presents with  . Hypertension  . Chronic Kidney Disease    Stage 3  . Hyperlipidemia  . Insomnia   This visit occurred during the SARS-CoV-2 public health emergency.  Safety protocols were in place, including screening questions prior to the visit, additional usage of staff PPE, and extensive cleaning of exam room while observing appropriate contact time as indicated for disinfecting solutions.   Past Medical History-  Patient Active Problem List   Diagnosis Date Noted  . Precordial chest pain 06/03/2019    Priority: Medium  . Chronic kidney disease (CKD), stage III (moderate) ([place]) 04/03/2019    Priority: Medium  . Hyperlipidemia 07/31/2007    Priority: Medium  . Insomnia 11/07/2006    Priority: Medium  . Essential hypertension 11/07/2006    Priority: Medium  . RBBB 04/23/2019    Priority: Low  . History of adenomatous polyp of colon 04/13/2016    Priority: Low  . Actinic keratosis 08/08/2015    Priority: Low  . Plantar fasciitis of left foot 05/16/2011    Priority: Low  . Osteoarthritis of right knee 11/07/2006    Priority: Low  . Anxiety 04/03/2019    Medications- reviewed and updated Current Outpatient Medications  Medication Sig Dispense Refill  . amLODipine (NORVASC) 2.5 MG tablet Take 1 tablet (2.5 mg total) by mouth daily. Take with 5mg  total 7.5mg  daily 90 tablet 3  . amLODipine (NORVASC) 5 MG tablet TAKE 1 TABLET BY MOUTH DAILY (TAKE WITH 2.5MG  TABLET TOTAL 7.5MG  DAILY) 90 tablet 3  . atenolol (TENORMIN) 50 MG tablet Take 1 tablet (50 mg total) by mouth daily. 90 tablet 3  . atorvastatin (LIPITOR) 20 MG tablet TAKE 1 TABLET BY MOUTH EVERY DAY 90 tablet 3  . diazepam (VALIUM) 5 MG tablet TAKE 1/2 TO 1 TABLET BY MOUTH EVERY DAY AS NEEDED FOR SLEEP 30 tablet 5  .  diclofenac Sodium (VOLTAREN) 1 % GEL Apply 4 g topically as needed. 150 g 3  . QUEtiapine (SEROQUEL) 25 MG tablet TAKE 1/2 TABLET BY MOUTH AT BEDTIME AS NEEDED FOR SLEEP 45 tablet 3   No current facility-administered medications for this visit.     Objective:  BP 120/82   Pulse 66   Temp 98.4 F (36.9 C) (Temporal)   Ht 5\' 9"  (1.753 m)   Wt 171 lb 9.6 oz (77.8 kg)   SpO2 98%   BMI 25.34 kg/m  Gen: NAD, resting comfortably CV: RRR no murmurs rubs or gallops Lungs: CTAB no crackles, wheeze, rhonchi Abdomen: soft/nontender/nondistended/normal bowel sounds. Ext: no edema Skin: warm, dry     Assessment and Plan   #hypertension/CKD stage III S: medication:  amlodipine 7.5 mg (increased january 2020), atenolol 50mg  daily Home readings #s: 105/72 BP Readings from Last 3 Encounters:  08/01/20 120/82  05/04/20 120/82  03/21/20 116/80  GFR is typically-in the 50s  A/P: For hypertension- Stable. Continue current medications.  For CKD stage III-hopefully stable- update cmp today. Avoid nsaids - tylnol arthritis only   #hyperlipidemia-LDL goal at least under 100.  Ideally under 70. S: Medication:Atorvastatin 20 mg - muscle aches in thighs on med- took a day off and symptoms improved- worsened again when restarted.  Lab Results  Component Value Date   CHOL 107 02/01/2020   HDL 35 (L) 02/01/2020  LDLCALC 54 02/01/2020   LDLDIRECT 79.0 09/11/2018   TRIG 93 02/01/2020   CHOLHDL 3.1 02/01/2020  A/P: cholesterol well controlled but having muscle aches. I want him to take 2 weeks off of the medicine completely and then restart every other day- he will let me know if continues to have issues - Atypical chest pain in late 07/24/18 -low risk stress test 06/12/2019 with Dr. Ruthann Cancer noted the right bundle branch block makes more difficult to interpret. Better since loss of mom and dad   #Insomnia S: Medication: Seroquel 12.5 mg, diazepam 5 mg for sleep   Has also been treated for  anxiety in the past with buspirone-actually worsened anxiety.  This was around time of loss of his mother and near death of father in 07-24-18. A/P: doing reasonably well- only combo that has worked- continue to monitor.    #Right Knee pain with OA/right leg pain- arthroscopic surgery in past with Dr. Elmyra Ricks- will refer patient back- having some pain in right leg as well. Feels like he may have torn something in left upper leg moving his father (now decased) around.  He feels slightly weak in this area since he got tweaked-may need physical therapy  #no more UTI issues recently- treated twie in December and then February- has increased water and doing better  Recommended follow up: Return in about 6 months (around 02/01/2021) for physical or sooner if needed.  Lab/Order associations:   ICD-10-CM   1. Essential hypertension  I10 CBC with Differential/Platelet    Comprehensive metabolic panel  2. Stage 3 chronic kidney disease, unspecified whether stage 3a or 3b CKD (HCC)  N18.30 CBC with Differential/Platelet    Comprehensive metabolic panel  3. Hyperlipidemia, unspecified hyperlipidemia type  E78.5   4. Anxiety  F41.9   5. Chronic pain of right knee  M25.561 Ambulatory referral to Orthopedic Surgery   G89.29   6. Right leg pain  M79.604 Ambulatory referral to Orthopedic Surgery    No orders of the defined types were placed in this encounter.   Return precautions advised.  Garret Reddish, MD

## 2020-08-01 ENCOUNTER — Other Ambulatory Visit: Payer: Self-pay

## 2020-08-01 ENCOUNTER — Ambulatory Visit (INDEPENDENT_AMBULATORY_CARE_PROVIDER_SITE_OTHER): Payer: Medicare Other | Admitting: Family Medicine

## 2020-08-01 ENCOUNTER — Encounter: Payer: Self-pay | Admitting: Family Medicine

## 2020-08-01 VITALS — BP 120/82 | HR 66 | Temp 98.4°F | Ht 69.0 in | Wt 171.6 lb

## 2020-08-01 DIAGNOSIS — E785 Hyperlipidemia, unspecified: Secondary | ICD-10-CM

## 2020-08-01 DIAGNOSIS — N183 Chronic kidney disease, stage 3 unspecified: Secondary | ICD-10-CM | POA: Diagnosis not present

## 2020-08-01 DIAGNOSIS — I10 Essential (primary) hypertension: Secondary | ICD-10-CM

## 2020-08-01 DIAGNOSIS — G8929 Other chronic pain: Secondary | ICD-10-CM

## 2020-08-01 DIAGNOSIS — R351 Nocturia: Secondary | ICD-10-CM | POA: Diagnosis not present

## 2020-08-01 DIAGNOSIS — M25561 Pain in right knee: Secondary | ICD-10-CM

## 2020-08-01 DIAGNOSIS — F419 Anxiety disorder, unspecified: Secondary | ICD-10-CM | POA: Diagnosis not present

## 2020-08-01 DIAGNOSIS — M79604 Pain in right leg: Secondary | ICD-10-CM

## 2020-08-01 LAB — COMPREHENSIVE METABOLIC PANEL
ALT: 12 U/L (ref 0–53)
AST: 15 U/L (ref 0–37)
Albumin: 4.6 g/dL (ref 3.5–5.2)
Alkaline Phosphatase: 54 U/L (ref 39–117)
BUN: 17 mg/dL (ref 6–23)
CO2: 28 mEq/L (ref 19–32)
Calcium: 9.9 mg/dL (ref 8.4–10.5)
Chloride: 103 mEq/L (ref 96–112)
Creatinine, Ser: 1.39 mg/dL (ref 0.40–1.50)
GFR: 50.92 mL/min — ABNORMAL LOW (ref >60.00)
Glucose, Bld: 92 mg/dL (ref 70–99)
Potassium: 5 mEq/L (ref 3.5–5.1)
Sodium: 139 mEq/L (ref 135–145)
Total Bilirubin: 0.9 mg/dL (ref 0.2–1.2)
Total Protein: 7.5 g/dL (ref 6.0–8.3)

## 2020-08-01 LAB — CBC WITH DIFFERENTIAL/PLATELET
Basophils Absolute: 0.1 10*3/uL (ref 0.0–0.1)
Basophils Relative: 1 % (ref 0.0–3.0)
Eosinophils Absolute: 0.1 10*3/uL (ref 0.0–0.7)
Eosinophils Relative: 2.3 % (ref 0.0–5.0)
HCT: 40.9 % (ref 39.0–52.0)
Hemoglobin: 14.3 g/dL (ref 13.0–17.0)
Lymphocytes Relative: 23.2 % (ref 12.0–46.0)
Lymphs Abs: 1.4 10*3/uL (ref 0.7–4.0)
MCHC: 34.8 g/dL (ref 30.0–36.0)
MCV: 96.3 fl (ref 78.0–100.0)
Monocytes Absolute: 0.4 10*3/uL (ref 0.1–1.0)
Monocytes Relative: 6.8 % (ref 3.0–12.0)
Neutro Abs: 4.1 10*3/uL (ref 1.4–7.7)
Neutrophils Relative %: 66.7 % (ref 43.0–77.0)
Platelets: 268 10*3/uL (ref 150.0–400.0)
RBC: 4.25 Mil/uL (ref 4.22–5.81)
RDW: 13.5 % (ref 11.5–15.5)
WBC: 6.1 10*3/uL (ref 4.0–10.5)

## 2020-08-01 LAB — PSA: PSA: 5.65 ng/mL — ABNORMAL HIGH (ref 0.10–4.00)

## 2020-08-01 NOTE — Addendum Note (Signed)
Addended by: Marin Olp on: 08/01/2020 12:24 PM   Modules accepted: Orders

## 2020-08-02 ENCOUNTER — Other Ambulatory Visit: Payer: Self-pay

## 2020-08-02 DIAGNOSIS — R972 Elevated prostate specific antigen [PSA]: Secondary | ICD-10-CM

## 2020-08-11 ENCOUNTER — Ambulatory Visit (INDEPENDENT_AMBULATORY_CARE_PROVIDER_SITE_OTHER): Payer: Medicare Other | Admitting: Orthopaedic Surgery

## 2020-08-11 ENCOUNTER — Ambulatory Visit: Payer: Self-pay

## 2020-08-11 DIAGNOSIS — G8929 Other chronic pain: Secondary | ICD-10-CM | POA: Diagnosis not present

## 2020-08-11 DIAGNOSIS — M25561 Pain in right knee: Secondary | ICD-10-CM

## 2020-08-11 MED ORDER — LIDOCAINE HCL 1 % IJ SOLN
3.0000 mL | INTRAMUSCULAR | Status: AC | PRN
  Administered 2020-08-11: 3 mL

## 2020-08-11 MED ORDER — MELOXICAM 15 MG PO TABS: 15.0000 mg | ORAL_TABLET | Freq: Every day | ORAL | 3 refills | Status: DC | PRN

## 2020-08-11 MED ORDER — METHYLPREDNISOLONE ACETATE 40 MG/ML IJ SUSP
40.0000 mg | INTRAMUSCULAR | Status: AC | PRN
Start: 2020-08-11 — End: 2020-08-11
  Administered 2020-08-11: 40 mg via INTRA_ARTICULAR

## 2020-08-11 NOTE — Progress Notes (Signed)
Office Visit Note   Patient: Nathan Young           Date of Birth: 06-11-1950           MRN: [id number] Visit Date: 08/11/2020              Requested by: Marin Olp, MD [place],  [place] [address] PCP: Marin Olp, MD   Assessment & Plan: Visit Diagnoses:  1. Chronic pain of right knee     Plan: I would like to start him on meloxicam as anti-inflammatory and try steroid injection in the right knee today and he agreed to this treatment plan.  He would also benefit from outpatient physical therapy to strengthen the quads and muscles around the knees.  This would help with his balance coordination and getting up easier.  All questions and concerns were answered and addressed.  He agrees with this treatment plan.  Follow-Up Instructions: Return in about 6 weeks (around 09/22/2020).   Orders:  Orders Placed This Encounter  Procedures  . Large Joint Inj  . XR Knee 1-2 Views Right   Meds ordered this encounter  Medications  . meloxicam (MOBIC) 15 MG tablet    Sig: Take 1 tablet (15 mg total) by mouth daily as needed for pain.    Dispense:  30 tablet    Refill:  3      Procedures: Large Joint Inj: R knee on 08/11/2020 4:18 PM Indications: diagnostic evaluation and pain Details: 22 G 1.5 in needle, superolateral approach  Arthrogram: No  Medications: 3 mL lidocaine 1 %; 40 mg methylPREDNISolone acetate 40 MG/ML Outcome: tolerated well, no immediate complications Procedure, treatment alternatives, risks and benefits explained, specific risks discussed. Consent was given by the patient. Immediately prior to procedure a time out was called to verify the correct patient, procedure, equipment, support staff and site/side marked as required. Patient was prepped and draped in the usual sterile fashion.       Clinical Data: No additional findings.   Subjective: Chief Complaint  Patient presents with  . Right Knee - Pain  The patient is being  seen as a brand-new patient for the first time.  He is a 70 year old gentleman has been having right knee pain and quad weakness for a long period of time.  He actually had a history of a right knee arthroscopy done by one of my colleagues in town in 2008.  He still gets down on his knees on a daily basis and tries to be active but he is having more problems of weakness in that right knee and some pain as well.  He denies any locking catching with that knee.  He does have some pain in the knee that wakes him up at night.  HPI  Review of Systems There is currently listed no headache, chest pain, shortness of breath, fever, chills, nausea, vomiting  Objective: Vital Signs: There were no vitals taken for this visit.  Physical Exam He is alert and orient x3 and in no acute distress Ortho Exam Examination of both knees shows no knee joint effusion.  He does have weaker quads on the right side than the left side.  There is lateral joint line tenderness on the right knee as well but good range of motion of both knees.  Both knees are ligamentously stable. Specialty Comments:  No specialty comments available.  Imaging: XR Knee 1-2 Views Right  Result Date: 08/11/2020 2 views of the  right knee show moderate arthritis with lateral joint space narrowing and patellofemoral narrowing.  There are para-articular osteophytes.    PMFS History: Patient Active Problem List   Diagnosis Date Noted  . Precordial chest pain 06/03/2019  . RBBB 04/23/2019  . Chronic kidney disease (CKD), stage III (moderate) ([place]) 04/03/2019  . Anxiety 04/03/2019  . History of adenomatous polyp of colon 04/13/2016  . Actinic keratosis 08/08/2015  . Plantar fasciitis of left foot 05/16/2011  . Hyperlipidemia 07/31/2007  . Insomnia 11/07/2006  . Essential hypertension 11/07/2006  . Osteoarthritis of right knee 11/07/2006   Past Medical History:  Diagnosis Date  . ANXIETY 11/07/2006  . HYPERLIPIDEMIA 07/31/2007  .  HYPERTENSION 11/07/2006  . OSTEOARTHRITIS 11/07/2006  . Sleep apnea    hasn't used cpap for "10-12 years"    Family History  Problem Relation Age of Onset  . Heart disease Mother        pacer; MI late 63s or early 33s, CABG  . Hyperlipidemia Mother   . Hypertension Mother   . Pneumonia Father        71- didnt bounce back. died Jul 04, 2020  . Other Sister        MVC  . Heart attack Brother        age 69, smoker  . Colon cancer Neg Hx   . Esophageal cancer Neg Hx   . Stomach cancer Neg Hx   . Rectal cancer Neg Hx     Past Surgical History:  Procedure Laterality Date  . CATARACT EXTRACTION     bilateral  . HERNIA REPAIR     05-Jul-1999 Dr Lennie Hummer left groin; right 07-05-2010  . KNEE ARTHROSCOPY Right 2006-07-05   Social History   Occupational History  . Occupation: Retired   Tobacco Use  . Smoking status: Never Smoker  . Smokeless tobacco: Never Used  Substance and Sexual Activity  . Alcohol use: No  . Drug use: No  . Sexual activity: Yes

## 2020-08-17 ENCOUNTER — Telehealth: Payer: Self-pay

## 2020-08-17 NOTE — Telephone Encounter (Signed)
Patient is aware of Dr. Ansel Bong comments. And gave a verbal understanding.

## 2020-08-17 NOTE — Telephone Encounter (Signed)
I would not recommend this medication with chronic kidney disease stage III unfortunately-in the past he had significant worsening on on Voltaren which is in the same class of medication

## 2020-08-17 NOTE — Telephone Encounter (Signed)
See below

## 2020-08-17 NOTE — Telephone Encounter (Signed)
Patient got a prescriptions from [facility] for  meloxicam (MOBIC) 15 MG tablet and wants to make sure that Is okay to take with current medication.

## 2020-08-31 ENCOUNTER — Other Ambulatory Visit: Payer: Self-pay

## 2020-08-31 ENCOUNTER — Other Ambulatory Visit (INDEPENDENT_AMBULATORY_CARE_PROVIDER_SITE_OTHER): Payer: Medicare Other

## 2020-08-31 DIAGNOSIS — R972 Elevated prostate specific antigen [PSA]: Secondary | ICD-10-CM | POA: Diagnosis not present

## 2020-08-31 LAB — PSA: PSA: 3.7 ng/mL (ref 0.10–4.00)

## 2020-09-02 ENCOUNTER — Telehealth: Payer: Self-pay

## 2020-09-02 NOTE — Telephone Encounter (Signed)
Patient is returning call.  °

## 2020-09-02 NOTE — Telephone Encounter (Signed)
Called and ln for pt tcb.

## 2020-09-02 NOTE — Telephone Encounter (Signed)
Patient called in stating he missed a call from someone but didn't know who it was. Nathan Young states he spoke with someone earlier this week about wanting to know the side effects of the statin's. Did not see any messages or documentation.

## 2020-09-05 ENCOUNTER — Telehealth: Payer: Self-pay

## 2020-09-05 NOTE — Telephone Encounter (Signed)
Patient is wanting to know if statins could cause his elevated PSA levels. Advised that I did not know the answer to this, but I would route message to PCP. Advised that PCP is out of the office until next week. Please advise

## 2020-09-06 ENCOUNTER — Telehealth: Payer: Self-pay | Admitting: Orthopaedic Surgery

## 2020-09-06 ENCOUNTER — Other Ambulatory Visit: Payer: Self-pay

## 2020-09-06 DIAGNOSIS — G8929 Other chronic pain: Secondary | ICD-10-CM

## 2020-09-06 NOTE — Telephone Encounter (Signed)
Received call from [place] with [facility] (PT) advised patient is in there office now and forget (PT) order. Kennyth Lose asked if the order for right knee pain can be faxed over to her? The fax# is [phone]     [phone]

## 2020-09-06 NOTE — Telephone Encounter (Signed)
Faxed to provided number  

## 2020-09-08 NOTE — Telephone Encounter (Signed)
Pt called back told him Dr. Yong Channel said there are actually some associations with lower PSA on statins interestingly enough. Pt verbalized understanding and  said he has an appt next week with Urology. Told him hopefully they will be able to give you some answers.

## 2020-09-08 NOTE — Telephone Encounter (Signed)
Left message on voicemail to call office.  

## 2020-09-08 NOTE — Telephone Encounter (Signed)
There are actually some associations with lower PSA on statins interestingly enough

## 2020-09-13 ENCOUNTER — Other Ambulatory Visit: Payer: Self-pay | Admitting: Family Medicine

## 2020-09-26 ENCOUNTER — Other Ambulatory Visit: Payer: Self-pay | Admitting: Family Medicine

## 2020-09-28 ENCOUNTER — Other Ambulatory Visit: Payer: Self-pay

## 2020-09-28 ENCOUNTER — Ambulatory Visit (INDEPENDENT_AMBULATORY_CARE_PROVIDER_SITE_OTHER): Payer: Medicare Other | Admitting: Orthopaedic Surgery

## 2020-09-28 ENCOUNTER — Encounter: Payer: Self-pay | Admitting: Orthopaedic Surgery

## 2020-09-28 DIAGNOSIS — M25561 Pain in right knee: Secondary | ICD-10-CM | POA: Diagnosis not present

## 2020-09-28 DIAGNOSIS — M1711 Unilateral primary osteoarthritis, right knee: Secondary | ICD-10-CM | POA: Diagnosis not present

## 2020-09-28 DIAGNOSIS — G8929 Other chronic pain: Secondary | ICD-10-CM | POA: Diagnosis not present

## 2020-09-28 NOTE — Progress Notes (Signed)
The patient comes in today for follow-up for his right knee.  He is an active 70 year old gentleman with moderate arthritis and is right knee.  He has been going through physical therapy for that knee and has had at least 7 sessions.  He is done much better with quad strengthening with his knee and support of the knee with less pain from therapy.  We did place a steroid injection in the right knee if he did not get much response from the steroid injection.  He had also been on meloxicam which was stopped due to chronic kidney issues.  He is a thin individual.  He has excellent range of motion of his right knee and he does have improved quad strength.  There is some patellofemoral crepitation and medial joint line tenderness from his moderate osteoarthritis.  He will continue outpatient physical therapy.  He is the perfect candidate for hyaluronic acid for his right knee at this point given the failure of steroid injection.  I think this will help with his long-term pain from his osteoarthritis.  We will see if we can order this for his right knee.

## 2020-09-29 ENCOUNTER — Telehealth: Payer: Self-pay

## 2020-09-29 NOTE — Telephone Encounter (Signed)
Right knee gel injection  

## 2020-09-30 NOTE — Telephone Encounter (Signed)
Noted  

## 2020-10-13 ENCOUNTER — Telehealth: Payer: Self-pay

## 2020-10-13 ENCOUNTER — Telehealth: Payer: Self-pay | Admitting: Orthopaedic Surgery

## 2020-10-13 NOTE — Telephone Encounter (Signed)
VOB has been submitted for Durolane, right knee. Pending BV.

## 2020-10-13 NOTE — Telephone Encounter (Signed)
Called and left a message concerning gel injection.  Advised patient could give me a  call back.

## 2020-10-13 NOTE — Telephone Encounter (Signed)
Patient called. Checking on auth for gel injections. Would like a call. [phone]

## 2020-10-14 ENCOUNTER — Telehealth: Payer: Self-pay

## 2020-10-14 NOTE — Telephone Encounter (Signed)
Approved for Durolane, right knee. Leonore has been met Covered at 100% of the allowable amount. No Co-pay No PA required  Appt. 10/17/2020 with Dr. Ninfa Linden

## 2020-10-17 ENCOUNTER — Ambulatory Visit (INDEPENDENT_AMBULATORY_CARE_PROVIDER_SITE_OTHER): Payer: Medicare Other | Admitting: Orthopaedic Surgery

## 2020-10-17 DIAGNOSIS — M25561 Pain in right knee: Secondary | ICD-10-CM

## 2020-10-17 DIAGNOSIS — M1711 Unilateral primary osteoarthritis, right knee: Secondary | ICD-10-CM | POA: Diagnosis not present

## 2020-10-17 DIAGNOSIS — G8929 Other chronic pain: Secondary | ICD-10-CM

## 2020-10-17 MED ORDER — SODIUM HYALURONATE 60 MG/3ML IX PRSY
60.0000 mg | PREFILLED_SYRINGE | INTRA_ARTICULAR | Status: AC | PRN
  Administered 2020-10-17: 60 mg via INTRA_ARTICULAR

## 2020-10-17 NOTE — Progress Notes (Signed)
   Procedure Note  Patient: Nathan Young             Date of Birth: Jul 22, 1950           MRN: [id number]             Visit Date: 10/17/2020  Procedures: Visit Diagnoses:  1. Chronic pain of right knee   2. Unilateral primary osteoarthritis, right knee     Large Joint Inj: R knee on 10/17/2020 9:29 AM Indications: diagnostic evaluation and pain Details: 22 G 1.5 in needle, superolateral approach  Arthrogram: No  Medications: 60 mg Sodium Hyaluronate 60 MG/3ML Outcome: tolerated well, no immediate complications Procedure, treatment alternatives, risks and benefits explained, specific risks discussed. Consent was given by the patient. Immediately prior to procedure a time out was called to verify the correct patient, procedure, equipment, support staff and site/side marked as required. Patient was prepped and draped in the usual sterile fashion.    The patient is here today for scheduled hyaluronic acid injection with Durolane to treat the pain from osteoarthritis.  He has tried and failed other forms of conservative treatment.  He is in physical therapy now and that is helping in terms of getting his strength back with that right lower extremity.  He is also failed steroid injections for that knee.  Examination the right knee today shows no effusion.  He does have some weakness with quads but good range of motion and some global pain.  I did place Durolane in the right knee today without difficulty.  All questions and concerns were answered and addressed.  Follow-up at this point is as needed.

## 2020-11-14 ENCOUNTER — Other Ambulatory Visit: Payer: Self-pay

## 2020-11-14 ENCOUNTER — Ambulatory Visit (INDEPENDENT_AMBULATORY_CARE_PROVIDER_SITE_OTHER): Payer: Medicare Other | Admitting: Physician Assistant

## 2020-11-14 ENCOUNTER — Ambulatory Visit: Payer: Self-pay

## 2020-11-14 DIAGNOSIS — M542 Cervicalgia: Secondary | ICD-10-CM

## 2020-11-14 DIAGNOSIS — M549 Dorsalgia, unspecified: Secondary | ICD-10-CM | POA: Diagnosis not present

## 2020-11-14 DIAGNOSIS — M545 Low back pain, unspecified: Secondary | ICD-10-CM

## 2020-11-14 MED ORDER — CYCLOBENZAPRINE HCL 5 MG PO TABS: 5.0000 mg | ORAL_TABLET | Freq: Every day | ORAL | 0 refills | Status: DC

## 2020-11-14 MED ORDER — METHYLPREDNISOLONE 4 MG PO TABS: ORAL_TABLET | ORAL | 0 refills | Status: DC

## 2020-11-14 NOTE — Progress Notes (Signed)
Office Visit Note   Patient: Nathan Young           Date of Birth: 12-01-1950           MRN: [id number] Visit Date: 11/14/2020              Requested by: Nathan Olp, MD [place],   [id number] PCP: Nathan Olp, MD   Assessment & Plan: Visit Diagnoses:  1. Low back pain, unspecified back pain laterality, unspecified chronicity, unspecified whether sciatica present   2. Neck pain   3. Upper back pain     Plan: He is given neck exercises/handouts knees are reviewed.  We will see how he does with the Medrol Dosepak he is take no NSAIDs while on the Medrol Dosepak.  He is given Flexeril 5 mg to take at night only.  He was seen by pain persist or becomes worse.  He develops any radicular symptoms he will return sooner.  Questions encouraged and answered at length.  Follow-Up Instructions: Return if symptoms worsen or fail to improve.   Orders:  Orders Placed This Encounter  Procedures   XR Cervical Spine 2 or 3 views   XR Thoracic Spine 2 View   Meds ordered this encounter  Medications   cyclobenzaprine (FLEXERIL) 5 MG tablet    Sig: Take 1 tablet (5 mg total) by mouth at bedtime.    Dispense:  20 tablet    Refill:  0   methylPREDNISolone (MEDROL) 4 MG tablet    Sig: Take as directed    Dispense:  21 tablet    Refill:  0      Procedures: No procedures performed   Clinical Data: No additional findings.   Subjective: Chief Complaint  Patient presents with   Neck - Pain   Middle Back - Pain    HPI Nathan Young is well-known to Nathan Young service comes in today for neck and upper back pain.  He was sawing logs had a friend saw male's been taking the salt with lumbar up and stacking it.  He started having pain Friday between his shoulder blades.  He feels he may have pulled a muscle.  He does have some renal disease has been told to stay away from NSAIDs but did take some Aleve which has helped some.  No particular injury.  He does  note some spasms. Review of Systems Please see HPI.  Objective: Vital Signs: There were no vitals taken for this visit.  Physical Exam General: Well-developed well-nourished male no acute distress mood and affect appropriate Psych: Alert and oriented x3 Ortho Exam Cervical spine good range of motion cervical spine without pain.  He has tenderness over the cervical spinal column.  Also tenderness medial border scapula bilaterally.  Tenderness in the left trapezius region only.  Negative Spurling's.  Nontender over the thoracic spine.  Upper extremities 5 5 strength throughout against resistance.  Full range of motion bilateral shoulders without pain.  Full motor bilateral hands.  Full sensation bilateral hands light touch.  Deep tendon reflexes are 2+ at the biceps brachial radialis and triceps bilaterally.  Radial pulses are 2+ bilaterally equal symmetric. Specialty Comments:  No specialty comments available.  Imaging: XR Thoracic Spine 2 View  Result Date: 11/14/2020 Thoracic spine 2 views: No acute fractures.  Disc base overall well-maintained.  No bony abnormalities.  XR Cervical Spine 2 or 3 views  Result Date: 11/14/2020 Cervical spine 2 views: Shows endplate  spurring mid cervical spine with loss of disc space at C4-C5.  No spondylolisthesis.  Normal lordotic curvature.  No acute fractures.    PMFS History: Patient Active Problem List   Diagnosis Date Noted   Precordial chest pain 06/03/2019   RBBB 04/23/2019   Chronic kidney disease (CKD), stage III (moderate) ([place]) 04/03/2019   Anxiety 04/03/2019   History of adenomatous polyp of colon 04/13/2016   Actinic keratosis 08/08/2015   Plantar fasciitis of left foot 05/16/2011   Hyperlipidemia 07/31/2007   Insomnia 11/07/2006   Essential hypertension 11/07/2006   Osteoarthritis of right knee 11/07/2006   Past Medical History:  Diagnosis Date   ANXIETY 11/07/2006   HYPERLIPIDEMIA 07/31/2007   HYPERTENSION 11/07/2006    OSTEOARTHRITIS 11/07/2006   Sleep apnea    hasn't used cpap for "10-12 years"    Family History  Problem Relation Age of Onset   Heart disease Mother        pacer; MI late 53s or early 61s, CABG   Hyperlipidemia Mother    Hypertension Mother    Pneumonia Father        72- didnt bounce back. died 07/07/20   Other Sister        MVC   Heart attack Brother        age 40, smoker   Colon cancer Neg Hx    Esophageal cancer Neg Hx    Stomach cancer Neg Hx    Rectal cancer Neg Hx     Past Surgical History:  Procedure Laterality Date   CATARACT EXTRACTION     bilateral   HERNIA REPAIR     July 08, 1999 Dr Nathan Young left groin; right 2010/07/08   KNEE ARTHROSCOPY Right 07/08/2006   Social History   Occupational History   Occupation: Retired   Tobacco Use   Smoking status: Never   Smokeless tobacco: Never  Substance and Sexual Activity   Alcohol use: No   Drug use: No   Sexual activity: Yes

## 2021-02-07 ENCOUNTER — Ambulatory Visit (INDEPENDENT_AMBULATORY_CARE_PROVIDER_SITE_OTHER): Payer: Medicare Other | Admitting: Family Medicine

## 2021-02-07 ENCOUNTER — Encounter: Payer: Self-pay | Admitting: Family Medicine

## 2021-02-07 ENCOUNTER — Other Ambulatory Visit: Payer: Self-pay

## 2021-02-07 VITALS — BP 110/70 | HR 72 | Temp 98.5°F | Ht 69.0 in | Wt 177.0 lb

## 2021-02-07 DIAGNOSIS — I10 Essential (primary) hypertension: Secondary | ICD-10-CM

## 2021-02-07 DIAGNOSIS — N183 Chronic kidney disease, stage 3 unspecified: Secondary | ICD-10-CM

## 2021-02-07 DIAGNOSIS — R972 Elevated prostate specific antigen [PSA]: Secondary | ICD-10-CM

## 2021-02-07 DIAGNOSIS — G4700 Insomnia, unspecified: Secondary | ICD-10-CM

## 2021-02-07 DIAGNOSIS — E785 Hyperlipidemia, unspecified: Secondary | ICD-10-CM

## 2021-02-07 DIAGNOSIS — Z0000 Encounter for general adult medical examination without abnormal findings: Secondary | ICD-10-CM | POA: Diagnosis not present

## 2021-02-07 LAB — LIPID PANEL
Cholesterol: 108 mg/dL (ref 0–200)
HDL: 34.5 mg/dL — ABNORMAL LOW (ref >39.00)
LDL Cholesterol: 59 mg/dL (ref 0–99)
NonHDL: 73.41
Total CHOL/HDL Ratio: 3
Triglycerides: 71 mg/dL (ref 0.0–149.0)
VLDL: 14.2 mg/dL (ref 0.0–40.0)

## 2021-02-07 LAB — COMPREHENSIVE METABOLIC PANEL
ALT: 16 U/L (ref 0–53)
AST: 19 U/L (ref 0–37)
Albumin: 4.5 g/dL (ref 3.5–5.2)
Alkaline Phosphatase: 57 U/L (ref 39–117)
BUN: 17 mg/dL (ref 6–23)
CO2: 28 mEq/L (ref 19–32)
Calcium: 9.7 mg/dL (ref 8.4–10.5)
Chloride: 105 mEq/L (ref 96–112)
Creatinine, Ser: 1.49 mg/dL (ref 0.40–1.50)
GFR: 46.68 mL/min — ABNORMAL LOW (ref >60.00)
Glucose, Bld: 89 mg/dL (ref 70–99)
Potassium: 4.8 mEq/L (ref 3.5–5.1)
Sodium: 139 mEq/L (ref 135–145)
Total Bilirubin: 1 mg/dL (ref 0.2–1.2)
Total Protein: 7 g/dL (ref 6.0–8.3)

## 2021-02-07 LAB — CBC WITH DIFFERENTIAL/PLATELET
Basophils Absolute: 0 10*3/uL (ref 0.0–0.1)
Basophils Relative: 0.8 % (ref 0.0–3.0)
Eosinophils Absolute: 0.1 10*3/uL (ref 0.0–0.7)
Eosinophils Relative: 1.1 % (ref 0.0–5.0)
HCT: 37.5 % — ABNORMAL LOW (ref 39.0–52.0)
Hemoglobin: 12.9 g/dL — ABNORMAL LOW (ref 13.0–17.0)
Lymphocytes Relative: 18.1 % (ref 12.0–46.0)
Lymphs Abs: 1 10*3/uL (ref 0.7–4.0)
MCHC: 34.3 g/dL (ref 30.0–36.0)
MCV: 96 fl (ref 78.0–100.0)
Monocytes Absolute: 0.4 10*3/uL (ref 0.1–1.0)
Monocytes Relative: 7.8 % (ref 3.0–12.0)
Neutro Abs: 4.1 10*3/uL (ref 1.4–7.7)
Neutrophils Relative %: 72.2 % (ref 43.0–77.0)
Platelets: 258 10*3/uL (ref 150.0–400.0)
RBC: 3.9 Mil/uL — ABNORMAL LOW (ref 4.22–5.81)
RDW: 13.3 % (ref 11.5–15.5)
WBC: 5.7 10*3/uL (ref 4.0–10.5)

## 2021-02-07 LAB — PSA: PSA: 2.79 ng/mL (ref 0.10–4.00)

## 2021-02-07 MED ORDER — AMLODIPINE BESYLATE 5 MG PO TABS: ORAL_TABLET | ORAL | 3 refills | Status: DC

## 2021-02-07 MED ORDER — DIAZEPAM 5 MG PO TABS
ORAL_TABLET | ORAL | 5 refills | Status: DC
Start: 2021-02-07 — End: 2021-08-16

## 2021-02-07 NOTE — Patient Instructions (Addendum)
Health Maintenance Due  Topic Date Due   COVID-19 Vaccine (4 - Booster for [employer] series) - Consider getting Omicron/Bivalent booster at your local pharmacy - we do not have this available in our office. Please let us know when you have received this vaccination.  03/14/2020   Please stop by lab before you go If you have mychart- we will send your results within 3 business days of [place] receiving them.  If you do not have mychart- we will call you about results within 5 business days of [place] receiving them.  *please also note that you will see labs on mychart as soon as they post. I will later go in and write notes on them- will say "notes from Dr. Yong Channel"  I am glad to her that your back pain has improved.  Stop taking amlodipine 2.5 mg tablet and continue taking the full 5 mg tablet.  Keep an eye on home blood pressure and let me know if home blood pressure greater than 135/85 on average  Team get a copy of patient's visit from [facility] form this year.  We have not received records yet  Encourage you start getting regular daily exercise into you daily lifestyle - goal is to get at least 150 minutes per week and you may work up to this gradually.  Recommended follow up: Return in about 6 months (around 08/07/2021) for a follow-up or sooner if needed.

## 2021-02-07 NOTE — Progress Notes (Signed)
Phone: [phone]   Subjective:  Patient presents today for their annual physical. Chief complaint-noted.   See problem oriented charting- ROS- full  review of systems was completed and negative per full ROS sheet. He states prior back pain resolved with medrol- still has knee pain   The following were reviewed and entered/updated in epic: Past Medical History:  Diagnosis Date   ANXIETY 11/07/2006   HYPERLIPIDEMIA 07/31/2007   HYPERTENSION 11/07/2006   OSTEOARTHRITIS 11/07/2006   Sleep apnea    hasn't used cpap for "10-12 years"   Patient Active Problem List   Diagnosis Date Noted   Precordial chest pain 06/03/2019    Priority: Medium    Chronic kidney disease (CKD), stage III (moderate) ([place]) 04/03/2019    Priority: Medium    Hyperlipidemia 07/31/2007    Priority: Medium    Insomnia 11/07/2006    Priority: Medium    Essential hypertension 11/07/2006    Priority: Medium    RBBB 04/23/2019    Priority: Low   History of adenomatous polyp of colon 04/13/2016    Priority: Low   Actinic keratosis 08/08/2015    Priority: Low   Plantar fasciitis of left foot 05/16/2011    Priority: Low   Osteoarthritis of right knee 11/07/2006    Priority: Low   Anxiety 04/03/2019   Past Surgical History:  Procedure Laterality Date   CATARACT EXTRACTION     bilateral   HERNIA REPAIR     1999-07-03 Dr Lennie Hummer left groin; right 2010-07-03   KNEE ARTHROSCOPY Right Jul 03, 2006    Family History  Problem Relation Age of Onset   Heart disease Mother        pacer; MI late 38s or early 48s, CABG   Hyperlipidemia Mother    Hypertension Mother    Pneumonia Father        65- didnt bounce back. died 2020-07-02   Other Sister        MVC   Heart attack Brother        age 59, smoker   Colon cancer Neg Hx    Esophageal cancer Neg Hx    Stomach cancer Neg Hx    Rectal cancer Neg Hx     Medications- reviewed and updated Current Outpatient Medications  Medication Sig Dispense Refill   atenolol (TENORMIN) 50 MG  tablet TAKE 1 TABLET BY MOUTH EVERY DAY 90 tablet 1   atorvastatin (LIPITOR) 20 MG tablet TAKE 1 TABLET BY MOUTH EVERY DAY 90 tablet 3   diclofenac Sodium (VOLTAREN) 1 % GEL Apply 4 g topically as needed. 150 g 3   QUEtiapine (SEROQUEL) 25 MG tablet TAKE 1/2 TABLET BY MOUTH AT BEDTIME AS NEEDED FOR SLEEP 45 tablet 3   amLODipine (NORVASC) 5 MG tablet TAKE 1 TABLET BY MOUTH DAILY 90 tablet 3   No current facility-administered medications for this visit.    Allergies-reviewed and updated No Known Allergies  Social History   Social History Narrative   Married 1975-07-03. [place]. [place]. 2 grandkids from [place] 07-03-2003, [place] 2005/07/02.       Retired Tour manager    Objective  Objective:  BP 110/70   Pulse 72   Temp 98.5 F (36.9 C)   Ht 5\' 9"  (1.753 m)   Wt 177 lb (80.3 kg)   SpO2 98%   BMI 26.14 kg/m  Gen: NAD, resting comfortably HEENT: Mucous membranes are moist. Oropharynx normal Neck: no thyromegaly CV: RRR no murmurs rubs or gallops Lungs:  CTAB no crackles, wheeze, rhonchi Abdomen: soft/nontender/nondistended/normal bowel sounds. No rebound or guarding.  Ext: no edema Skin: warm, dry Neuro: grossly normal, moves all extremities, PERRLA    Assessment and Plan  70 y.o. male presenting for annual physical.  Health Maintenance counseling: 1. Anticipatory guidance: Patient counseled regarding regular dental exams -q6 months, eye exams -yearly,  avoiding smoking and second hand smoke , limiting alcohol to 2 beverages per day - no beer in > 50  years, no illicit drugs   2. Risk factor reduction:  Advised patient of need for regular exercise and diet rich and fruits and vegetables to reduce risk of heart attack and stroke. Exercise- limited by knee but trying to stay active. encouraged at least 150 mins per week.  Diet-Patient had some stress last year - tried to lay off sweets and cookies -congratulated him on losing 4 pounds from last year's  physical-had actually lost even more several months ago and has continued to trend down from higher baseline up to 190  Wt Readings from Last 3 Encounters:  02/07/21 177 lb (80.3 kg)  08/01/20 171 lb 9.6 oz (77.8 kg)  05/04/20 172 lb (78 kg)  3. Immunizations/screenings/ancillary studies - discuss Omicron/Bivalent booster-considering at pharmacy - otherwise up-to-date. Immunization History  Administered Date(s) Administered   Fluad Quad(high Dose 65+) 12/11/2018, 12/29/2019   Influenza Split 02/02/2011, 01/15/2012, 12/20/2017   Influenza Whole 12/20/2007, 01/27/2009, 03/01/2010   Influenza, High Dose Seasonal PF 02/09/2016, 12/25/2016, 12/20/2017, 12/30/2020   Influenza,inj,Quad PF,6+ Mos 12/26/2012, 12/31/2013, 01/03/2015   PFIZER(Purple Top)SARS-COV-2 Vaccination 05/01/2019, 05/22/2019, 01/18/2020   Pneumococcal Conjugate-13 02/08/2015   Pneumococcal Polysaccharide-23 12/10/2013   Td 03/26/2000   Tdap 10/10/2011, 08/22/2018   Zoster Recombinat (Shingrix) 08/02/2017, 10/19/2017   Zoster, Live 03/01/2010  4. Prostate cancer screening- past aged based screening recommendations - discontinued screening blood in in May he requested an updated check due to worsening nocturia-PSA has significantly increased-rechecked a month later and had trended down some but not significantly enough-recommended following up with urology Dr. Claudia Desanctis (did not think statin was cause)-he reports ended up with biopsy even though PSA had gotten down to upper 3s on her check. Biopsy with no cancer.  Also has appointment in january Lab Results  Component Value Date   PSA 3.70 08/31/2020   PSA 5.65 (H) 08/01/2020   PSA 1.46 01/24/2018   5. Colon cancer screening - last colonoscopy 04/09/2016 with a 5 year repeat planned 6. Skin cancer screening- follows with Dr. Nevada Crane for dermatology. Advised regular sunscreen use. Denies worrisome, changing, or new skin lesions.  7. Smoking associated screening (lung cancer screening,  AAA screen 65-75, UA)- never smoker 8. STD screening - opts out as monogamous  Status of chronic or acute concerns   # Back pain S:medications: none at present -prior  Flexeril 5 mg at night only  and Medrol dosepak Patient was seen by Erskine Emery, PA-C, on 11/14/2020 for neck and upper back pain. Usually follows with Dr. Ninfa Linden. Patient was sawing logs and felt that he possibly pulled a muscle in between shoulder blades.  - with renal disease patient was told to avoid NSAIDs but did take an Aleve with mild relief - XR Thoracic Spine was ordered and showed no acute fractures or bony abnormalities - disc bas was well-maintained overall.  - XR Cervical Spine was also ordered and showed endplate spurring mid cervical spine with loss of disc space at C4-C5 - no spondylolisthesis or acute fracture. Normal lordotic curvature was noted. -  Patient was given Medrol Dosepak and instructed to avoid NSAIDs while on this medication. Also given Flexeril to take at night only  Today patient reports complete resolution A/P: complete resolution of symptoms will monitor for recurrence   #hypertension/CKD stage III S: medication: amlodipine 7.5 mg (increased january 2020), atenolol 50 mg usually at night Home readings #s: highest has seen at home low 120s over low 80s GFR is typically-in the 50s  BP Readings from Last 3 Encounters:  02/07/21 110/70  08/01/20 120/82  05/04/20 120/82  A/P: For hypertension- excellent control-with weight loss we are going to try off amlodipine 2.5 mg so he will be down to a single and 1.5 mg dose For CKD stage III-hopefully stable-update CMP with labs today  #hyperlipidemia-LDL goal at least under 100. Ideally under 70. S: Medication:Atorvastatin 20 mg daily 4-5 times a week lately Lab Results  Component Value Date   CHOL 107 02/01/2020   HDL 35 (L) 02/01/2020   LDLCALC 54 02/01/2020   LDLDIRECT 79.0 09/11/2018   TRIG 93 02/01/2020   CHOLHDL 3.1 02/01/2020  A/P:   hopefully stable- update lipid panel and continue current meds for now- if high consider going back to daily  #Insomnia S: Medication: Seroquel 12.5 mg, diazepam 5 mg for sleep   Has also been treated for anxiety in the past with buspirone-actually worsened anxiety. This was around time of loss of his mother and near death of father in 06-24-2018. A/P: reasonable control- continue current meds   #Atypical chest pain in late 2018-06-24 -no recurrence. low risk stress test 06/12/2019 with Dr. Ruthann Cancer noted the right bundle branch block makes more difficult to interpret  #Primary osteoarthritis of right knee-worsening renal function on oral NSAIDs in the past. I recommended avoiding NSAIDs. Can use Tylenol arthritis could do knee physical therapy as well (see below bu thtis was helpful)   - has seen Dr. Ninfa Linden ortho since last visit- cortisone shot was not as helpful then went to [facility] for therapy and helped leg some but still having knee pain -gel shot only improved pain for 3 days - not ready for replacement though- will continue to monitor- back at normal activity level  Recommended follow up: No follow-ups on file.  Lab/Order associations: fasting   ICD-10-CM   1. Preventative health care  Z00.00     2. Essential hypertension  I10 CBC with Differential/Platelet    Comprehensive metabolic panel    Lipid panel    3. Stage 3 chronic kidney disease, unspecified whether stage 3a or 3b CKD (HCC)  N18.30     4. Hyperlipidemia, unspecified hyperlipidemia type  E78.5 CBC with Differential/Platelet    Comprehensive metabolic panel    Lipid panel    5. Insomnia, unspecified type  G47.00     6. Elevated PSA  R97.20 PSA      Meds ordered this encounter  Medications   amLODipine (NORVASC) 5 MG tablet    Sig: TAKE 1 TABLET BY MOUTH DAILY    Dispense:  90 tablet    Refill:  3    I,Harris Phan,acting as a scribe for Garret Reddish, MD.,have documented all relevant documentation on the  behalf of Garret Reddish, MD,as directed by  Garret Reddish, MD while in the presence of Garret Reddish, MD.   I, Garret Reddish, MD, have reviewed all documentation for this visit. The documentation on 02/07/21 for the exam, diagnosis, procedures, and orders are all accurate and complete.   Return precautions advised.  Garret Reddish,  MD

## 2021-02-23 ENCOUNTER — Telehealth: Payer: Self-pay | Admitting: Orthopaedic Surgery

## 2021-02-23 NOTE — Telephone Encounter (Signed)
I called and talked to the pt. He wanted to know how often he can get the gel injection. I advised 6 months but pt stated he only received a couple days of relief from the last gel injection. Pt was instructed to come in and discuss next treatment plan with Dr. Ninfa Linden. He stated understanding

## 2021-02-23 NOTE — Telephone Encounter (Signed)
Pt called requesting a call back from [facility]. Or Autumn H. For medical advice concerning gel injection. Please call pt at [phone].

## 2021-03-01 ENCOUNTER — Ambulatory Visit (INDEPENDENT_AMBULATORY_CARE_PROVIDER_SITE_OTHER): Payer: Medicare Other | Admitting: Orthopaedic Surgery

## 2021-03-01 ENCOUNTER — Encounter: Payer: Self-pay | Admitting: Orthopaedic Surgery

## 2021-03-01 DIAGNOSIS — M25561 Pain in right knee: Secondary | ICD-10-CM | POA: Diagnosis not present

## 2021-03-01 DIAGNOSIS — M1711 Unilateral primary osteoarthritis, right knee: Secondary | ICD-10-CM

## 2021-03-01 DIAGNOSIS — G8929 Other chronic pain: Secondary | ICD-10-CM

## 2021-03-01 MED ORDER — LIDOCAINE HCL 1 % IJ SOLN
3.0000 mL | INTRAMUSCULAR | Status: AC | PRN
  Administered 2021-03-01: 3 mL

## 2021-03-01 MED ORDER — METHYLPREDNISOLONE ACETATE 40 MG/ML IJ SUSP
40.0000 mg | INTRAMUSCULAR | Status: AC | PRN
Start: 2021-03-01 — End: 2021-03-01
  Administered 2021-03-01: 40 mg via INTRA_ARTICULAR

## 2021-03-01 NOTE — Progress Notes (Signed)
Office Visit Note   Patient: Nathan Young           Date of Birth: 02-04-1951           MRN: [id number] Visit Date: 03/01/2021              Requested by: Marin Olp, MD [place],  [place] [id number] PCP: Marin Olp, MD   Assessment & Plan: Visit Diagnoses:  1. Chronic pain of right knee   2. Unilateral primary osteoarthritis, right knee     Plan: Per the patient's request I did place a steroid injection in his right knee today without difficulty.  He understands the risks and benefits of steroid injections.  I told him he can safely have these about 3 to 4 months apart.  All questions and concerns were answered and addressed.  Follow-up is as needed.  Follow-Up Instructions: Return if symptoms worsen or fail to improve.   Orders:  Orders Placed This Encounter  Procedures   Large Joint Inj   No orders of the defined types were placed in this encounter.     Procedures: Large Joint Inj: R knee on 03/01/2021 8:46 AM Indications: diagnostic evaluation and pain Details: 22 G 1.5 in needle, superolateral approach  Arthrogram: No  Medications: 3 mL lidocaine 1 %; 40 mg methylPREDNISolone acetate 40 MG/ML Outcome: tolerated well, no immediate complications Procedure, treatment alternatives, risks and benefits explained, specific risks discussed. Consent was given by the patient. Immediately prior to procedure a time out was called to verify the correct patient, procedure, equipment, support staff and site/side marked as required. Patient was prepped and draped in the usual sterile fashion.      Clinical Data: No additional findings.   Subjective: Chief Complaint  Patient presents with   Right Knee - Follow-up  The patient is a 70 year old gentleman well-known to me.  He has known osteoarthritis of his right knee and has been painful to him.  He had a steroid injection 9 months ago on the right knee and then in July he had hyaluronic acid  injection.  He comes in today requesting a steroid injection again for his right knee.  He is not able to take anti-inflammatories now due to chronic kidney insufficiency.  He can take Tylenol arthritis and I told him about that.  His knee does hurt on a daily basis but he is not interested in still knee replacement surgery because it has not gotten the point where it is detrimentally affecting his mobility and his quality of life.  HPI  Review of Systems Today there is no listed fever, chills, nausea, vomiting  Objective: Vital Signs: There were no vitals taken for this visit.  Physical Exam He is alert and orient x3 and in no acute distress Ortho Exam Examination of his right knee shows no effusion.  He has some patellofemoral crepitation and painful range of motion of the knee but there is no instability. Specialty Comments:  No specialty comments available.  Imaging: No results found.   PMFS History: Patient Active Problem List   Diagnosis Date Noted   Precordial chest pain 06/03/2019   RBBB 04/23/2019   Chronic kidney disease (CKD), stage III (moderate) ([place]) 04/03/2019   Anxiety 04/03/2019   History of adenomatous polyp of colon 04/13/2016   Actinic keratosis 08/08/2015   Plantar fasciitis of left foot 05/16/2011   Hyperlipidemia 07/31/2007   Insomnia 11/07/2006   Essential hypertension 11/07/2006   Osteoarthritis  of right knee 11/07/2006   Past Medical History:  Diagnosis Date   ANXIETY 11/07/2006   HYPERLIPIDEMIA 07/31/2007   HYPERTENSION 11/07/2006   OSTEOARTHRITIS 11/07/2006   Sleep apnea    hasn't used cpap for "10-12 years"    Family History  Problem Relation Age of Onset   Heart disease Mother        pacer; MI late 24s or early 16s, CABG   Hyperlipidemia Mother    Hypertension Mother    Pneumonia Father        68- didnt bounce back. died 06/13/2020   Other Sister        MVC   Heart attack Brother        age 18, smoker   Colon cancer Neg Hx    Esophageal  cancer Neg Hx    Stomach cancer Neg Hx    Rectal cancer Neg Hx     Past Surgical History:  Procedure Laterality Date   CATARACT EXTRACTION     bilateral   HERNIA REPAIR     06-14-99 Dr Lennie Hummer left groin; right 06/14/2010   KNEE ARTHROSCOPY Right June 14, 2006   Social History   Occupational History   Occupation: Retired   Tobacco Use   Smoking status: Never   Smokeless tobacco: Never  Substance and Sexual Activity   Alcohol use: No   Drug use: No   Sexual activity: Yes

## 2021-03-06 ENCOUNTER — Telehealth: Payer: Self-pay

## 2021-03-06 NOTE — Telephone Encounter (Signed)
Called and spoke with pt and gave below message and he will mychart or call in blood pressure readings next Monday.

## 2021-03-06 NOTE — Telephone Encounter (Signed)
Patient is calling in requesting a call back.

## 2021-03-06 NOTE — Telephone Encounter (Signed)
Mild blood pressure elevations can sometimes occur after cortisone injections.  Usually these will trend back down with time.  A low-salt diet and regular exercise can be helpful.  Lets have him monitor daily and update me next week with how his readings are doing-if he begins to feel poorly obviously let us know sooner-particular chest pain, shortness of breath, headache should seek care in

## 2021-03-06 NOTE — Telephone Encounter (Signed)
Patient is calling in stating that his blood pressure has been high - 151/95- since receiving the cortisone shot. Wanting to know if this is normal and if so what can he do to control.

## 2021-03-06 NOTE — Telephone Encounter (Signed)
Pt was last seen here 11/15 and cortisone shot on 12/07. F/u ov with you needed to discuss this?

## 2021-03-11 ENCOUNTER — Other Ambulatory Visit: Payer: Self-pay | Admitting: Family Medicine

## 2021-03-14 ENCOUNTER — Telehealth: Payer: Self-pay

## 2021-03-14 NOTE — Telephone Encounter (Signed)
FYI

## 2021-03-14 NOTE — Telephone Encounter (Signed)
Patient states he was to call Dr. Yong Channel back to inform him of how he has been feeling since he had a Cortizone injection a week ago.  Patient states he is doing well.

## 2021-03-14 NOTE — Telephone Encounter (Signed)
Perfect thanks!  Glad he is better

## 2021-04-08 ENCOUNTER — Other Ambulatory Visit: Payer: Self-pay | Admitting: Family Medicine

## 2021-04-19 ENCOUNTER — Telehealth: Payer: Self-pay

## 2021-04-19 NOTE — Telephone Encounter (Signed)
Patient has called back in regard.   I have given patient Dr. Ansel Bong response.  Patient states he understood.

## 2021-04-19 NOTE — Telephone Encounter (Signed)
Is scheduled to receive COVID booster and held off because his blood pressure is 135/85 and wanted to know if it is okay to get his booster. He is concerned that it may increase it.

## 2021-04-19 NOTE — Telephone Encounter (Signed)
Reasonable to proceed with booster-may have short-term increase but should come back down with time  Can take an extra half tablet of amlodipine if blood pressure over 150

## 2021-04-19 NOTE — Telephone Encounter (Signed)
Noted  

## 2021-04-20 ENCOUNTER — Encounter: Payer: Self-pay | Admitting: Gastroenterology

## 2021-04-20 NOTE — Telephone Encounter (Signed)
Yes he can still receive the bivalent booster

## 2021-04-20 NOTE — Telephone Encounter (Signed)
Pt states his blood pressure has come down but his is not feeling well. He is feeling anxious. He wants to know if he should continue Dr Ansel Bong original advice.

## 2021-04-20 NOTE — Telephone Encounter (Signed)
Patient states he feels better was 124/85 a little bit ago.  He just wanted to let us know, I advised him this is a good reading and would let Dr. Yong Channel know. He states he is just going to check B/P once a day now   FYI only

## 2021-04-21 NOTE — Telephone Encounter (Signed)
Left detailed VM.  

## 2021-04-28 ENCOUNTER — Ambulatory Visit (INDEPENDENT_AMBULATORY_CARE_PROVIDER_SITE_OTHER): Payer: Medicare Other | Admitting: Family Medicine

## 2021-04-28 ENCOUNTER — Encounter: Payer: Self-pay | Admitting: Family Medicine

## 2021-04-28 ENCOUNTER — Other Ambulatory Visit: Payer: Self-pay

## 2021-04-28 VITALS — BP 110/82 | HR 69 | Temp 98.3°F | Ht 69.0 in | Wt 177.8 lb

## 2021-04-28 DIAGNOSIS — E785 Hyperlipidemia, unspecified: Secondary | ICD-10-CM

## 2021-04-28 DIAGNOSIS — I10 Essential (primary) hypertension: Secondary | ICD-10-CM

## 2021-04-28 LAB — COMPREHENSIVE METABOLIC PANEL
ALT: 12 U/L (ref 0–53)
AST: 17 U/L (ref 0–37)
Albumin: 4.5 g/dL (ref 3.5–5.2)
Alkaline Phosphatase: 46 U/L (ref 39–117)
BUN: 19 mg/dL (ref 6–23)
CO2: 30 mEq/L (ref 19–32)
Calcium: 9.7 mg/dL (ref 8.4–10.5)
Chloride: 104 mEq/L (ref 96–112)
Creatinine, Ser: 1.54 mg/dL — ABNORMAL HIGH (ref 0.40–1.50)
GFR: 44.79 mL/min — ABNORMAL LOW (ref >60.00)
Glucose, Bld: 89 mg/dL (ref 70–99)
Potassium: 4.7 mEq/L (ref 3.5–5.1)
Sodium: 139 mEq/L (ref 135–145)
Total Bilirubin: 0.8 mg/dL (ref 0.2–1.2)
Total Protein: 7.1 g/dL (ref 6.0–8.3)

## 2021-04-28 LAB — CBC WITH DIFFERENTIAL/PLATELET
Basophils Absolute: 0.1 10*3/uL (ref 0.0–0.1)
Basophils Relative: 1 % (ref 0.0–3.0)
Eosinophils Absolute: 0.1 10*3/uL (ref 0.0–0.7)
Eosinophils Relative: 2.3 % (ref 0.0–5.0)
HCT: 41.7 % (ref 39.0–52.0)
Hemoglobin: 14.1 g/dL (ref 13.0–17.0)
Lymphocytes Relative: 18.7 % (ref 12.0–46.0)
Lymphs Abs: 1.2 10*3/uL (ref 0.7–4.0)
MCHC: 33.9 g/dL (ref 30.0–36.0)
MCV: 97.6 fl (ref 78.0–100.0)
Monocytes Absolute: 0.5 10*3/uL (ref 0.1–1.0)
Monocytes Relative: 7.4 % (ref 3.0–12.0)
Neutro Abs: 4.5 10*3/uL (ref 1.4–7.7)
Neutrophils Relative %: 70.6 % (ref 43.0–77.0)
Platelets: 253 10*3/uL (ref 150.0–400.0)
RBC: 4.27 Mil/uL (ref 4.22–5.81)
RDW: 12.9 % (ref 11.5–15.5)
WBC: 6.4 10*3/uL (ref 4.0–10.5)

## 2021-04-28 NOTE — Progress Notes (Signed)
Phone [phone] In person visit   Subjective:   Nathan Young is a 71 y.o. year old very pleasant male patient who presents for/with See problem oriented charting Chief Complaint  Patient presents with   Follow-up   Hypertension    This visit occurred during the SARS-CoV-2 public health emergency.  Safety protocols were in place, including screening questions prior to the visit, additional usage of staff PPE, and extensive cleaning of exam room while observing appropriate contact time as indicated for disinfecting solutions.   Past Medical History-  Patient Active Problem List   Diagnosis Date Noted   Precordial chest pain 06/03/2019    Priority: Medium    Chronic kidney disease (CKD), stage III (moderate) ([place]) 04/03/2019    Priority: Medium    Hyperlipidemia 07/31/2007    Priority: Medium    Insomnia 11/07/2006    Priority: Medium    Essential hypertension 11/07/2006    Priority: Medium    RBBB 04/23/2019    Priority: Low   History of adenomatous polyp of colon 04/13/2016    Priority: Low   Actinic keratosis 08/08/2015    Priority: Low   Plantar fasciitis of left foot 05/16/2011    Priority: Low   Osteoarthritis of right knee 11/07/2006    Priority: Low   Anxiety 04/03/2019    Medications- reviewed and updated Current Outpatient Medications  Medication Sig Dispense Refill   amLODipine (NORVASC) 5 MG tablet TAKE 1 TABLET BY MOUTH DAILY 90 tablet 3   atenolol (TENORMIN) 50 MG tablet TAKE 1 TABLET BY MOUTH EVERY DAY 90 tablet 1   atorvastatin (LIPITOR) 20 MG tablet TAKE 1 TABLET BY MOUTH EVERY DAY 90 tablet 3   diazepam (VALIUM) 5 MG tablet TAKE 1/2 TO 1 TABLET BY MOUTH EVERY DAY AS NEEDED FOR SLEEP 30 tablet 5   diclofenac Sodium (VOLTAREN) 1 % GEL Apply 4 g topically as needed. 150 g 3   QUEtiapine (SEROQUEL) 25 MG tablet TAKE 1/2 TABLET BY MOUTH AT BEDTIME AS NEEDED FOR SLEEP 45 tablet 3   No current facility-administered medications for this visit.      Objective:  BP 110/82    Pulse 69    Temp 98.3 F (36.8 C)    Ht 5\' 9"  (1.753 m)    Wt 177 lb 12.8 oz (80.6 kg)    SpO2 98%    BMI 26.26 kg/m  Gen: NAD, resting comfortably CV: RRR no murmurs rubs or gallops Lungs: CTAB no crackles, wheeze, rhonchi Abdomen: soft/nontender/nondistended/normal bowel sounds. No rebound or guarding.  Ext: no edema Skin: warm, dry    Assessment and Plan   #hypertension/CKD stage III S: medication:  amlodipine 5 mg ( use to be 7.5 mg which was increased january 2020), atenolol 50mg  daily  GFR is typically-in the 50s-slightly low on last check and had some mild anemia as well  Home readings #s: over 16 last home readings  avg 120.4375 83.5625  No lightheadedness or dizziness BP Readings from Last 3 Encounters:  04/28/21 110/82  02/07/21 110/70  08/01/20 120/82  A/P: Hypertension is well controlled-continue current medication.  We reviewed home readings even though he has a couple readings that are slightly high overall blood pressures well controlled especially when averaging his numbers out  Hopefully CKD stage III stable-update CMP with labs today - Plan from last labs for was for him to come back for another CBC-we will check that today-also encouraged him to schedule colonoscopy since stool cards did not  be completed-he agrees to call to schedule  #hyperlipidemia-LDL goal at least under 100. Ideally under 70. S: Medication:Atorvastatin 20 mg every day Lab Results  Component Value Date   CHOL 108 02/07/2021   HDL 34.50 (L) 02/07/2021   LDLCALC 59 02/07/2021   LDLDIRECT 79.0 09/11/2018   TRIG 71.0 02/07/2021   CHOLHDL 3 02/07/2021   A/P: Excellent control last check-continue current medication  Health Maintenance Due  Topic Date Due   COLONOSCOPY   [facility] contact Please call to schedule visit and/or procedure Address: [address], [place], [place] [address] Phone: [phone]  04/09/2021   Recommended follow up: Return for next  already scheduled visit. Future Appointments  Date Time Provider [place]  08/09/2021 11:00 AM Marin Olp, MD [facility]  02/09/2022  9:40 AM Yong Channel Brayton Mars, MD [facility]    Lab/Order associations:   ICD-10-CM   1. Essential hypertension  I10 Comprehensive metabolic panel    CBC with Differential/Platelet    2. Hyperlipidemia, unspecified hyperlipidemia type  E78.5       No orders of the defined types were placed in this encounter.   I,Jada Bradford,acting as a scribe for Garret Reddish, MD.,have documented all relevant documentation on the behalf of Garret Reddish, MD,as directed by  Garret Reddish, MD while in the presence of Garret Reddish, MD.   I, Garret Reddish, MD, have reviewed all documentation for this visit. The documentation on 04/28/21 for the exam, diagnosis, procedures, and orders are all accurate and complete.   Return precautions advised.  Garret Reddish, MD

## 2021-04-28 NOTE — Patient Instructions (Addendum)
Health Maintenance Due  Topic Date Due   COLONOSCOPY   [facility] contact Please call to schedule visit and/or procedure Address: [address], [place], [place] [address] Phone: [phone]  04/09/2021   Please stop by lab before you go If you have mychart- we will send your results within 3 business days of [place] receiving them.  If you do not have mychart- we will call you about results within 5 business days of [place] receiving them.  *please also note that you will see labs on mychart as soon as they post. I will later go in and write notes on them- will say "notes from Dr. Yong Channel"  We are rechecking prior anemia  Overall blood pressure looks pretty good with average over last 16 readings 120/83.5- continue current meds. If you want if BP gets above 145/90- can take extra half tablet of amlodipine but also ok to wait an hour or so and recheck and take only if stays high  Recommended follow up: Return for next already scheduled visit.

## 2021-05-10 ENCOUNTER — Encounter: Payer: Self-pay | Admitting: Gastroenterology

## 2021-06-07 ENCOUNTER — Encounter: Payer: Self-pay | Admitting: Gastroenterology

## 2021-06-07 ENCOUNTER — Ambulatory Visit (AMBULATORY_SURGERY_CENTER): Payer: Medicare Other | Admitting: *Deleted

## 2021-06-07 ENCOUNTER — Telehealth: Payer: Self-pay | Admitting: Family Medicine

## 2021-06-07 ENCOUNTER — Other Ambulatory Visit: Payer: Self-pay

## 2021-06-07 VITALS — Ht 69.0 in | Wt 177.0 lb

## 2021-06-07 DIAGNOSIS — Z86010 Personal history of colonic polyps: Secondary | ICD-10-CM

## 2021-06-07 MED ORDER — NA SULFATE-K SULFATE-MG SULF 17.5-3.13-1.6 GM/177ML PO SOLN: 1.0000 | ORAL | 0 refills | Status: DC

## 2021-06-07 NOTE — Telephone Encounter (Signed)
Pt states BP has been around 130/85 to 135/90. I advised an appt but pt was reluctant. He is asking for Dr Ansel Bong advice. Please advise ?

## 2021-06-07 NOTE — Progress Notes (Signed)

## 2021-06-08 NOTE — Telephone Encounter (Signed)
Hopping home average <135/85- perhaps he can write them out for 2 weeks then drop off for [place] to average- or take a picture and send in- last average in February looked well controlled ?

## 2021-06-08 NOTE — Telephone Encounter (Signed)
See below, last OV was 02/03. ?

## 2021-06-08 NOTE — Telephone Encounter (Signed)
Full message from Dr Yong Channel was transmitted to the patient. Patient fully understands. Patient is aware he should call our office should he have further questions. Patient agrees.  ?

## 2021-06-08 NOTE — Telephone Encounter (Signed)
Called and lm  for pt tcb. When pt cb please give below message. ?

## 2021-06-15 ENCOUNTER — Ambulatory Visit (INDEPENDENT_AMBULATORY_CARE_PROVIDER_SITE_OTHER): Payer: Medicare Other | Admitting: Family Medicine

## 2021-06-15 ENCOUNTER — Encounter: Payer: Self-pay | Admitting: Family Medicine

## 2021-06-15 ENCOUNTER — Telehealth: Payer: Self-pay | Admitting: Family Medicine

## 2021-06-15 VITALS — BP 140/80 | HR 105 | Temp 98.3°F | Ht 69.0 in | Wt 180.4 lb

## 2021-06-15 DIAGNOSIS — R002 Palpitations: Secondary | ICD-10-CM | POA: Diagnosis not present

## 2021-06-15 LAB — TSH: TSH: 2.43 u[IU]/mL (ref 0.35–5.50)

## 2021-06-15 LAB — BASIC METABOLIC PANEL
BUN: 15 mg/dL (ref 6–23)
CO2: 28 mEq/L (ref 19–32)
Calcium: 9.9 mg/dL (ref 8.4–10.5)
Chloride: 104 mEq/L (ref 96–112)
Creatinine, Ser: 1.43 mg/dL (ref 0.40–1.50)
GFR: 48.92 mL/min — ABNORMAL LOW (ref >60.00)
Glucose, Bld: 105 mg/dL — ABNORMAL HIGH (ref 70–99)
Potassium: 4.6 mEq/L (ref 3.5–5.1)
Sodium: 140 mEq/L (ref 135–145)

## 2021-06-15 LAB — MAGNESIUM: Magnesium: 1.7 mg/dL (ref 1.5–2.5)

## 2021-06-15 NOTE — Patient Instructions (Signed)
It was very nice to see you today! ? ?Take deep breaths.  NO CAFFEINE.   Try atenolol 1/2 tab twice daily.  Log events.  I am ordering heart monitor, echocardiogram. ? ? ?PLEASE NOTE: ? ?If you had any lab tests please let us know if you have not heard back within a few days. You may see your results on MyChart before we have a chance to review them but we will give you a call once they are reviewed by [place]. If we ordered any referrals today, please let us know if you have not heard from their office within the next week.  ? ?Please try these tips to maintain a healthy lifestyle: ? ?Eat most of your calories during the day when you are active. Eliminate processed foods including packaged sweets (pies, cakes, cookies), reduce intake of potatoes, white bread, white pasta, and white rice. Look for whole grain options, oat flour or almond flour. ? ?Each meal should contain half fruits/vegetables, one quarter protein, and one quarter carbs (no bigger than a computer mouse). ? ?Cut down on sweet beverages. This includes juice, soda, and sweet tea. Also watch fruit intake, though this is a healthier sweet option, it still contains natural sugar! Limit to 3 servings daily. ? ?Drink at least 1 glass of water with each meal and aim for at least 8 glasses per day ? ?Exercise at least 150 minutes every week.   ?

## 2021-06-15 NOTE — Telephone Encounter (Signed)
Pt has an appt at 10am with Dr Cherlynn Kaiser today ? ?Patient ?Name: ?Nathan Young ?Gender: Male ?DOB: April 16, 1950 ?Age: 71 Y 39 M 13 D ?Return ?Phone ?Number: ?[phone] ?(Primary), ?[id number] ?(Secondary) ?Address: ?City/ ?State/ ?Zip: [address] [place] ? [address] ?Client [place] [facility] [facility] - ?Client ?Presenter, broadcasting at [place] [facility] ?Provider Garret Reddish- MD ?Contact Type Call ?Who Is Calling Patient / Member / Family / Caregiver ?Call Type Triage / Clinical ?Relationship To Patient Self ?Return Phone Number [phone] (Primary) ?Chief Complaint Heart palpitations or irregular heartbeat ?Reason for Call Symptomatic / Request for Health Information ?Initial Comment Caller states that his pulse is racing. His blood ?pressure is 109/80. ?Translation No ?Nurse Assessment ?Nurse: Roselie Awkward, RN, Heather Date/Time (Eastern Time): 06/15/2021 8:21:56 AM ?Confirm and document reason for call. If ?symptomatic, describe symptoms. ---Caller states his BP is 109/80 and HR is 112 ?Does the patient have any new or worsening ?symptoms? ---Yes ?Will a triage be completed? ---Yes ?Related visit to physician within the last 2 weeks? ---No ?Does the PT have any chronic conditions? (i.e. ?diabetes, asthma, this includes High risk factors for ?pregnancy, etc.) ?---No ?Is this a behavioral health or substance abuse call? ---No ?Guidelines ?Guideline Title Affirmed Question Affirmed Notes Nurse Date/Time (Eastern ?Time) ?Heart Rate and ?Heartbeat Questions ?Age > 60 years ?(Exception: brief ?heartbeat symptoms ?that went away and ?now feels well) ?Roselie Awkward, RN, Heather 06/15/2021 8:24:07 ?AM ?Disp. Time (Eastern ?Time) Disposition Final User ?06/15/2021 8:31:56 AM See HCP within 4 Hours (or ?PCP triage) ?Yes Roselie Awkward, RN, Nira Conn ?Caller Disagree/Comply Comply ?Caller Understands Yes ?PreDisposition Call Doctor ?Care Advice Given Per Guideline ?SEE HCP (OR PCP TRIAGE) WITHIN 4 HOURS: * IF OFFICE WILL BE  OPEN: You need to be seen within the next 3 or 4 ?hours. Call your doctor (or NP/PA) now or as soon as the office opens. CALL BACK IF: * You become worse CARE ADVICE ?given per Heart Rate and Heartbeat Questions (Adult) guideline. ?Comments ?User: Mauri Pole, RN Date/Time Eilene Ghazi Time): 06/15/2021 8:24:04 AM ?does take BP medication ?User: Mauri Pole, RN Date/Time Eilene Ghazi Time): 06/15/2021 8:37:42 AM ?caller transferred to office for appt at 10:00 this am ?Referrals ?REFERRED TO PCP OFFICE ?Warm transfer to backline ?

## 2021-06-15 NOTE — Telephone Encounter (Signed)
FYI

## 2021-06-15 NOTE — Progress Notes (Signed)
? ?Subjective:  ? ? ? Patient ID: Nathan Young, male    DOB: 1950/08/28, 71 y.o.   MRN: [id number] ? ?Chief Complaint  ?Patient presents with  ? Hypertension  ?  Blood pressure up and down, pulse has been up and down  ?Would like to discuss possible change in medications  ? ? ?HPI-here w/wife ? HTN-on amlodipine and atenolol-bp's and pulse up and down. Past 1.5 yrs, bp's flaring up.  Has been on atenolol for yrs-faster heart rate.  Then 1.5 yrs or so, placed on amlodipine.  Checks bp's daily past 2-3 wks.  Bp's weren't ideal when Saw Dr. Yong Channel on 2/3.  Heart rate elevated intermitt.  Feels in chest and makes him aware/anxious but no cp/dizziness/sob/dizziness.  Notices more "when moving around".  HR 123 this am.  Bp running ok.  146/92 P122 this am.   122/79 P71 last pm.  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  More when not pre-occupied.  Can go few days w/o issues and other times,once/d.   Yesterday, had salty bisquits. ?Had cortisone shot in R knee earlier this yr-HR and bp were up for 1 wk.  ? ?Bp's 108-150/70-90.  P60-80's, but then pulse up higher at times.  ? ?Health Maintenance Due  ?Topic Date Due  ? COLONOSCOPY (Pts 45-55yr Insurance coverage will need to be confirmed)  04/09/2021  ? ? ?Past Medical History:  ?Diagnosis Date  ? ANXIETY 11/07/2006  ? HYPERLIPIDEMIA 07/31/2007  ? HYPERTENSION 11/07/2006  ? OSTEOARTHRITIS 11/07/2006  ? Sleep apnea   ? hasn't used cpap for "10-12 years"  ? ? ?Past Surgical History:  ?Procedure Laterality Date  ? CATARACT EXTRACTION    ? bilateral  ? COLONOSCOPY  04/09/2016  ? Dr.Stark-polyp  ? HERNIA REPAIR    ? 2001 Dr TLennie Hummerleft groin; right 2012  ? KNEE ARTHROSCOPY Right 2008  ? POLYPECTOMY    ? ? ?Outpatient Medications Prior to Visit  ?Medication Sig Dispense Refill  ? amLODipine (NORVASC) 5 MG tablet TAKE 1 TABLET BY MOUTH DAILY 90 tablet 3  ? atenolol (TENORMIN) 50 MG tablet TAKE 1 TABLET BY MOUTH EVERY  DAY 90 tablet 1  ? atorvastatin (LIPITOR) 20 MG tablet TAKE 1 TABLET BY MOUTH EVERY DAY 90 tablet 3  ? diazepam (VALIUM) 5 MG tablet TAKE 1/2 TO 1 TABLET BY MOUTH EVERY DAY AS NEEDED FOR SLEEP 30 tablet 5  ? diclofenac Sodium (VOLTAREN) 1 % GEL Apply 4 g topically as needed. 150 g 3  ? Na Sulfate-K Sulfate-Mg Sulf 17.5-3.13-1.6 GM/177ML SOLN Take 1 kit by mouth as directed. May use generic Suprep 354 mL 0  ? QUEtiapine (SEROQUEL) 25 MG tablet TAKE 1/2 TABLET BY MOUTH AT BEDTIME AS NEEDED FOR SLEEP 45 tablet 3  ? ?No facility-administered medications prior to visit.  ? ? ?No Known Allergies ?ROS neg/noncontributory except as noted HPI/below ?No drugs, no n/v/d/heartburn/no dizziness. ?Caffeine mt dew-up to 2/day. [facility] caffeine free. ? ? ?   ?Objective:  ?  ? ?BP 140/80   Pulse (!) 105   Temp 98.3 ?F (36.8 ?C) (Temporal)   Ht 5' 9"  (1.753 m)   Wt 180 lb 6 oz (81.8 kg)   SpO2 99%   BMI 26.64 kg/m?  ?Wt Readings from Last 3 Encounters:  ?06/15/21 180 lb 6 oz (81.8 kg)  ?06/07/21 177 lb (80.3 kg)  ?04/28/21 177 lb 12.8 oz (80.6 kg)  ? ? ?  Physical Exam  ? ?Gen: WDWN NAD WM ?HEENT: NCAT, conjunctiva not injected, sclera nonicteric ?NECK:  supple, no thyromegaly, no nodes, no carotid bruits ?CARDIAC: tachyRRR, S1S2+, no murmur. DP 2+B ?LUNGS: CTAB. No wheezes ?ABDOMEN:  BS+, soft, NTND, No HSM, no masses ?EXT:  no edema ?MSK: no gross abnormalities.  ?NEURO: A&O x3.  CN II-XII intact.  ?PSYCH: normal mood. Good eye contact ? ? ?  06/15/2021  ?  9:52 AM 06/07/2021  ? 10:34 AM 04/28/2021  ? 11:15 AM 02/07/2021  ? 10:38 AM 08/01/2020  ? 11:16 AM 05/04/2020  ?  4:12 PM  ?Vitals with BMI  ?Height 5' 9"  5' 9"  5' 9"  5' 9"  5' 9"  5' 9"   ?Weight 180 lbs 6 oz 177 lbs 177 lbs 13 oz 177 lbs 171 lbs 10 oz 172 lbs  ?BMI 26.62 26.13 26.24 26.13 25.33 25.39  ?Systolic 888  916 945 038 120  ?Diastolic 80  82 70 82 82  ?Pulse 105  69 72 66 76  ?  ?UEK:CMKL 96.  Sinus.  No change from prev.  RBBB.  Had stress test 2021. ?   ?Assessment & Plan:   ? ?Problem List Items Addressed This Visit   ?None ?Visit Diagnoses   ? ? Heart palpitations    -  Primary  ? Relevant Orders  ? EKG 12-Lead  ? ?  ? Palpitations-rapid HR.  Bp's mostly controlled.  ?anxiety, ?paroxsymal a fib, other.  Will check TSH, BMP,Mg. Zio and echo.  Stop caffeine.  Take deep breaths. Cough when occurs.  Try atenolol 1/2 bid.   ? ?No orders of the defined types were placed in this encounter. ? ? ?Wellington Hampshire, MD ? ?

## 2021-06-16 ENCOUNTER — Telehealth: Payer: Self-pay | Admitting: Family Medicine

## 2021-06-16 NOTE — Telephone Encounter (Signed)
Please call at the [phone] number ?

## 2021-06-16 NOTE — Telephone Encounter (Signed)
Returned call to patient. Gave lab results, patient verbalized understanding.  

## 2021-06-21 ENCOUNTER — Encounter: Payer: Self-pay | Admitting: Gastroenterology

## 2021-06-21 ENCOUNTER — Ambulatory Visit (AMBULATORY_SURGERY_CENTER): Payer: Medicare Other | Admitting: Gastroenterology

## 2021-06-21 ENCOUNTER — Other Ambulatory Visit: Payer: Self-pay

## 2021-06-21 VITALS — BP 95/71 | HR 64 | Temp 98.0°F | Resp 13 | Ht 69.0 in | Wt 177.0 lb

## 2021-06-21 DIAGNOSIS — D123 Benign neoplasm of transverse colon: Secondary | ICD-10-CM

## 2021-06-21 DIAGNOSIS — Z86010 Personal history of colonic polyps: Secondary | ICD-10-CM | POA: Diagnosis not present

## 2021-06-21 MED ORDER — SODIUM CHLORIDE 0.9 % IV SOLN: 500.0000 mL | Freq: Once | INTRAVENOUS | Status: DC

## 2021-06-21 NOTE — Progress Notes (Signed)
Pt's states no medical or surgical changes since previsit or office visit. VS by DT. °

## 2021-06-21 NOTE — Progress Notes (Signed)
See 06/15/2020 H&P, no changes ?

## 2021-06-21 NOTE — Progress Notes (Signed)
Called to room to assist during endoscopic procedure.  Patient ID and intended procedure confirmed with present staff. Received instructions for my participation in the procedure from the performing physician.  

## 2021-06-21 NOTE — Patient Instructions (Addendum)
Handouts on hemorrhoids, diverticulosis, and polyps given to patient. ?Await pathology results. ?Resume previous diet and continue present medications ?High fiber diet recommended. ? ? ?YOU HAD AN ENDOSCOPIC PROCEDURE TODAY AT [facility]:   Refer to the procedure report that was given to you for any specific questions about what was found during the examination.  If the procedure report does not answer your questions, please call your gastroenterologist to clarify.  If you requested that your care partner not be given the details of your procedure findings, then the procedure report has been included in a sealed envelope for you to review at your convenience later. ? ?YOU SHOULD EXPECT: Some feelings of bloating in the abdomen. Passage of more gas than usual.  Walking can help get rid of the air that was put into your GI tract during the procedure and reduce the bloating. If you had a lower endoscopy (such as a colonoscopy or flexible sigmoidoscopy) you may notice spotting of blood in your stool or on the toilet paper. If you underwent a bowel prep for your procedure, you may not have a normal bowel movement for a few days. ? ?Please Note:  You might notice some irritation and congestion in your nose or some drainage.  This is from the oxygen used during your procedure.  There is no need for concern and it should clear up in a day or so. ? ?SYMPTOMS TO REPORT IMMEDIATELY: ? ?Following lower endoscopy (colonoscopy or flexible sigmoidoscopy): ? Excessive amounts of blood in the stool ? Significant tenderness or worsening of abdominal pains ? Swelling of the abdomen that is new, acute ? Fever of 100?F or higher ? ?For urgent or emergent issues, a gastroenterologist can be reached at any hour by calling [phone]. ?Do not use MyChart messaging for urgent concerns.  ? ? ?DIET:  We do recommend a small meal at first, but then you may proceed to your regular diet.  Drink plenty of fluids but you  should avoid alcoholic beverages for 24 hours. ? ?ACTIVITY:  You should plan to take it easy for the rest of today and you should NOT DRIVE or use heavy machinery until tomorrow (because of the sedation medicines used during the test).   ? ?FOLLOW UP: ?Our staff will call the number listed on your records 48-72 hours following your procedure to check on you and address any questions or concerns that you may have regarding the information given to you following your procedure. If we do not reach you, we will leave a message.  We will attempt to reach you two times.  During this call, we will ask if you have developed any symptoms of COVID 19. If you develop any symptoms (ie: fever, flu-like symptoms, shortness of breath, cough etc.) before then, please call [phone].  If you test positive for Covid 19 in the 2 weeks post procedure, please call and report this information to [place].   ? ?If any biopsies were taken you will be contacted by phone or by letter within the next 1-3 weeks.  Please call us at [phone] if you have not heard about the biopsies in 3 weeks.  ? ? ?SIGNATURES/CONFIDENTIALITY: ?You and/or your care partner have signed paperwork which will be entered into your electronic medical record.  These signatures attest to the fact that that the information above on your After Visit Summary has been reviewed and is understood.  Full responsibility of the confidentiality of this discharge information lies  with you and/or your care-partner.  ?

## 2021-06-21 NOTE — Op Note (Signed)
[place] ?Patient Name: Nathan Young ?Procedure Date: 06/21/2021 10:55 AM ?MRN: [id number] ?Endoscopist: Ladene Artist , MD ?Age: 71 ?Referring MD:  ?Date of Birth: 1951-01-07 ?Gender: Male ?Account #: [id number] ?Procedure:                Colonoscopy ?Indications:              Surveillance: Personal history of adenomatous  ?                          polyps on last colonoscopy 5 years ago ?Medicines:                Monitored Anesthesia Care ?Procedure:                Pre-Anesthesia Assessment: ?                          - Prior to the procedure, a History and Physical  ?                          was performed, and patient medications and  ?                          allergies were reviewed. The patient's tolerance of  ?                          previous anesthesia was also reviewed. The risks  ?                          and benefits of the procedure and the sedation  ?                          options and risks were discussed with the patient.  ?                          All questions were answered, and informed consent  ?                          was obtained. Prior Anticoagulants: The patient has  ?                          taken no previous anticoagulant or antiplatelet  ?                          agents. ASA Grade Assessment: III - A patient with  ?                          severe systemic disease. After reviewing the risks  ?                          and benefits, the patient was deemed in  ?                          satisfactory condition to undergo the procedure. ?  After obtaining informed consent, the colonoscope  ?                          was passed under direct vision. Throughout the  ?                          procedure, the patient's blood pressure, pulse, and  ?                          oxygen saturations were monitored continuously. The  ?                          Olympus Scope [id number] was introduced through the  ?                          anus and advanced to  the the cecum, identified by  ?                          appendiceal orifice and ileocecal valve. The  ?                          ileocecal valve, appendiceal orifice, and rectum  ?                          were photographed. The quality of the bowel  ?                          preparation was adequate after extensive lavage,  ?                          suction. The colonoscopy was performed without  ?                          difficulty. The patient tolerated the procedure  ?                          well. ?Scope In: 11:52:06 AM ?Scope Out: 99:83:38 PM ?Scope Withdrawal Time: 0 hours 14 minutes 23 seconds  ?Total Procedure Duration: 0 hours 17 minutes 12 seconds  ?Findings:                 The perianal and digital rectal examinations were  ?                          normal. ?                          Two sessile polyps were found in the transverse  ?                          colon. The polyps were 6 to 8 mm in size. These  ?                          polyps were removed with a cold snare. Resection  ?  and retrieval were complete. ?                          A few medium-mouthed diverticula were found in the  ?                          transverse colon. There was no evidence of  ?                          diverticular bleeding. ?                          Multiple medium-mouthed diverticula were found in  ?                          the left colon. There was narrowing of the colon in  ?                          association with the diverticular opening. There  ?                          was evidence of diverticular spasm. There was no  ?                          evidence of diverticular bleeding. ?                          Internal hemorrhoids were found during  ?                          retroflexion. The hemorrhoids were mild and small. ?                          The exam was otherwise without abnormality on  ?                          direct and retroflexion views. ?Complications:             No immediate complications. Estimated blood loss:  ?                          None. ?Estimated Blood Loss:     Estimated blood loss: none. ?Impression:               - Two 6 to 8 mm polyps in the transverse colon,  ?                          removed with a cold snare. Resected and retrieved. ?                          - Mild diverticulosis in the transverse colon. ?                          - Moderate diverticulosis in the left colon. ?                          -  Internal hemorrhoids. ?                          - The examination was otherwise normal on direct  ?                          and retroflexion views. ?Recommendation:           - Patient has a contact number available for  ?                          emergencies. The signs and symptoms of potential  ?                          delayed complications were discussed with the  ?                          patient. Return to normal activities tomorrow.  ?                          Written discharge instructions were provided to the  ?                          patient. ?                          - High fiber diet. ?                          - Continue present medications. ?                          - Await pathology results. ?                          - No repeat colonoscopy due to age. ?Ladene Artist, MD ?06/21/2021 12:20:42 PM ?This report has been signed electronically. ?

## 2021-06-23 ENCOUNTER — Telehealth: Payer: Self-pay | Admitting: *Deleted

## 2021-06-23 NOTE — Telephone Encounter (Signed)
?  Follow up Call- ? ? ?  06/21/2021  ? 11:04 AM  ?Call back number  ?Post procedure Call Back phone  # [phone]  ?Permission to leave phone message Yes  ?  ? ?Patient questions: ? ?Do you have a fever, pain , or abdominal swelling? No. ?Pain Score  0 * ? ?Have you tolerated food without any problems? Yes.   ? ?Have you been able to return to your normal activities? Yes.   ? ?Do you have any questions about your discharge instructions: ?Diet   No. ?Medications  No. ?Follow up visit  No. ? ?Do you have questions or concerns about your Care? No. ? ?Actions: ?* If pain score is 4 or above: ?No action needed, pain <4. ? ? ? ? ?

## 2021-06-23 NOTE — Telephone Encounter (Signed)
?  Follow up Call- ? ? ?  06/21/2021  ? 11:04 AM  ?Call back number  ?Post procedure Call Back phone  # [phone]  ?Permission to leave phone message Yes  ?  ? ?Patient questions: ? ?Do you have a fever, pain , or abdominal swelling? No. ?Pain Score  0 * ? ?Have you tolerated food without any problems? Yes.   ? ?Have you been able to return to your normal activities? Yes.   ? ?Do you have any questions about your discharge instructions: ?Diet   No. ?Medications  No. ?Follow up visit  No. ? ?Do you have questions or concerns about your Care? No. ? ?Actions: ?* If pain score is 4 or above: ?No action needed, pain <4. ? ? ?

## 2021-07-03 ENCOUNTER — Other Ambulatory Visit: Payer: Self-pay | Admitting: Family Medicine

## 2021-07-10 ENCOUNTER — Encounter: Payer: Self-pay | Admitting: Gastroenterology

## 2021-07-28 ENCOUNTER — Ambulatory Visit (INDEPENDENT_AMBULATORY_CARE_PROVIDER_SITE_OTHER): Payer: Medicare Other

## 2021-07-28 DIAGNOSIS — R002 Palpitations: Secondary | ICD-10-CM | POA: Diagnosis not present

## 2021-07-28 LAB — ECHOCARDIOGRAM COMPLETE
AR max vel: 2.05 cm2
AV Area VTI: 1.72 cm2
AV Area mean vel: 1.84 cm2
AV Mean grad: 6 mmHg
AV Peak grad: 11.8 mmHg
Ao pk vel: 1.72 m/s
Area-P 1/2: 2.76 cm2
Calc EF: 70.2 %
Single Plane A2C EF: 73.8 %
Single Plane A4C EF: 64.4 %

## 2021-07-31 ENCOUNTER — Telehealth: Payer: Self-pay | Admitting: *Deleted

## 2021-07-31 NOTE — Telephone Encounter (Signed)
Patient returned call about echo results. Patient notified of results. Patient stated that blood pressures and pulse have been okay. Patient stated that he has an upcoming follow-up with Dr. Yong Channel and would discuss any concerns with him then. ?

## 2021-08-09 ENCOUNTER — Encounter: Payer: Self-pay | Admitting: Family Medicine

## 2021-08-09 ENCOUNTER — Ambulatory Visit (INDEPENDENT_AMBULATORY_CARE_PROVIDER_SITE_OTHER): Payer: Medicare Other | Admitting: Family Medicine

## 2021-08-09 VITALS — BP 108/64 | HR 61 | Temp 98.2°F | Ht 69.0 in | Wt 176.0 lb

## 2021-08-09 DIAGNOSIS — N183 Chronic kidney disease, stage 3 unspecified: Secondary | ICD-10-CM

## 2021-08-09 DIAGNOSIS — E785 Hyperlipidemia, unspecified: Secondary | ICD-10-CM

## 2021-08-09 DIAGNOSIS — I10 Essential (primary) hypertension: Secondary | ICD-10-CM | POA: Diagnosis not present

## 2021-08-09 NOTE — Patient Instructions (Addendum)
Team please add omicron booster bivalent shot on 04/29/21 ? ?No changes today- thrilled you are doing better- follow up if you have new or worsening symptoms ? ?Recommended follow up: Return for next already scheduled visit or sooner if needed. November physical ?

## 2021-08-09 NOTE — Progress Notes (Signed)
?Phone [phone] ?In person visit ?  ?Subjective:  ? ?Nathan Young is a 71 y.o. year old very pleasant male patient who presents for/with See problem oriented charting ?Chief Complaint  ?Patient presents with  ? Follow-up  ? Hypertension  ? ? ?Past Medical History-  ?Patient Active Problem List  ? Diagnosis Date Noted  ? Precordial chest pain 06/03/2019  ?  Priority: Medium   ? Chronic kidney disease (CKD), stage III (moderate) ([place]) 04/03/2019  ?  Priority: Medium   ? Hyperlipidemia 07/31/2007  ?  Priority: Medium   ? Insomnia 11/07/2006  ?  Priority: Medium   ? Essential hypertension 11/07/2006  ?  Priority: Medium   ? RBBB 04/23/2019  ?  Priority: Low  ? History of adenomatous polyp of colon 04/13/2016  ?  Priority: Low  ? Actinic keratosis 08/08/2015  ?  Priority: Low  ? Plantar fasciitis of left foot 05/16/2011  ?  Priority: Low  ? Osteoarthritis of right knee 11/07/2006  ?  Priority: Low  ? Anxiety 04/03/2019  ? ? ?Medications- reviewed and updated ?Current Outpatient Medications  ?Medication Sig Dispense Refill  ? amLODipine (NORVASC) 5 MG tablet TAKE 1 TABLET BY MOUTH DAILY 90 tablet 3  ? atenolol (TENORMIN) 50 MG tablet TAKE 1 TABLET BY MOUTH EVERY DAY 90 tablet 1  ? atorvastatin (LIPITOR) 20 MG tablet TAKE 1 TABLET BY MOUTH EVERY DAY 90 tablet 3  ? diazepam (VALIUM) 5 MG tablet TAKE 1/2 TO 1 TABLET BY MOUTH EVERY DAY AS NEEDED FOR SLEEP 30 tablet 5  ? diclofenac Sodium (VOLTAREN) 1 % GEL Apply 4 g topically as needed. 150 g 3  ? QUEtiapine (SEROQUEL) 25 MG tablet TAKE 1/2 TABLET BY MOUTH AT BEDTIME AS NEEDED FOR SLEEP 45 tablet 3  ? ?No current facility-administered medications for this visit.  ? ?  ?Objective:  ?BP 108/64   Pulse 61   Temp 98.2 ?F (36.8 ?C)   Ht '5\' 9"'$  (1.753 m)   Wt 176 lb (79.8 kg)   SpO2 97%   BMI 25.99 kg/m?  ?Gen: NAD, resting comfortably ?CV: RRR no murmurs rubs or gallops ?Lungs: CTAB no crackles, wheeze, rhonchi ?Ext: no edema ?Skin: warm, dry ?  ? ?Assessment and Plan   ? ?#Palpitations ?S: Seen by Dr. Cherlynn Kaiser on 06/15/2021 "Palpitations-rapid HR.  Bp's mostly controlled.  ?anxiety, ?paroxsymal a fib, other.  Will check TSH, BMP,Mg. Zio and echo.  Stop caffeine.  Take deep breaths. Cough when occurs.  Try atenolol 1/2 bid". Tsh normal ? ?-Echocardiogram 07/28/2021 reassuring ?-ZIO monitor not yet obtained- no further palpitations or high heart rate  ? ?He tried to eat better, cut down on caffeine and salt.  ?A/P: no recurrence since that day- offered to do ZIO monitor- but he declined  ? ?#hypertension/CKD stage III ?S: medication:  amlodipine '5mg'$ , atenolol '50mg'$ - takes half in AM and half in PM ?Home readings #s: 110s-120s/70s with peak mid 80s ? ?GFR is typically-in the 40ss including recently 06/15/21 ?BP Readings from Last 3 Encounters:  ?08/09/21 108/64  ?06/21/21 95/71  ?06/15/21 140/80  ?A/P: ?For hypertension-well-controlled-no lightheadedness with lower normal blood pressure-we opted to continue current medication ?For CKD stage III-kidney function is stable-continue to monitor at least every 6 months ? ?#hyperlipidemia-LDL goal at least under 100.  Ideally under 70. ?S: Medication:Atorvastatin 20 mg ?Lab Results  ?Component Value Date  ? CHOL 108 02/07/2021  ? HDL 34.50 (L) 02/07/2021  ? [place] 59 02/07/2021  ? LDLDIRECT  79.0 09/11/2018  ? TRIG 71.0 02/07/2021  ? CHOLHDL 3 02/07/2021  ?A/P: He has been at ideal goal-continue current medication with LDL under 70 ?  ?#Insomnia ?S: Medication: Seroquel 12.5 mg, diazepam 5 mg for sleep  ?A/P: reasonably stable- refill as needed.   ? ?#Primary osteoarthritis of right knee-worsening renal function on oral NSAIDs in the past.  I recommended avoiding NSAIDs.  Has seen Dr. Ninfa Linden. PT helped some. 2nd steroid injection did help but HR high/palpitations ?-intermittent issues lately ? ?Recommended follow up: Return for next already scheduled visit or sooner if needed. ?Future Appointments  ?Date Time Provider [place]   ?02/09/2022  9:40 AM Yong Channel, Brayton Mars, MD [facility]  ? ?Lab/Order associations: ?  ICD-10-CM   ?1. Essential hypertension  I10   ?  ?2. Stage 3 chronic kidney disease, unspecified whether stage 3a or 3b CKD ([place])  N18.30   ?  ?3. Hyperlipidemia, unspecified hyperlipidemia type  E78.5   ?  ? ? ?No orders of the defined types were placed in this encounter. ? ? ?Return precautions advised.  ?Garret Reddish, MD ? ?

## 2021-08-16 ENCOUNTER — Other Ambulatory Visit: Payer: Self-pay | Admitting: Family Medicine

## 2021-09-19 ENCOUNTER — Other Ambulatory Visit: Payer: Self-pay | Admitting: Family Medicine

## 2021-10-01 ENCOUNTER — Other Ambulatory Visit: Payer: Self-pay | Admitting: Family Medicine

## 2021-12-18 ENCOUNTER — Telehealth: Payer: Self-pay | Admitting: Family Medicine

## 2021-12-18 ENCOUNTER — Encounter: Payer: Self-pay | Admitting: *Deleted

## 2021-12-18 NOTE — Telephone Encounter (Signed)
See below

## 2021-12-18 NOTE — Telephone Encounter (Signed)
Pt states: -Instructed to let PCP team know if BP changed -Friday evening 09/22 came in from outside and didn't feel well. -Variable readings, for two days - - -09/22 180/101 - - -09/23 160/100 - - -09/23 139/95 -BP came down on Sunday 09/24 - - -09/24 127/29 - - -09/25 112/84  -He "laid off of the [place]" on Saturday and Sunday. -Took Statin 09/25 am   Pt requests: -Call back for instructions.    Pt scheduled for OV 12/26/21, if needed for follow up.

## 2021-12-18 NOTE — Telephone Encounter (Signed)
It appears blood pressures are trending back down-unclear what caused variation but agree reasonable to discuss at next visit unless he sees another substantial spike.  Traditionally cholesterol medicines do not raise blood pressure though.

## 2021-12-19 NOTE — Telephone Encounter (Signed)
Called and spoke with pt and below message given. Pt will take more current readings for his OV on 10/03.

## 2021-12-22 ENCOUNTER — Encounter (HOSPITAL_BASED_OUTPATIENT_CLINIC_OR_DEPARTMENT_OTHER): Payer: Self-pay

## 2021-12-22 ENCOUNTER — Emergency Department (HOSPITAL_BASED_OUTPATIENT_CLINIC_OR_DEPARTMENT_OTHER)
Admission: EM | Admit: 2021-12-22 | Discharge: 2021-12-22 | Disposition: A | Discharge status: Home / Self Care | Payer: Medicare Other | Attending: Emergency Medicine | Admitting: Emergency Medicine

## 2021-12-22 ENCOUNTER — Other Ambulatory Visit: Payer: Self-pay

## 2021-12-22 ENCOUNTER — Emergency Department (HOSPITAL_BASED_OUTPATIENT_CLINIC_OR_DEPARTMENT_OTHER): Payer: Medicare Other | Admitting: Radiology

## 2021-12-22 DIAGNOSIS — I10 Essential (primary) hypertension: Secondary | ICD-10-CM | POA: Insufficient documentation

## 2021-12-22 DIAGNOSIS — Z79899 Other long term (current) drug therapy: Secondary | ICD-10-CM | POA: Insufficient documentation

## 2021-12-22 DIAGNOSIS — I4510 Unspecified right bundle-branch block: Secondary | ICD-10-CM

## 2021-12-22 DIAGNOSIS — R079 Chest pain, unspecified: Secondary | ICD-10-CM

## 2021-12-22 DIAGNOSIS — R002 Palpitations: Secondary | ICD-10-CM | POA: Insufficient documentation

## 2021-12-22 LAB — CBC
HCT: 42.8 % (ref 39.0–52.0)
Hemoglobin: 15.1 g/dL (ref 13.0–17.0)
MCH: 33.9 pg (ref 26.0–34.0)
MCHC: 35.3 g/dL (ref 30.0–36.0)
MCV: 96 fL (ref 80.0–100.0)
Platelets: 265 10*3/uL (ref 150–400)
RBC: 4.46 MIL/uL (ref 4.22–5.81)
RDW: 12.9 % (ref 11.5–15.5)
WBC: 7.8 10*3/uL (ref 4.0–10.5)
nRBC: 0 % (ref 0.0–0.2)

## 2021-12-22 LAB — BASIC METABOLIC PANEL
Anion gap: 13 (ref 5–15)
BUN: 25 mg/dL — ABNORMAL HIGH (ref 8–23)
CO2: 28 mmol/L (ref 22–32)
Calcium: 10 mg/dL (ref 8.9–10.3)
Chloride: 97 mmol/L — ABNORMAL LOW (ref 98–111)
Creatinine, Ser: 1.07 mg/dL (ref 0.61–1.24)
GFR, Estimated: >60 mL/min (ref >60)
Glucose, Bld: 99 mg/dL (ref 70–99)
Potassium: 4.4 mmol/L (ref 3.5–5.1)
Sodium: 138 mmol/L (ref 135–145)

## 2021-12-22 LAB — TROPONIN I (HIGH SENSITIVITY)
Troponin I (High Sensitivity): <2 ng/L (ref <18)
Troponin I (High Sensitivity): <2 ng/L (ref <18)

## 2021-12-22 NOTE — Telephone Encounter (Signed)
He is in ED per triage recommendations- not a lot of qualifying features to ascertain potential cause of pain in note unfortunately- we will await ED information

## 2021-12-22 NOTE — Discharge Instructions (Addendum)
You were seen here for evaluation of your chest pain. Your labs and imaging were unremarkable. Your EKG was unchanged. I have sent in an ambulatory referral to the cardiologist.  They should reach out to you in the next few days, if you not heard from them, please call to schedule an appointment.  Additionally, would like for you to keep your scheduled with your PCP on Tuesday.  Please follow-up with them and discussed doing a possible cardiac monitor as it looks like they recommended this to you in the previous years.  If you have any chest pain, shortness of breath, lightheadedness, dizziness, nausea, vomiting, sweatiness, palpitations, syncope/fainting please return the nearest emergency department for reevaluation. If you have any concerns, new or worsening symptoms, please return to the nearest emergency department for evaluation.  Contact a doctor if: Your chest pain does not go away. You feel depressed. You have a fever. Get help right away if: Your chest pain is worse. You have a cough that gets worse, or you cough up blood. You have very bad (severe) pain in your belly (abdomen). You pass out (faint). You have either of these for no clear reason: Sudden chest discomfort. Sudden discomfort in your arms, back, neck, or jaw. You have shortness of breath at any time. You suddenly start to sweat, or your skin gets clammy. You feel sick to your stomach (nauseous). You throw up (vomit). You suddenly feel lightheaded or dizzy. You feel very weak or tired. Your heart starts to beat fast, or it feels like it is skipping beats. These symptoms may be an emergency. Do not wait to see if the symptoms will go away. Get medical help right away. Call your local emergency services (911 in the [place] Do not drive yourself to the hospital.

## 2021-12-22 NOTE — Telephone Encounter (Signed)
FYI

## 2021-12-22 NOTE — ED Provider Notes (Signed)
[facility] EMERGENCY DEPT Provider Note   CSN: [id number] Arrival date & time: 12/22/21  0945     History Chief Complaint  Patient presents with  . Chest Pain    Nathan Young is a 71 y.o. male.   Chest Pain      Home Medications Prior to Admission medications   Medication Sig Start Date End Date Taking? Authorizing Provider  amLODipine (NORVASC) 5 MG tablet TAKE 1 TABLET BY MOUTH DAILY 02/07/21   Marin Olp, MD  atenolol (TENORMIN) 50 MG tablet TAKE 1 TABLET BY MOUTH EVERY DAY 09/19/21   Marin Olp, MD  atorvastatin (LIPITOR) 20 MG tablet TAKE 1 TABLET BY MOUTH EVERY DAY 07/03/21   Marin Olp, MD  diazepam (VALIUM) 5 MG tablet TAKE 1/2 TO 1 TABLET BY MOUTH EVERY DAY AS NEEDED FOR SLEEP 08/16/21   Marin Olp, MD  diclofenac Sodium (VOLTAREN) 1 % GEL Apply 4 g topically as needed. 04/14/20   Marin Olp, MD  QUEtiapine (SEROQUEL) 25 MG tablet TAKE 1/2 TABLET BY MOUTH AT BEDTIME AS NEEDED FOR SLEEP 04/10/21   Marin Olp, MD      Allergies    Patient has no known allergies.    Review of Systems   Review of Systems  Cardiovascular:  Positive for chest pain.    Physical Exam Updated Vital Signs BP 105/86   Pulse 71   Temp 98.4 F (36.9 C) (Oral)   Resp (!) 21   SpO2 98%  Physical Exam  ED Results / Procedures / Treatments   Labs (all labs ordered are listed, but only abnormal results are displayed) Labs Reviewed  BASIC METABOLIC PANEL - Abnormal; Notable for the following components:      Result Value   Chloride 97 (*)    BUN 25 (*)    All other components within normal limits  CBC  TROPONIN I (HIGH SENSITIVITY)  TROPONIN I (HIGH SENSITIVITY)    EKG EKG Interpretation  Date/Time:  Friday December 22 2021 09:53:59 EDT Ventricular Rate:  92 PR Interval:  200 QRS Duration: 118 QT Interval:  378 QTC Calculation: 467 R Axis:   104 Text Interpretation: Normal sinus rhythm Right bundle branch block  Abnormal ECG When compared with ECG of 22-Dec-2021 09:52, No significant change since last tracing Confirmed by Gareth Morgan [phone]) on 12/22/2021 11:28:33 AM  Radiology DG Chest 2 View  Result Date: 12/22/2021 CLINICAL DATA:  Chest pain EXAM: CHEST - 2 VIEW COMPARISON:  03/26/2019 FINDINGS: The heart size and mediastinal contours are within normal limits. Both lungs are clear. The visualized skeletal structures are unremarkable. IMPRESSION: No acute abnormality of the lungs. Electronically Signed   By: Delanna Ahmadi M.D.   On: 12/22/2021 10:26    Procedures Procedures  {Document cardiac monitor, telemetry assessment procedure when appropriate:1}  Medications Ordered in ED Medications - No data to display  ED Course/ Medical Decision Making/ A&P                           Medical Decision Making Amount and/or Complexity of Data Reviewed Labs: ordered. Radiology: ordered.   ***  {Document critical care time when appropriate:1} {Document review of labs and clinical decision tools ie heart score, Chads2Vasc2 etc:1}  {Document your independent review of radiology images, and any outside records:1} {Document your discussion with family members, caretakers, and with consultants:1} {Document social determinants of health affecting pt's care:1} {Document your  decision making why or why not admission, treatments were needed:1} Final Clinical Impression(s) / ED Diagnoses Final diagnoses:  None    Rx / DC Orders ED Discharge Orders     None

## 2021-12-22 NOTE — Telephone Encounter (Addendum)
Pt states: -09/29 AM BP was 128/81, so normal but thinks it will continue to fluctuate over the weekend. -he is still not feeling well, specifically anxious and woozy.  -he is experiencing heart rate palpitations.  Pt declined OV with a [place] Office Pt requested to speak to triage nurse: Warm Transferred to Willette Cluster, patient coordinator with [facility]

## 2021-12-22 NOTE — ED Triage Notes (Signed)
Pt presents POV from home  Pt reports his BP has been "shooting up and down" taking Atenolol and Amlodipine. BP as high 181/101 Pt reports his BP was okay yesterday but he still felt something was "off"   Pt has 2 sheets of BP readings  Pt is unable to see his PCP until next Tuesday.  Pt reports intermittent Left side chest pain, reports "it sometimes moves around". Pt denies arm/neck/back pain, N/V/D

## 2021-12-22 NOTE — Telephone Encounter (Signed)
Patient Name: Nathan Young Gender: Male DOB: 09/17/1950 Age: 71 Y 1 M 19 D Return Phone Number: [phone] (Primary), [phone] (Secondary) Address: City/ State/ Zip: [place]  [address] Client [facility] Provider Garret Reddish- MD Contact Type Call Who Is Calling Patient / Member / Family / Caregiver Call Type Triage / Clinical Relationship To Patient Self Return Phone Number [phone] (Primary) Chief Complaint BLOOD PRESSURE LOW - Systolic (top number) 90 or less Reason for Call Symptomatic / Request for Health Information Initial Comment Patient blood pressure is 128/81 this morning and fluctuating. Has chest pressure when he moves around this am. Translation No Nurse Assessment Nurse: Thad Ranger, RN, Langley Gauss Date/Time (Eastern Time): 12/22/2021 8:42:06 AM Confirm and document reason for call. If symptomatic, describe symptoms. ---Patient blood pressure is 128/81 this morning and fluctuating. Has chest pressure when he moves around this am. Does the patient have any new or worsening symptoms? ---Yes Will a triage be completed? ---Yes Related visit to physician within the last 2 weeks? ---No Does the PT have any chronic conditions? (i.e. diabetes, asthma, this includes High risk factors for pregnancy, etc.) ---Yes List chronic conditions. ---HTN Is this a behavioral health or substance abuse call? ---No Guidelines Guideline Title Affirmed Question Affirmed Notes Nurse Date/Time (Eastern Time) Chest Pain [1] Chest pain lasts > 5 minutes AND [2] described as crushing, pressure-like, or heavy Carmon, RN, Denise 12/22/2021 8:42:51 AM   Disp. Time Eilene Ghazi Time) Disposition Final User 12/22/2021 8:37:59 AM Send to Urgent Daron Offer, [place] 12/22/2021 8:46:17 AM Call EMS 911 Now Yes Thad Ranger, RN, Langley Gauss Final Disposition 12/22/2021 8:46:17 AM Call EMS 911  Now Yes Carmon, RN, Yevette Edwards Disagree/Comply Disagree Caller Understands Yes PreDisposition Call Doctor Care Advice Given Per Guideline CALL EMS 911 NOW: CARE ADVICE given per Chest Pain (Adult) guideline. Referrals GO TO FACILITY REFUSED

## 2021-12-26 ENCOUNTER — Ambulatory Visit (INDEPENDENT_AMBULATORY_CARE_PROVIDER_SITE_OTHER): Payer: Medicare Other | Admitting: Family Medicine

## 2021-12-26 ENCOUNTER — Encounter: Payer: Self-pay | Admitting: Family Medicine

## 2021-12-26 VITALS — BP 124/86 | HR 71 | Temp 98.6°F | Ht 69.0 in | Wt 176.0 lb

## 2021-12-26 DIAGNOSIS — R079 Chest pain, unspecified: Secondary | ICD-10-CM | POA: Diagnosis not present

## 2021-12-26 DIAGNOSIS — E785 Hyperlipidemia, unspecified: Secondary | ICD-10-CM

## 2021-12-26 DIAGNOSIS — R002 Palpitations: Secondary | ICD-10-CM

## 2021-12-26 DIAGNOSIS — I10 Essential (primary) hypertension: Secondary | ICD-10-CM

## 2021-12-26 NOTE — Progress Notes (Signed)
Phone [phone] In person visit   Subjective:   Nathan Young is a 71 y.o. year old very pleasant male patient who presents for/with See problem oriented charting Chief Complaint  Patient presents with   Follow-up    Follow- up after ER visit for chest pain   Past Medical History-  Patient Active Problem List   Diagnosis Date Noted   Precordial chest pain 06/03/2019    Priority: Medium    Chronic kidney disease (CKD), stage III (moderate) ([place]) 04/03/2019    Priority: Medium    Hyperlipidemia 07/31/2007    Priority: Medium    Insomnia 11/07/2006    Priority: Medium    Essential hypertension 11/07/2006    Priority: Medium    RBBB 04/23/2019    Priority: Low   History of adenomatous polyp of colon 04/13/2016    Priority: Low   Actinic keratosis 08/08/2015    Priority: Low   Plantar fasciitis of left foot 05/16/2011    Priority: Low   Osteoarthritis of right knee 11/07/2006    Priority: Low   Anxiety 04/03/2019    Medications- reviewed and updated Current Outpatient Medications  Medication Sig Dispense Refill   amLODipine (NORVASC) 5 MG tablet TAKE 1 TABLET BY MOUTH DAILY 90 tablet 3   atenolol (TENORMIN) 50 MG tablet TAKE 1 TABLET BY MOUTH EVERY DAY 90 tablet 1   atorvastatin (LIPITOR) 20 MG tablet TAKE 1 TABLET BY MOUTH EVERY DAY 90 tablet 3   diazepam (VALIUM) 5 MG tablet TAKE 1/2 TO 1 TABLET BY MOUTH EVERY DAY AS NEEDED FOR SLEEP 30 tablet 5   diclofenac Sodium (VOLTAREN) 1 % GEL Apply 4 g topically as needed. 150 g 3   QUEtiapine (SEROQUEL) 25 MG tablet TAKE 1/2 TABLET BY MOUTH AT BEDTIME AS NEEDED FOR SLEEP 45 tablet 3   No current facility-administered medications for this visit.     Objective:  BP 124/86   Pulse 71   Temp 98.6 F (37 C)   Ht '5\' 9"'$  (1.753 m)   Wt 176 lb (79.8 kg)   SpO2 99%   BMI 25.99 kg/m  Gen: NAD, resting comfortably CV: RRR no murmurs rubs or gallops Some left lateral chest wall pain Lungs: CTAB no crackles, wheeze,  rhonchi Abdomen: soft/nontender/nondistended/normal bowel sounds. No rebound or guarding.  Ext: no edema Skin: warm, dry     Assessment and Plan   # chest pain #Palpitations-not associated with the chest pain S:central upper chest discomfort started about two weeks. Had noted blood pressure spikes about a week prior while out working- [facility] machine showed BP 181/101 with pulse 102- waited 15 mins 148/92 and pulse 99. Pulse stayed up most of that day as high as 102 with BP that night being 160/100 - on sept 22. Went into emergency room a week prior due to recurrent upper chest discomfort - rates as mild. No left arm or shoulder pain. Has had lingering mild pain in left mid chest since that time but has reduced from day of visit. Home blood pressures have improved. Occasional palpitations but not with the pain- maybe once a month. No shortness of breath or lightheadedness  Looking back to visit 08/09/21 had  "#Palpitations S: Seen by Dr. Cherlynn Kaiser on 06/15/2021 "Palpitations-rapid HR.  Bp's mostly controlled.  ?anxiety, ?paroxsymal a fib, other.  Will check TSH, BMP,Mg. Zio and echo.  Stop caffeine.  Take deep breaths. Cough when occurs.  Try atenolol 1/2 bid". Tsh normal   -Echocardiogram 07/28/2021 reassuring -  ZIO monitor not yet obtained- no further palpitations or high heart rate    He tried to eat better, cut down on caffeine and salt.  A/P: no recurrence since that day- offered to do ZIO monitor- but he declined "  Hr 135 steroid shot - we had a call in with reported blood pressure at that time but he is very firm today that it was heart rate elevations.  No sensation of heart racing at times despite being on atenolol A/P: 71 year old man with chest pain that started after being active and noted to have blood pressure very elevated that time-reassuring work-up in the emergency department with largely stable EKG with right bundle branch block and troponin trend low risk.  He is already scheduled  follow-up with Dr. Percival Spanish and I think is an appropriate next step (does have some left lateral chest wall pain which I suspect is musculoskeletal but previously with central chest pain after coming in from activity in the yard)-in the past he did have a exercise stress test in 2021 with no obvious ischemia but more difficult to interpret due to right bundle branch block.  Had recent echocardiogram in May which was largely reassuring  Prior echocardiogram was done due to palpitations and plan was for cardiac monitoring which patient opted out of at that time-with ongoing issues and heart rate as high as 135 within a day of steroid shot-he is interested in discussing potential cardiac monitoring with cardiology in light of intermittent palpitations   #hypertension/CKD stage III S: medication:  amlodipine '5mg'$ , atenolol '50mg'$ - half tablet twice daily  GFR is typically-in the 50s  A/P: For hypertension-blood pressure reasonably well controlled today-continue current medication-monitoring at home and encouraged him to hold off on checks unless he has rested for 5 minutes as well as some other parameters on counseling for rechecking blood pressure For CKD stage III-actually on most recent check creatinine had improved to 1.07 in the hospital with GFR slightly above 60 but traditionally GFR has been in the 40s or 50s-recheck next visit  #hyperlipidemia-LDL goal at least under 100.  Ideally under 70. S: Medication:Atorvastatin 20 mg Lab Results  Component Value Date   CHOL 108 02/07/2021   HDL 34.50 (L) 02/07/2021   LDLCALC 59 02/07/2021   LDLDIRECT 79.0 09/11/2018   TRIG 71.0 02/07/2021   CHOLHDL 3 02/07/2021    A/P: Cholesterol has been traditionally well controlled-continue current medications and update with next blood work   Recommended follow up: Return for next already scheduled visit or sooner if needed. Future Appointments  Date Time Provider [place]  12/29/2021 10:00 AM  Minus Breeding, MD [facility] None  02/09/2022 10:00 AM Yong Channel Brayton Mars, MD [facility]    Lab/Order associations:   ICD-10-CM   1. Chest pain, unspecified type  R07.9     2. Palpitations  R00.2     3. Essential hypertension  I10     4. Hyperlipidemia, unspecified hyperlipidemia type  E78.5       Return precautions advised.  Garret Reddish, MD

## 2021-12-26 NOTE — Patient Instructions (Addendum)
Lets keep cardiology appointment  Recommended follow up: Return for next already scheduled visit or sooner if needed. -seek care if new or worsening symptoms

## 2021-12-28 DIAGNOSIS — R002 Palpitations: Secondary | ICD-10-CM | POA: Insufficient documentation

## 2021-12-28 NOTE — Progress Notes (Signed)
Cardiology Office Note   Date:  12/29/2021   ID:  Nathan Young, DOB 26-Sep-1950, MRN [id number]  PCP:  Marin Olp, MD  Cardiologist:   None Referring:  Marin Olp, MD   Chief Complaint  Patient presents with   Chest Pain     History of Present Illness: Nathan Young is a 71 y.o. male who is referred by Marin Olp, MD for evaluation of chest pain.  I saw him last in 2021 for this.   He had a negative POET (Plain Old Exercise Treadmill).   He had an echo in May with NL LV function.  He had no significant valvular disease. He was in the ED in late Sept for this.   I reviewed these records for this visit.  There was no objective evidence of ischemia.  He describes some on and off fleeting chest discomfort and some fluttering in his chest.  He has not had any more that since then.  He is active doing a lot of stuff around his property.  He might occasionally have a fluttering in his chest but he really does not notice chest pressure, neck or arm discomfort.  He has no new shortness of breath, PND or orthopnea.  None of his symptoms he thinks are changed since his 2021 stress test.  Of note he started taking his atenolol twice daily instead of once daily splitting the pill and he had improvement in any palpitations that he was having.   Past Medical History:  Diagnosis Date   ANXIETY 11/07/2006   HYPERLIPIDEMIA 07/31/2007   HYPERTENSION 11/07/2006   OSTEOARTHRITIS 11/07/2006   Sleep apnea    hasn't used cpap for "10-12 years"    Past Surgical History:  Procedure Laterality Date   CATARACT EXTRACTION     bilateral   COLONOSCOPY  04/09/2016   Dr.Stark-polyp   HERNIA REPAIR     2001 Dr Lennie Hummer left groin; right 2012   KNEE ARTHROSCOPY Right 2008   POLYPECTOMY       Current Outpatient Medications  Medication Sig Dispense Refill   amLODipine (NORVASC) 5 MG tablet TAKE 1 TABLET BY MOUTH DAILY 90 tablet 3   atenolol (TENORMIN) 50 MG tablet TAKE 1 TABLET BY  MOUTH EVERY DAY (Patient taking differently: Take 25 mg by mouth 2 (two) times daily.) 90 tablet 1   atorvastatin (LIPITOR) 20 MG tablet TAKE 1 TABLET BY MOUTH EVERY DAY 90 tablet 3   diazepam (VALIUM) 5 MG tablet TAKE 1/2 TO 1 TABLET BY MOUTH EVERY DAY AS NEEDED FOR SLEEP 30 tablet 5   diclofenac Sodium (VOLTAREN) 1 % GEL Apply 4 g topically as needed. 150 g 3   QUEtiapine (SEROQUEL) 25 MG tablet TAKE 1/2 TABLET BY MOUTH AT BEDTIME AS NEEDED FOR SLEEP 45 tablet 3   No current facility-administered medications for this visit.    Allergies:   Patient has no known allergies.    ROS:  Please see the history of present illness.   Otherwise, review of systems are positive for none.   All other systems are reviewed and negative.    PHYSICAL EXAM: VS:  BP 130/82 (BP Location: Left Arm, Patient Position: Sitting)   Pulse 76   Ht '5\' 9"'$  (1.753 m)   Wt 178 lb (80.7 kg)   SpO2 97%   BMI 26.29 kg/m  , BMI Body mass index is 26.29 kg/m. GENERAL:  Well appearing NECK:  No jugular venous  distention, waveform within normal limits, carotid upstroke brisk and symmetric, no bruits, no thyromegaly LUNGS:  Clear to auscultation bilaterally CHEST:  Unremarkable HEART:  PMI not displaced or sustained,S1 and S2 within normal limits, no S3, no S4, no clicks, no rubs, no murmurs ABD:  Flat, positive bowel sounds normal in frequency in pitch, no bruits, no rebound, no guarding, no midline pulsatile mass, no hepatomegaly, no splenomegaly EXT:  2 plus pulses throughout, no edema, no cyanosis no clubbing  EKG:  EKG is not ordered today. The ekg ordered today demonstrates sinus rhythm, rate 92, right bundle branch block, no acute ST-T wave changes.  12/22/2021   Recent Labs: 04/28/2021: ALT 12 06/15/2021: Magnesium 1.7; TSH 2.43 12/22/2021: BUN 25; Creatinine, Ser 1.07; Hemoglobin 15.1; Platelets 265; Potassium 4.4; Sodium 138    Lipid Panel    Component Value Date/Time   CHOL 108 02/07/2021 1124   TRIG  71.0 02/07/2021 1124   TRIG 84 02/27/2006 0828   HDL 34.50 (L) 02/07/2021 1124   CHOLHDL 3 02/07/2021 1124   VLDL 14.2 02/07/2021 1124   LDLCALC 59 02/07/2021 1124   LDLCALC 54 02/01/2020 1423   LDLDIRECT 79.0 09/11/2018 1329      Wt Readings from Last 3 Encounters:  12/29/21 178 lb (80.7 kg)  12/26/21 176 lb (79.8 kg)  08/09/21 176 lb (79.8 kg)      Other studies Reviewed: Additional studies/ records that were reviewed today include: ED records. Review of the above records demonstrates:  Please see elsewhere in the note.     ASSESSMENT AND PLAN:  CHEST PAIN: His chest pain had nonanginal greater than anginal features and there was no objective evidence of ischemia in the emergency room.  He is no longer having this.  No further testing is indicated.  PALPITATIONS:   These have improved as he is clipped the beta-blocker.  He will continue with this.   HTN:    His blood pressure well controlled.  I reviewed her blood pressure diary.  No change in therapy.  RBBB:    This has been chronic.  No change in therapy.  DYSLIPIDEMIA:    LDL was 59 with an HDL of 34.5.  No change in therapy.  CKD: I did see that his creatinine was down to 1.07.  No change in therapy.    Current medicines are reviewed at length with the patient today.  The patient does not have concerns regarding medicines.  The following changes have been made:    Labs/ tests ordered today include:  None  No orders of the defined types were placed in this encounter.    Disposition:   FU with me as needed.    Signed, Minus Breeding, MD  12/29/2021 11:31 AM    [place]

## 2021-12-29 ENCOUNTER — Encounter: Payer: Self-pay | Admitting: Cardiology

## 2021-12-29 ENCOUNTER — Ambulatory Visit: Payer: Medicare Other | Attending: Cardiology | Admitting: Cardiology

## 2021-12-29 VITALS — BP 130/82 | HR 76 | Ht 69.0 in | Wt 178.0 lb

## 2021-12-29 DIAGNOSIS — E785 Hyperlipidemia, unspecified: Secondary | ICD-10-CM | POA: Diagnosis not present

## 2021-12-29 DIAGNOSIS — I10 Essential (primary) hypertension: Secondary | ICD-10-CM | POA: Diagnosis not present

## 2021-12-29 DIAGNOSIS — I4510 Unspecified right bundle-branch block: Secondary | ICD-10-CM | POA: Diagnosis not present

## 2021-12-29 DIAGNOSIS — R002 Palpitations: Secondary | ICD-10-CM

## 2021-12-29 NOTE — Patient Instructions (Signed)
    Follow-Up: At [facility], you and your health needs are our priority.  As part of our continuing mission to provide you with exceptional heart care, we have created designated Provider Care Teams.  These Care Teams include your primary Cardiologist (physician) and Advanced Practice Providers (APPs -  Physician Assistants and Nurse Practitioners) who all work together to provide you with the care you need, when you need it.  We recommend signing up for the patient portal called "MyChart".  Sign up information is provided on this After Visit Summary.  MyChart is used to connect with patients for Virtual Visits (Telemedicine).  Patients are able to view lab/test results, encounter notes, upcoming appointments, etc.  Non-urgent messages can be sent to your provider as well.   To learn more about what you can do with MyChart, go to [email or web address].    Your next appointment:   As needed        

## 2022-02-09 ENCOUNTER — Encounter: Payer: Self-pay | Admitting: Family Medicine

## 2022-02-09 ENCOUNTER — Ambulatory Visit (INDEPENDENT_AMBULATORY_CARE_PROVIDER_SITE_OTHER): Payer: Medicare Other | Admitting: Family Medicine

## 2022-02-09 ENCOUNTER — Other Ambulatory Visit: Payer: Self-pay | Admitting: *Deleted

## 2022-02-09 VITALS — BP 109/73 | HR 70 | Temp 98.4°F | Ht 69.0 in | Wt 173.2 lb

## 2022-02-09 DIAGNOSIS — E785 Hyperlipidemia, unspecified: Secondary | ICD-10-CM

## 2022-02-09 DIAGNOSIS — Z0000 Encounter for general adult medical examination without abnormal findings: Secondary | ICD-10-CM | POA: Diagnosis not present

## 2022-02-09 DIAGNOSIS — R351 Nocturia: Secondary | ICD-10-CM

## 2022-02-09 LAB — CBC WITH DIFFERENTIAL/PLATELET
Basophils Absolute: 0.1 10*3/uL (ref 0.0–0.1)
Basophils Relative: 1.2 % (ref 0.0–3.0)
Eosinophils Absolute: 0.3 10*3/uL (ref 0.0–0.7)
Eosinophils Relative: 4.2 % (ref 0.0–5.0)
HCT: 40.9 % (ref 39.0–52.0)
Hemoglobin: 14 g/dL (ref 13.0–17.0)
Lymphocytes Relative: 20.6 % (ref 12.0–46.0)
Lymphs Abs: 1.3 10*3/uL (ref 0.7–4.0)
MCHC: 34.3 g/dL (ref 30.0–36.0)
MCV: 98.6 fl (ref 78.0–100.0)
Monocytes Absolute: 0.6 10*3/uL (ref 0.1–1.0)
Monocytes Relative: 9.4 % (ref 3.0–12.0)
Neutro Abs: 4.2 10*3/uL (ref 1.4–7.7)
Neutrophils Relative %: 64.6 % (ref 43.0–77.0)
Platelets: 255 10*3/uL (ref 150.0–400.0)
RBC: 4.15 Mil/uL — ABNORMAL LOW (ref 4.22–5.81)
RDW: 13.6 % (ref 11.5–15.5)
WBC: 6.4 10*3/uL (ref 4.0–10.5)

## 2022-02-09 LAB — LIPID PANEL
Cholesterol: 98 mg/dL (ref 0–200)
HDL: 35.3 mg/dL — ABNORMAL LOW (ref >39.00)
LDL Cholesterol: 49 mg/dL (ref 0–99)
NonHDL: 63.04
Total CHOL/HDL Ratio: 3
Triglycerides: 71 mg/dL (ref 0.0–149.0)
VLDL: 14.2 mg/dL (ref 0.0–40.0)

## 2022-02-09 LAB — COMPREHENSIVE METABOLIC PANEL
ALT: 13 U/L (ref 0–53)
AST: 16 U/L (ref 0–37)
Albumin: 4.4 g/dL (ref 3.5–5.2)
Alkaline Phosphatase: 51 U/L (ref 39–117)
BUN: 16 mg/dL (ref 6–23)
CO2: 27 mEq/L (ref 19–32)
Calcium: 9.3 mg/dL (ref 8.4–10.5)
Chloride: 105 mEq/L (ref 96–112)
Creatinine, Ser: 1.45 mg/dL (ref 0.40–1.50)
GFR: 47.89 mL/min — ABNORMAL LOW (ref >60.00)
Glucose, Bld: 93 mg/dL (ref 70–99)
Potassium: 4.7 mEq/L (ref 3.5–5.1)
Sodium: 140 mEq/L (ref 135–145)
Total Bilirubin: 1.1 mg/dL (ref 0.2–1.2)
Total Protein: 7 g/dL (ref 6.0–8.3)

## 2022-02-09 LAB — PSA: PSA: 2.97 ng/mL (ref 0.10–4.00)

## 2022-02-09 MED ORDER — ATENOLOL 50 MG PO TABS: 25.0000 mg | ORAL_TABLET | Freq: Two times a day (BID) | ORAL | 3 refills | Status: DC

## 2022-02-09 MED ORDER — QUETIAPINE FUMARATE 25 MG PO TABS: 12.5000 mg | ORAL_TABLET | Freq: Every evening | ORAL | 3 refills | Status: DC | PRN

## 2022-02-09 MED ORDER — ATORVASTATIN CALCIUM 20 MG PO TABS: 20.0000 mg | ORAL_TABLET | Freq: Every day | ORAL | 3 refills | Status: DC

## 2022-02-09 MED ORDER — DICLOFENAC SODIUM 1 % EX GEL: 4.0000 g | CUTANEOUS | 3 refills | Status: AC | PRN

## 2022-02-09 MED ORDER — AMLODIPINE BESYLATE 5 MG PO TABS: ORAL_TABLET | ORAL | 3 refills | Status: DC

## 2022-02-09 MED ORDER — DIAZEPAM 5 MG PO TABS: ORAL_TABLET | ORAL | 5 refills | Status: DC

## 2022-02-09 NOTE — Patient Instructions (Addendum)
Health Maintenance Due  Topic Date Due   Medicare Annual Wellness (AWV)  04/26/2020  You are eligible to schedule your annual wellness visit with our nurse specialist Otila Kluver.  Please consider scheduling this before you leave today  Please stop by lab before you go If you have mychart- we will send your results within 3 business days of [place] receiving them.  If you do not have mychart- we will call you about results within 5 business days of [place] receiving them.  *please also note that you will see labs on mychart as soon as they post. I will later go in and write notes on them- will say "notes from Dr. Yong Channel"   Recommended follow up: Return in about 6 months (around 08/10/2022) for followup or sooner if needed.Schedule b4 you leave.

## 2022-02-09 NOTE — Addendum Note (Signed)
Addended by: Marin Olp on: 02/09/2022 05:52 PM   Modules accepted: Orders

## 2022-02-09 NOTE — Progress Notes (Signed)
Phone: [phone]   Subjective:  Patient presents today for their annual physical. Chief complaint-noted.   See problem oriented charting- ROS- full  review of systems was completed and negative  except for: some knee pain/arthitis issues at times  The following were reviewed and entered/updated in epic: Past Medical History:  Diagnosis Date   ANXIETY 11/07/2006   HYPERLIPIDEMIA 07/31/2007   HYPERTENSION 11/07/2006   OSTEOARTHRITIS 11/07/2006   Sleep apnea    hasn't used cpap for "10-12 years"   Patient Active Problem List   Diagnosis Date Noted   Precordial chest pain 06/03/2019    Priority: Medium    Chronic kidney disease (CKD), stage III (moderate) ([place]) 04/03/2019    Priority: Medium    Hyperlipidemia 07/31/2007    Priority: Medium    Insomnia 11/07/2006    Priority: Medium    Essential hypertension 11/07/2006    Priority: Medium    RBBB 04/23/2019    Priority: Low   History of adenomatous polyp of colon 04/13/2016    Priority: Low   Actinic keratosis 08/08/2015    Priority: Low   Plantar fasciitis of left foot 05/16/2011    Priority: Low   Osteoarthritis of right knee 11/07/2006    Priority: Low   Palpitations 12/28/2021   Anxiety 04/03/2019   Past Surgical History:  Procedure Laterality Date   CATARACT EXTRACTION     bilateral   COLONOSCOPY  04/09/2016   Dr.Stark-polyp   HERNIA REPAIR     2001 Dr Lennie Hummer left groin; right 06-16-10   KNEE ARTHROSCOPY Right Jun 16, 2006   POLYPECTOMY      Family History  Problem Relation Age of Onset   Heart disease Mother        pacer; MI late 65s or early 40s, CABG   Hyperlipidemia Mother    Hypertension Mother    Pneumonia Father        72- didnt bounce back. died 06/15/20   Other Sister        MVC   Heart attack Brother        age 58, smoker   Colon cancer Neg Hx    Esophageal cancer Neg Hx    Stomach cancer Neg Hx    Rectal cancer Neg Hx    Colon polyps Neg Hx     Medications- reviewed and updated Current  Outpatient Medications  Medication Sig Dispense Refill   amLODipine (NORVASC) 5 MG tablet TAKE 1 TABLET BY MOUTH DAILY 90 tablet 3   atenolol (TENORMIN) 50 MG tablet TAKE 1 TABLET BY MOUTH EVERY DAY (Patient taking differently: Take 25 mg by mouth 2 (two) times daily.) 90 tablet 1   atorvastatin (LIPITOR) 20 MG tablet TAKE 1 TABLET BY MOUTH EVERY DAY 90 tablet 3   diazepam (VALIUM) 5 MG tablet TAKE 1/2 TO 1 TABLET BY MOUTH EVERY DAY AS NEEDED FOR SLEEP 30 tablet 5   diclofenac Sodium (VOLTAREN) 1 % GEL Apply 4 g topically as needed. 150 g 3   QUEtiapine (SEROQUEL) 25 MG tablet TAKE 1/2 TABLET BY MOUTH AT BEDTIME AS NEEDED FOR SLEEP 45 tablet 3   No current facility-administered medications for this visit.    Allergies-reviewed and updated No Known Allergies  Social History   Social History Narrative   Married 06/16/1975. [place]. [place]. 2 grandkids from [place] 06-16-2003, [place] 06/15/2005.       Retired Tour manager    Objective  Objective:  BP 109/73 Comment: home reading 9 am this  morning  Pulse 70   Temp 98.4 F (36.9 C) (Temporal)   Ht '5\' 9"'$  (1.753 m)   Wt 173 lb 4 oz (78.6 kg)   SpO2 98%   BMI 25.58 kg/m  Gen: NAD, resting comfortably HEENT: Mucous membranes are moist. Oropharynx normal Neck: no thyromegaly CV: RRR no murmurs rubs or gallops Lungs: CTAB no crackles, wheeze, rhonchi Abdomen: soft/nontender/nondistended/normal bowel sounds. No rebound or guarding.  Ext: no edema Skin: warm, dry Neuro: grossly normal, moves all extremities, PERRLA   Assessment and Plan  71 y.o. male presenting for annual physical.  Health Maintenance counseling: 1. Anticipatory guidance: Patient counseled regarding regular dental exams -q6 months, eye exams -yearly,  avoiding smoking and second hand smoke , limiting alcohol to 2 beverages per day- doesn't drink, no illicit drugs .   2. Risk factor reduction:  Advised patient of need for regular exercise and diet  rich and fruits and vegetables to reduce risk of heart attack and stroke.  Exercise- active such as doing leaves but not exercising regularly. Goal 150 minutes a week Diet/weight management-backed off sodas- weight down some 4 lbs in last year. Congratulated his efforts on kicking mountain dew to the curb and reducing zebra cakes- reducing sugar Wt Readings from Last 3 Encounters:  02/09/22 173 lb 4 oz (78.6 kg)  12/29/21 178 lb (80.7 kg)  12/26/21 176 lb (79.8 kg)  3. Immunizations/screenings/ancillary studies- may hold off on covid shot- debating, already had flu shot, rsv opts out Immunization History  Administered Date(s) Administered   Fluad Quad(high Dose 65+) 12/11/2018, 12/29/2019, 01/04/2022   Influenza Split 02/02/2011, 01/15/2012, 12/20/2017   Influenza Whole 12/20/2007, 01/27/2009, 03/01/2010   Influenza, High Dose Seasonal PF 02/09/2016, 12/25/2016, 12/20/2017, 12/30/2020   Influenza,inj,Quad PF,6+ Mos 12/26/2012, 12/31/2013, 01/03/2015   PFIZER(Purple Top)SARS-COV-2 Vaccination 05/01/2019, 05/22/2019, 01/18/2020   Pfizer Covid-19 Vaccine Bivalent Booster 37yr & up 04/29/2021   Pneumococcal Conjugate-13 02/08/2015   Pneumococcal Polysaccharide-23 12/10/2013   Td 03/26/2000   Tdap 10/10/2011, 08/22/2018   Zoster Recombinat (Shingrix) 08/02/2017, 10/19/2017   Zoster, Live 03/01/2010  4. Prostate cancer screening- biopsy in past- prior psa trended up but came back down. Still pees at night at times but no worse- wants to monitor  Lab Results  Component Value Date   PSA 2.79 02/07/2021   PSA 3.70 08/31/2020   PSA 5.65 (H) 08/01/2020   5. Colon cancer screening - adenoma 2023 Dr. SFuller Planbut no further follow-up due to age 71 Skin cancer screening- sees Dr. HNevada Crane advised regular sunscreen use. Denies worrisome, changing, or new skin lesions.  7. Smoking associated screening (lung cancer screening, AAA screen 65-75, UA)- never smoker 8. STD screening - only active with wife  sexually  Status of chronic or acute concerns   #Atypical chest pain in late 2020 and 2023- low risk stress test 06/12/2019 with Dr. HRuthann Cancernoted the right bundle branch block makes more difficult to interpret -Recurrent pain in September 2023 leading to emergency department visit-pain thankfully resolved and Dr. HPercival Spanishdid not recommend further follow-up at this time  #hypertension/CKD stage III S: medication:  amlodipine '5mg'$ , atenolol '50mg'$ - half tablet twice daily Home readings #s: on my check 130/92 and his check 132/91- very close  BP Readings from Last 3 Encounters:  02/09/22 109/73  12/29/21 130/82  12/26/21 124/86   GFR is typically-in the 50s  but can be into the 40s A/P: For hypertension- well controlled at home- does great job monitoring regularly and most recent reading at  home this morning was 109/73- likely has white coat element- continue with current medicatoin For CKD stage III-Hopefuly stable or improved- update cmp with labs today. Continue without meds for now.  #hyperlipidemia-LDL goal at least under 100.  Ideally under 70. S: Medication:Atorvastatin 20 mg Lab Results  Component Value Date   CHOL 108 02/07/2021   HDL 34.50 (L) 02/07/2021   LDLCALC 59 02/07/2021   LDLDIRECT 79.0 09/11/2018   TRIG 71.0 02/07/2021   CHOLHDL 3 02/07/2021   A/P: LDL at ideal goal on last check but due for repeat-ordered lipid panel today   #Insomnia S: Medication: Seroquel 12.5 mg, diazepam 5 mg for sleep   Has also been treated for anxiety in the past with buspirone-actually worsened anxiety.  This was around time of loss of his mother and near death of father in June 20, 2018. -still working well A/P: Controlled. Continue current medications.    #Primary osteoarthritis of right knee-worsening renal function on oral NSAIDs in the past.  I recommended avoiding NSAIDs.  Has seen Dr. Ninfa Linden. PT helped some. steroid and gel injections not helpful -trying to avoid meds at present  other than occasional gel  Recommended follow up: Return in about 6 months (around 08/10/2022) for followup or sooner if needed.Schedule b4 you leave.  Lab/Order associations: fasting   ICD-10-CM   1. Preventative health care  Z00.00     2. Hyperlipidemia, unspecified hyperlipidemia type  E78.5 CBC with Differential/Platelet    Comprehensive metabolic panel    Lipid panel      No orders of the defined types were placed in this encounter.   Return precautions advised.  Garret Reddish, MD

## 2022-03-16 ENCOUNTER — Ambulatory Visit (INDEPENDENT_AMBULATORY_CARE_PROVIDER_SITE_OTHER): Payer: Medicare Other

## 2022-03-16 VITALS — Wt 173.0 lb

## 2022-03-16 DIAGNOSIS — Z0000 Encounter for general adult medical examination without abnormal findings: Secondary | ICD-10-CM

## 2022-03-16 NOTE — Patient Instructions (Signed)
Nathan Young , Thank you for taking time to come for your Medicare Wellness Visit. I appreciate your ongoing commitment to your health goals. Please review the following plan we discussed and let me know if I can assist you in the future.   These are the goals we discussed:  Goals      Patient Stated     Stay healthy         This is a list of the screening recommended for you and due dates:  Health Maintenance  Topic Date Due   COVID-19 Vaccine (5 - 2023-24 season) 02/10/2023*   Hepatitis C Screening: USPSTF Recommendation to screen - Ages 18-79 yo.  03/22/2098*   Medicare Annual Wellness Visit  03/17/2023   DTaP/Tdap/Td vaccine (4 - Td or Tdap) 08/21/2028   Pneumonia Vaccine  Completed   Flu Shot  Completed   Zoster (Shingles) Vaccine  Completed   HPV Vaccine  Aged Out   Colon Cancer Screening  Discontinued  *Topic was postponed. The date shown is not the original due date.    Advanced directives: Advance directive discussed with you today. Even though you declined this today please call our office should you change your mind and we can give you the proper paperwork for you to fill out.  Conditions/risks identified: stay healthy   Next appointment: Follow up in one year for your annual wellness visit.   Preventive Care 72 Years and Older, Male  Preventive care refers to lifestyle choices and visits with your health care provider that can promote health and wellness. What does preventive care include? A yearly physical exam. This is also called an annual well check. Dental exams once or twice a year. Routine eye exams. Ask your health care provider how often you should have your eyes checked. Personal lifestyle choices, including: Daily care of your teeth and gums. Regular physical activity. Eating a healthy diet. Avoiding tobacco and drug use. Limiting alcohol use. Practicing safe sex. Taking low doses of aspirin every day. Taking vitamin and mineral supplements as  recommended by your health care provider. What happens during an annual well check? The services and screenings done by your health care provider during your annual well check will depend on your age, overall health, lifestyle risk factors, and family history of disease. Counseling  Your health care provider may ask you questions about your: Alcohol use. Tobacco use. Drug use. Emotional well-being. Home and relationship well-being. Sexual activity. Eating habits. History of falls. Memory and ability to understand (cognition). Work and work Statistician. Screening  You may have the following tests or measurements: Height, weight, and BMI. Blood pressure. Lipid and cholesterol levels. These may be checked every 5 years, or more frequently if you are over 44 years old. Skin check. Lung cancer screening. You may have this screening every year starting at age 19 if you have a 30-pack-year history of smoking and currently smoke or have quit within the past 15 years. Fecal occult blood test (FOBT) of the stool. You may have this test every year starting at age 11. Flexible sigmoidoscopy or colonoscopy. You may have a sigmoidoscopy every 5 years or a colonoscopy every 10 years starting at age 7. Prostate cancer screening. Recommendations will vary depending on your family history and other risks. Hepatitis C blood test. Hepatitis B blood test. Sexually transmitted disease (STD) testing. Diabetes screening. This is done by checking your blood sugar (glucose) after you have not eaten for a while (fasting). You may have this  done every 1-3 years. Abdominal aortic aneurysm (AAA) screening. You may need this if you are a current or former smoker. Osteoporosis. You may be screened starting at age 21 if you are at high risk. Talk with your health care provider about your test results, treatment options, and if necessary, the need for more tests. Vaccines  Your health care provider may recommend  certain vaccines, such as: Influenza vaccine. This is recommended every year. Tetanus, diphtheria, and acellular pertussis (Tdap, Td) vaccine. You may need a Td booster every 10 years. Zoster vaccine. You may need this after age 11. Pneumococcal 13-valent conjugate (PCV13) vaccine. One dose is recommended after age 40. Pneumococcal polysaccharide (PPSV23) vaccine. One dose is recommended after age 78. Talk to your health care provider about which screenings and vaccines you need and how often you need them. This information is not intended to replace advice given to you by your health care provider. Make sure you discuss any questions you have with your health care provider. Document Released: 04/08/2015 Document Revised: 11/30/2015 Document Reviewed: 01/11/2015 Elsevier Interactive Patient Education  2017 [place] Prevention in the Home Falls can cause injuries. They can happen to people of all ages. There are many things you can do to make your home safe and to help prevent falls. What can I do on the outside of my home? Regularly fix the edges of walkways and driveways and fix any cracks. Remove anything that might make you trip as you walk through a door, such as a raised step or threshold. Trim any bushes or trees on the path to your home. Use bright outdoor lighting. Clear any walking paths of anything that might make someone trip, such as rocks or tools. Regularly check to see if handrails are loose or broken. Make sure that both sides of any steps have handrails. Any raised decks and porches should have guardrails on the edges. Have any leaves, snow, or ice cleared regularly. Use sand or salt on walking paths during winter. Clean up any spills in your garage right away. This includes oil or grease spills. What can I do in the bathroom? Use night lights. Install grab bars by the toilet and in the tub and shower. Do not use towel bars as grab bars. Use non-skid mats or  decals in the tub or shower. If you need to sit down in the shower, use a plastic, non-slip stool. Keep the floor dry. Clean up any water that spills on the floor as soon as it happens. Remove soap buildup in the tub or shower regularly. Attach bath mats securely with double-sided non-slip rug tape. Do not have throw rugs and other things on the floor that can make you trip. What can I do in the bedroom? Use night lights. Make sure that you have a light by your bed that is easy to reach. Do not use any sheets or blankets that are too big for your bed. They should not hang down onto the floor. Have a firm chair that has side arms. You can use this for support while you get dressed. Do not have throw rugs and other things on the floor that can make you trip. What can I do in the kitchen? Clean up any spills right away. Avoid walking on wet floors. Keep items that you use a lot in easy-to-reach places. If you need to reach something above you, use a strong step stool that has a grab bar. Keep electrical cords out of  the way. Do not use floor polish or wax that makes floors slippery. If you must use wax, use non-skid floor wax. Do not have throw rugs and other things on the floor that can make you trip. What can I do with my stairs? Do not leave any items on the stairs. Make sure that there are handrails on both sides of the stairs and use them. Fix handrails that are broken or loose. Make sure that handrails are as long as the stairways. Check any carpeting to make sure that it is firmly attached to the stairs. Fix any carpet that is loose or worn. Avoid having throw rugs at the top or bottom of the stairs. If you do have throw rugs, attach them to the floor with carpet tape. Make sure that you have a light switch at the top of the stairs and the bottom of the stairs. If you do not have them, ask someone to add them for you. What else can I do to help prevent falls? Wear shoes that: Do not  have high heels. Have rubber bottoms. Are comfortable and fit you well. Are closed at the toe. Do not wear sandals. If you use a stepladder: Make sure that it is fully opened. Do not climb a closed stepladder. Make sure that both sides of the stepladder are locked into place. Ask someone to hold it for you, if possible. Clearly mark and make sure that you can see: Any grab bars or handrails. First and last steps. Where the edge of each step is. Use tools that help you move around (mobility aids) if they are needed. These include: Canes. Walkers. Scooters. Crutches. Turn on the lights when you go into a dark area. Replace any light bulbs as soon as they burn out. Set up your furniture so you have a clear path. Avoid moving your furniture around. If any of your floors are uneven, fix them. If there are any pets around you, be aware of where they are. Review your medicines with your doctor. Some medicines can make you feel dizzy. This can increase your chance of falling. Ask your doctor what other things that you can do to help prevent falls. This information is not intended to replace advice given to you by your health care provider. Make sure you discuss any questions you have with your health care provider. Document Released: 01/06/2009 Document Revised: 08/18/2015 Document Reviewed: 04/16/2014 Elsevier Interactive Patient Education  2017 [employer].

## 2022-03-16 NOTE — Progress Notes (Signed)
I connected with  Nathan Young on 03/16/22 by a audio enabled telemedicine application and verified that I am speaking with the correct person using two identifiers.  Patient Location: Home  Provider Location: Office/Clinic  I discussed the limitations of evaluation and management by telemedicine. The patient expressed understanding and agreed to proceed.   Subjective:   Nathan Young is a 71 y.o. male who presents for Medicare Annual/Subsequent preventive examination.  Review of Systems     Cardiac Risk Factors include: advanced age (>53mn, >>73women);hypertension;dyslipidemia;male gender     Objective:    Today's Vitals   03/16/22 0917  Weight: 173 lb (78.5 kg)   Body mass index is 25.55 kg/m.     03/16/2022    9:21 AM 12/22/2021   10:09 AM 04/27/2019   12:02 PM 03/26/2019   12:00 PM 08/22/2018    6:10 PM 04/09/2016   12:55 PM 03/23/2016   12:52 PM  Advanced Directives  Does Patient Have a Medical Advance Directive? No No Yes No No No No  Type of Advance Directive   Living will;Healthcare Power of Attorney      Does patient want to make changes to medical advance directive?   No - Patient declined      Copy of [place] Chart?   No - copy requested      Would patient like information on creating a medical advance directive? No - Patient declined No - Patient declined  No - Patient declined No - Patient declined      Current Medications (verified) Outpatient Encounter Medications as of 03/16/2022  Medication Sig   amLODipine (NORVASC) 5 MG tablet TAKE 1 TABLET BY MOUTH DAILY   atenolol (TENORMIN) 50 MG tablet Take 0.5 tablets (25 mg total) by mouth 2 (two) times daily.   atorvastatin (LIPITOR) 20 MG tablet Take 1 tablet (20 mg total) by mouth daily.   diazepam (VALIUM) 5 MG tablet TAKE 1/2 TO 1 TABLET BY MOUTH EVERY DAY AS NEEDED FOR SLEEP   diclofenac Sodium (VOLTAREN) 1 % GEL Apply 4 g topically as needed.   QUEtiapine (SEROQUEL) 25 MG  tablet Take 0.5 tablets (12.5 mg total) by mouth at bedtime as needed.   No facility-administered encounter medications on file as of 03/16/2022.    Allergies (verified) Patient has no known allergies.   History: Past Medical History:  Diagnosis Date   ANXIETY 11/07/2006   HYPERLIPIDEMIA 07/31/2007   HYPERTENSION 11/07/2006   OSTEOARTHRITIS 11/07/2006   Sleep apnea    hasn't used cpap for "10-12 years"   Past Surgical History:  Procedure Laterality Date   CATARACT EXTRACTION     bilateral   COLONOSCOPY  04/09/2016   Dr.Stark-polyp   HERNIA REPAIR     2001 Dr TLennie Hummerleft groin; right 22012/04/06  KNEE ARTHROSCOPY Right 22008-04-06  POLYPECTOMY     Family History  Problem Relation Age of Onset   Heart disease Mother        pacer; MI late 647sor early 725s CABG   Hyperlipidemia Mother    Hypertension Mother    Pneumonia Father        959 didnt bounce back. died 22022-04-06  Other Sister        MVC   Heart attack Brother        age 73 smoker   Colon cancer Neg Hx    Esophageal cancer Neg Hx    Stomach cancer Neg Hx  Rectal cancer Neg Hx    Colon polyps Neg Hx    Social History   Socioeconomic History   Marital status: Married    Spouse name: Not on file   Number of children: 2   Years of education: Not on file   Highest education level: Not on file  Occupational History   Occupation: Retired   Tobacco Use   Smoking status: Never   Smokeless tobacco: Never  Vaping Use   Vaping Use: Never used  Substance and Sexual Activity   Alcohol use: No   Drug use: No   Sexual activity: Yes  Other Topics Concern   Not on file  Social History Narrative   Married 1977. [place]. [place]. 2 grandkids from [place] 2005, [place] 2007.       Retired Tour manager    Social Determinants of Radio broadcast assistant Strain: [place]  (03/16/2022)   Overall Financial Resource Strain (CARDIA)    Difficulty of Paying Living Expenses: Not hard at all   Food Insecurity: No Food Insecurity (03/16/2022)   Hunger Vital Sign    Worried About Running Out of Food in the Last Year: Never true    [place] in the Last Year: Never true  Transportation Needs: No Transportation Needs (03/16/2022)   PRAPARE - Hydrologist (Medical): No    Lack of Transportation (Non-Medical): No  Physical Activity: Sufficiently Active (03/16/2022)   Exercise Vital Sign    Days of Exercise per Week: 5 days    Minutes of Exercise per Session: 60 min  Stress: No Stress Concern Present (03/16/2022)   [place]    Feeling of Stress : Not at all  Social Connections: Moderately Integrated (03/16/2022)   Social Connection and Isolation Panel [NHANES]    Frequency of Communication with Friends and Family: Once a week    Frequency of Social Gatherings with Friends and Family: More than three times a week    Attends Religious Services: 1 to 4 times per year    Active Member of [employer] or Organizations: No    Attends Music therapist: Never    Marital Status: Married    Tobacco Counseling Counseling given: Not Answered   Clinical Intake:  Pre-visit preparation completed: Yes  Pain : No/denies pain     BMI - recorded: 25.55 Nutritional Status: BMI 25 -29 Overweight Nutritional Risks: None Diabetes: No  How often do you need to have someone help you when you read instructions, pamphlets, or other written materials from your doctor or pharmacy?: 1 - Never  Diabetic?no  Interpreter Needed?: No  Information entered by :: Charlott Rakes, LPN   Activities of Daily Living    03/16/2022    9:22 AM  In your present state of health, do you have any difficulty performing the following activities:  Hearing? 0  Vision? 0  Difficulty concentrating or making decisions? 0  Walking or climbing stairs? 0  Dressing or bathing? 0  Doing errands,  shopping? 0  Preparing Food and eating ? N  Using the Toilet? N  In the past six months, have you accidently leaked urine? N  Do you have problems with loss of bowel control? N  Managing your Medications? N  Managing your Finances? N  Housekeeping or managing your Housekeeping? N    Patient Care Team: Marin Olp, MD as PCP - General (Family  Medicine) Alanda Slim Neena Rhymes, MD as Consulting Physician (Ophthalmology)  Indicate any recent Medical Services you may have received from other than [facility] providers in the past year (date may be approximate).     Assessment:   This is a routine wellness examination for [place].  Hearing/Vision screen Hearing Screening - Comments:: Pt denies any hearing issues  Vision Screening - Comments:: Pt follows up [facility] for annual eye exams   Dietary issues and exercise activities discussed: Current Exercise Habits: Home exercise routine (yard work and Biomedical scientist fathers yard), Type of exercise: walking;Other - see comments, Time (Minutes): 60, Frequency (Times/Week): 5, Weekly Exercise (Minutes/Week): 300   Goals Addressed             This Visit's Progress    Patient Stated       Stay healthy        Depression Screen    03/16/2022    9:20 AM 02/09/2022   10:06 AM 02/07/2021   11:25 AM 08/01/2020   11:15 AM 07/29/2019    9:47 AM 04/27/2019   12:03 PM 04/20/2019    4:03 PM  PHQ 2/9 Scores  PHQ - 2 Score 0 0 0 0 0 0 0  PHQ- 9 Score   0 0 0  0    Fall Risk    03/16/2022    9:22 AM 02/09/2022   10:06 AM 06/15/2021    9:53 AM 08/01/2020   11:15 AM 07/29/2019    9:47 AM  Fall Risk   Falls in the past year? 0 0 0 0 0  Number falls in past yr: 0 0 0 0 0  Injury with Fall? 0 0 0 0 0  Risk for fall due to : Impaired vision      Follow up Falls prevention discussed Falls evaluation completed       FALL RISK PREVENTION PERTAINING TO THE HOME:  Any stairs in or around the home? No  If so, are there any without handrails? No   Home free of loose throw rugs in walkways, pet beds, electrical cords, etc? Yes  Adequate lighting in your home to reduce risk of falls? Yes   ASSISTIVE DEVICES UTILIZED TO PREVENT FALLS:  Life alert? No  Use of a cane, walker or w/c? No  Grab bars in the bathroom? Yes  Shower chair or bench in shower? No  Elevated toilet seat or a handicapped toilet? No   TIMED UP AND GO:  Was the test performed? No .   Cognitive Function:        03/16/2022    9:23 AM 04/27/2019   12:02 PM  6CIT Screen  What Year? 0 points 0 points  What month? 0 points 0 points  What time? 0 points 0 points  Count back from 20 0 points 0 points  Months in reverse 0 points 0 points  Repeat phrase 0 points 0 points  Total Score 0 points 0 points    Immunizations Immunization History  Administered Date(s) Administered   Fluad Quad(high Dose 65+) 12/11/2018, 12/29/2019, 01/04/2022   Influenza Split 02/02/2011, 01/15/2012, 12/20/2017   Influenza Whole 12/20/2007, 01/27/2009, 03/01/2010   Influenza, High Dose Seasonal PF 02/09/2016, 12/25/2016, 12/20/2017, 12/30/2020   Influenza,inj,Quad PF,6+ Mos 12/26/2012, 12/31/2013, 01/03/2015   PFIZER(Purple Top)SARS-COV-2 Vaccination 05/01/2019, 05/22/2019, 01/18/2020   Pfizer Covid-19 Vaccine Bivalent Booster 47yr & up 04/29/2021   Pneumococcal Conjugate-13 02/08/2015   Pneumococcal Polysaccharide-23 12/10/2013   Td 03/26/2000   Tdap 10/10/2011, 08/22/2018   Zoster Recombinat (  Shingrix) 08/02/2017, 10/19/2017   Zoster, Live 03/01/2010    TDAP status: Up to date  Flu Vaccine status: Up to date  Pneumococcal vaccine status: Up to date  Covid-19 vaccine status: Completed vaccines  Qualifies for Shingles Vaccine? Yes   Zostavax completed Yes   Shingrix Completed?: Yes  Screening Tests Health Maintenance  Topic Date Due   COVID-19 Vaccine (5 - 2023-24 season) 02/10/2023 (Originally 11/24/2021)   Hepatitis C Screening  03/22/2098 (Originally 11/03/1968)    Medicare Annual Wellness (AWV)  03/17/2023   DTaP/Tdap/Td (4 - Td or Tdap) 08/21/2028   Pneumonia Vaccine 76+ Years old  Completed   INFLUENZA VACCINE  Completed   Zoster Vaccines- Shingrix  Completed   HPV VACCINES  Aged Out   COLONOSCOPY (Pts 45-84yr Insurance coverage will need to be confirmed)  Discontinued    Health Maintenance  There are no preventive care reminders to display for this patient.   Colorectal cancer screening: No longer required.     Additional Screening:  Hepatitis C Screening: does qualify;  Vision Screening: Recommended annual ophthalmology exams for early detection of glaucoma and other disorders of the eye. Is the patient up to date with their annual eye exam?  Yes  Who is the provider or what is the name of the office in which the patient attends annual eye exams? [facility]  If pt is not established with a provider, would they like to be referred to a provider to establish care? No .   Dental Screening: Recommended annual dental exams for proper oral hygiene  Community Resource Referral / Chronic Care Management: CRR required this visit?  No   CCM required this visit?  No      Plan:     I have personally reviewed and noted the following in the patient's chart:   Medical and social history Use of alcohol, tobacco or illicit drugs  Current medications and supplements including opioid prescriptions. Patient is not currently taking opioid prescriptions. Functional ability and status Nutritional status Physical activity Advanced directives List of other physicians Hospitalizations, surgeries, and ER visits in previous 12 months Vitals Screenings to include cognitive, depression, and falls Referrals and appointments  In addition, I have reviewed and discussed with patient certain preventive protocols, quality metrics, and best practice recommendations. A written personalized care plan for preventive services as well as general  preventive health recommendations were provided to patient.     TWillette Brace LPN   [id number]  Nurse Notes: none

## 2022-08-06 ENCOUNTER — Ambulatory Visit (INDEPENDENT_AMBULATORY_CARE_PROVIDER_SITE_OTHER): Payer: Medicare Other | Admitting: Family Medicine

## 2022-08-06 ENCOUNTER — Encounter: Payer: Self-pay | Admitting: Family Medicine

## 2022-08-06 VITALS — BP 120/72 | HR 62 | Temp 98.0°F | Ht 69.0 in | Wt 168.0 lb

## 2022-08-06 DIAGNOSIS — M1711 Unilateral primary osteoarthritis, right knee: Secondary | ICD-10-CM

## 2022-08-06 DIAGNOSIS — I10 Essential (primary) hypertension: Secondary | ICD-10-CM | POA: Diagnosis not present

## 2022-08-06 DIAGNOSIS — E785 Hyperlipidemia, unspecified: Secondary | ICD-10-CM

## 2022-08-06 DIAGNOSIS — N183 Chronic kidney disease, stage 3 unspecified: Secondary | ICD-10-CM

## 2022-08-06 DIAGNOSIS — G4700 Insomnia, unspecified: Secondary | ICD-10-CM | POA: Diagnosis not present

## 2022-08-06 LAB — COMPREHENSIVE METABOLIC PANEL WITH GFR
ALT: 14 U/L (ref 0–53)
AST: 18 U/L (ref 0–37)
Albumin: 4.2 g/dL (ref 3.5–5.2)
Alkaline Phosphatase: 48 U/L (ref 39–117)
BUN: 15 mg/dL (ref 6–23)
CO2: 27 meq/L (ref 19–32)
Calcium: 9.6 mg/dL (ref 8.4–10.5)
Chloride: 104 meq/L (ref 96–112)
Creatinine, Ser: 1.39 mg/dL (ref 0.40–1.50)
GFR: 50.21 mL/min — ABNORMAL LOW
Glucose, Bld: 88 mg/dL (ref 70–99)
Potassium: 4.5 meq/L (ref 3.5–5.1)
Sodium: 140 meq/L (ref 135–145)
Total Bilirubin: 0.9 mg/dL (ref 0.2–1.2)
Total Protein: 6.8 g/dL (ref 6.0–8.3)

## 2022-08-06 NOTE — Patient Instructions (Addendum)
blood pressure at home usually 100s or 110s over 70s or low 80's with average probably 110/75 roughly- we opted to have him try 2.5 mg of amlodipine (can even cut your 5 mg in half if youw ould like) and continue atenolol at same dose- he will update me in 3-4 weeks with home readings- we would like average <130/80 - if above this go back up to 5 mg dose  Please stop by lab before you go If you have mychart- we will send your results within 3 business days of [place] receiving them.  If you do not have mychart- we will call you about results within 5 business days of [place] receiving them.  *please also note that you will see labs on mychart as soon as they post. I will later go in and write notes on them- will say "notes from Dr. Durene Cal"   Recommended follow up: Return in about 6 months (around 02/06/2023) for physical or sooner if needed.Schedule b4 you leave.

## 2022-08-06 NOTE — Progress Notes (Signed)
Phone [phone] In person visit   Subjective:   Nathan Young is a 72 y.o. year old very pleasant male patient who presents for/with See problem oriented charting Chief Complaint  Patient presents with   Medical Management of Chronic Issues   Hypertension    Past Medical History-  Patient Active Problem List   Diagnosis Date Noted   Precordial chest pain 06/03/2019    Priority: Medium    Chronic kidney disease (CKD), stage III (moderate) (HCC) 04/03/2019    Priority: Medium    Hyperlipidemia 07/31/2007    Priority: Medium    Insomnia 11/07/2006    Priority: Medium    Essential hypertension 11/07/2006    Priority: Medium    RBBB 04/23/2019    Priority: Low   History of adenomatous polyp of colon 04/13/2016    Priority: Low   Actinic keratosis 08/08/2015    Priority: Low   Plantar fasciitis of left foot 05/16/2011    Priority: Low   Osteoarthritis of right knee 11/07/2006    Priority: Low   Palpitations 12/28/2021   Anxiety 04/03/2019    Medications- reviewed and updated Current Outpatient Medications  Medication Sig Dispense Refill   amLODipine (NORVASC) 5 MG tablet TAKE 1 TABLET BY MOUTH DAILY 90 tablet 3   atenolol (TENORMIN) 50 MG tablet Take 0.5 tablets (25 mg total) by mouth 2 (two) times daily. 180 tablet 3   atorvastatin (LIPITOR) 20 MG tablet Take 1 tablet (20 mg total) by mouth daily. 90 tablet 3   diazepam (VALIUM) 5 MG tablet TAKE 1/2 TO 1 TABLET BY MOUTH EVERY DAY AS NEEDED FOR SLEEP 30 tablet 5   diclofenac Sodium (VOLTAREN) 1 % GEL Apply 4 g topically as needed. 150 g 3   QUEtiapine (SEROQUEL) 25 MG tablet Take 0.5 tablets (12.5 mg total) by mouth at bedtime as needed. 45 tablet 3   No current facility-administered medications for this visit.     Objective:  BP 120/72   Pulse 62   Temp 98 F (36.7 C)   Ht 5\' 9"  (1.753 m)   Wt 168 lb (76.2 kg)   SpO2 100%   BMI 24.81 kg/m  Gen: NAD, resting comfortably CV: RRR no murmurs rubs or  gallops Lungs: CTAB no crackles, wheeze, rhonchi Abdomen: soft/nontender/nondistended/normal bowel sounds. No rebound or guarding.  Ext: no edema Skin: warm, dry     Assessment and Plan   #hypertension/CKD stage III S: medication:  amlodipine 5mg , atenolol 50mg - half tablet twice daily. Does not feel poorly Home readings #s: 100s or 110s over 70s or 80's. Cut on mountain dew and wonder if that helps- slight weight loss  GFR is typically-in the 50's or high 40's- 58 on last check  A/P:For hypertension- blood pressure at home usually 100s or 110s over 70s or low 80's with average probably 110/75 roughly- we opted to have him try 2.5 mg of amlodipine and continue atenolol at same dose- he will update me in 3-4 weeks with home readings- we would like average <130/80 For CKD stage III-hopefully stable- update cmp today. Continue without meds for now    #hyperlipidemia-LDL goal at least under 100.  Ideally under 70. S: Medication:Atorvastatin 20 mg Lab Results  Component Value Date   CHOL 98 02/09/2022   HDL 35.30 (L) 02/09/2022   LDLCALC 49 02/09/2022   LDLDIRECT 79.0 09/11/2018   TRIG 71.0 02/09/2022   CHOLHDL 3 02/09/2022  A/P: stable- continue current medicines    #Insomnia S: Medication:  Seroquel 12.5 mg, diazepam 5 mg for sleep . Reports imperfect # of hours but does rest well and feels reasonably refresed in the morning A/P: stable- continue current medicines    #Primary osteoarthritis of right knee-worsening renal function on oral NSAIDs in the past.  I recommended avoiding NSAIDs.  Has seen Dr. Magnus Ivan. PT helped some. steroid and gel injections not helpful- weight loss has helped some  Recommended follow up: Return in about 6 months (around 02/06/2023) for physical or sooner if needed.Schedule b4 you leave.  Lab/Order associations:   ICD-10-CM   1. Essential hypertension  I10     2. Hyperlipidemia, unspecified hyperlipidemia type  E78.5     3. Stage 3 chronic kidney  disease, unspecified whether stage 3a or 3b CKD (HCC)  N18.30 Comp Met (CMET)     No orders of the defined types were placed in this encounter.  Return precautions advised.  Tana Conch, MD

## 2022-08-27 ENCOUNTER — Other Ambulatory Visit: Payer: Self-pay | Admitting: Family Medicine

## 2022-09-17 ENCOUNTER — Telehealth: Payer: Self-pay | Admitting: Family Medicine

## 2022-09-17 NOTE — Telephone Encounter (Signed)
Pt BP readings:  Nights - 105/73  Depends on when he feels like taking them but they are generally like the number above.   Please advise.

## 2022-09-18 NOTE — Telephone Encounter (Signed)
See below

## 2022-09-18 NOTE — Telephone Encounter (Signed)
I believe he is only on 1/2 tablet of amlodipine 5 mg so only 2.5 mg-please take him off the medication completely if so and removed from his medication list and have him update me again in about a month with home readings

## 2022-09-19 NOTE — Telephone Encounter (Signed)
Called and lm for pt tcb. 

## 2022-09-19 NOTE — Telephone Encounter (Signed)
Pt returned call and below message given, pt aware to cb in about a month with a few readings.

## 2022-10-16 ENCOUNTER — Encounter: Payer: Self-pay | Admitting: Family Medicine

## 2022-10-16 ENCOUNTER — Ambulatory Visit (INDEPENDENT_AMBULATORY_CARE_PROVIDER_SITE_OTHER): Payer: Medicare Other | Admitting: Family Medicine

## 2022-10-16 VITALS — BP 124/78 | HR 65 | Temp 98.4°F | Ht 69.0 in | Wt 166.0 lb

## 2022-10-16 DIAGNOSIS — I10 Essential (primary) hypertension: Secondary | ICD-10-CM

## 2022-10-16 DIAGNOSIS — E785 Hyperlipidemia, unspecified: Secondary | ICD-10-CM

## 2022-10-16 MED ORDER — AMLODIPINE BESYLATE 2.5 MG PO TABS: 2.5000 mg | ORAL_TABLET | Freq: Every day | ORAL | 5 refills | Status: DC | PRN

## 2022-10-16 NOTE — Patient Instructions (Addendum)
blood pressure has improved despite stopping the amlodipine- he will continue the atenolol 50 mg- half tablet twice daily -he will use the amlodipine 2.5 mg only if blood pressure is above 135/85- then wait 1-2 hours before rechecking blood pressure  -update me in 2 weeks with home blood pressure  -try to keep the salt content low- that is probably what triggered the higher blood pressure readings  Recommended follow up: Return for next already scheduled visit or sooner if needed.

## 2022-10-16 NOTE — Progress Notes (Signed)
Phone [phone] In person visit   Subjective:   Nathan Young is a 72 y.o. year old very pleasant male patient who presents for/with See problem oriented charting Chief Complaint  Patient presents with   Hypertension    Pt states bp has been fluctuating and he has "backed off of amlodipine"    Past Medical History-  Patient Active Problem List   Diagnosis Date Noted   Precordial chest pain 06/03/2019    Priority: Medium    Chronic kidney disease (CKD), stage III (moderate) (HCC) 04/03/2019    Priority: Medium    Hyperlipidemia 07/31/2007    Priority: Medium    Insomnia 11/07/2006    Priority: Medium    Essential hypertension 11/07/2006    Priority: Medium    RBBB 04/23/2019    Priority: Low   History of adenomatous polyp of colon 04/13/2016    Priority: Low   Actinic keratosis 08/08/2015    Priority: Low   Plantar fasciitis of left foot 05/16/2011    Priority: Low   Osteoarthritis of right knee 11/07/2006    Priority: Low   Palpitations 12/28/2021   Anxiety 04/03/2019    Medications- reviewed and updated Current Outpatient Medications  Medication Sig Dispense Refill   amLODipine (NORVASC) 2.5 MG tablet Take 1 tablet (2.5 mg total) by mouth daily as needed (for blood pressure over 135/85). 30 tablet 5   atenolol (TENORMIN) 50 MG tablet Take 0.5 tablets (25 mg total) by mouth 2 (two) times daily. 180 tablet 3   atorvastatin (LIPITOR) 20 MG tablet Take 1 tablet (20 mg total) by mouth daily. 90 tablet 3   diazepam (VALIUM) 5 MG tablet TAKE 1/2 TO 1 TABLET BY MOUTH EVERY DAY AS NEEDED FOR SLEEP 30 tablet 5   diclofenac Sodium (VOLTAREN) 1 % GEL Apply 4 g topically as needed. 150 g 3   QUEtiapine (SEROQUEL) 25 MG tablet Take 0.5 tablets (12.5 mg total) by mouth at bedtime as needed. 45 tablet 3   No current facility-administered medications for this visit.     Objective:  BP 124/78   Pulse 65   Temp 98.4 F (36.9 C)   Ht 5\' 9"  (1.753 m)   Wt 166 lb (75.3 kg)    SpO2 99%   BMI 24.51 kg/m  Gen: NAD, resting comfortably CV: RRR no murmurs rubs or gallops Mild tenderness with deep palpation in left upper chest  Lungs: CTAB no crackles, wheeze, rhonchi Abdomen: soft/nontender/nondistended/normal bowel sounds. No rebound or guarding.  Ext: no edema Skin: warm, dry    Assessment and Plan   #hypertension/CKD stage III S: medication:  amlodipine 5 mg--> 2.5 mg after lowers readings last visit, atenolol 50mg - half tablet twice daily. He later called and we stopped the amlodipine completely on 09/19/22. No shortness of breath. No exertional chest pain- very active and does not have pain with activity in chest- occasional some pain if pushes on left upper chest- believes has muscular strain (could reproduce on exam)   Home readings #s: even after he stopped the amlodipine completely on June 26th most blood pressure readings were in the 110s or 120s- he did have one reading as low as 100 but felt fine at the time. On 10/14/22 though he noted in the evening some spikes in blood pressure up to 166/97 he had eaten salty japanese food and had mountain dew- he took half amlodipine and pressures gradually came back down over next 12 hours. He otherwise has had no spikes in  blood pressure   GFR is typically-in the high 40's or 50's- 50's on last check  BP Readings from Last 3 Encounters:  10/16/22 124/78  08/06/22 120/72  02/09/22 109/73   A/P:For hypertension- blood pressure has improved despite stopping the amlodipine- he will continue the atenolol 50 mg- half tablet twice daily -he will use the amlodipine 2.5 mg only if blood pressure is above 135/85- then wait 1-2 hours before rechecking blood pressure  -update me in 2 weeks with home blood pressure   For CKD stage III-stable- continue current medicines   Recommended follow up: Return for next already scheduled visit or sooner if needed. Future Appointments  Date Time Provider Department Center   02/07/2023  1:00 PM Shelva Majestic, MD [facility]    Lab/Order associations:   ICD-10-CM   1. Essential hypertension  I10     2. Hyperlipidemia, unspecified hyperlipidemia type  E78.5       No orders of the defined types were placed in this encounter.   Return precautions advised.  Tana Conch, MD

## 2023-02-07 ENCOUNTER — Encounter: Payer: Self-pay | Admitting: Family Medicine

## 2023-02-07 ENCOUNTER — Ambulatory Visit (INDEPENDENT_AMBULATORY_CARE_PROVIDER_SITE_OTHER): Payer: Medicare Other | Admitting: Family Medicine

## 2023-02-07 VITALS — BP 122/86 | HR 67 | Temp 98.2°F | Ht 69.0 in | Wt 163.0 lb

## 2023-02-07 DIAGNOSIS — I10 Essential (primary) hypertension: Secondary | ICD-10-CM

## 2023-02-07 DIAGNOSIS — E785 Hyperlipidemia, unspecified: Secondary | ICD-10-CM | POA: Diagnosis not present

## 2023-02-07 DIAGNOSIS — N183 Chronic kidney disease, stage 3 unspecified: Secondary | ICD-10-CM | POA: Diagnosis not present

## 2023-02-07 DIAGNOSIS — F419 Anxiety disorder, unspecified: Secondary | ICD-10-CM

## 2023-02-07 DIAGNOSIS — Z125 Encounter for screening for malignant neoplasm of prostate: Secondary | ICD-10-CM | POA: Diagnosis not present

## 2023-02-07 DIAGNOSIS — Z0000 Encounter for general adult medical examination without abnormal findings: Secondary | ICD-10-CM

## 2023-02-07 NOTE — Patient Instructions (Addendum)
Health Maintenance Due  Topic Date Due   Medicare Annual Wellness (AWV)  03/17/2023  You are eligible to schedule your annual wellness visit with our nurse specialist Inetta Fermo.  Please consider scheduling this before you leave today  Ok to take amlodipine 2.5 mg if blood pressure above 145/90   Please stop by lab before you go If you have mychart- we will send your results within 3 business days of [place] receiving them.  If you do not have mychart- we will call you about results within 5 business days of [place] receiving them.  *please also note that you will see labs on mychart as soon as they post. I will later go in and write notes on them- will say "notes from Dr. Durene Cal"   Recommended follow up: Return in about 6 months (around 08/07/2023) for followup or sooner if needed.Schedule b4 you leave.

## 2023-02-07 NOTE — Progress Notes (Signed)
Phone: [phone]   Subjective:  Patient presents today for their annual physical. Chief complaint-noted.   See problem oriented charting- ROS- full  review of systems was completed and negative  except for: Some blood pressure fluctuations at home- takes amlodipine 2.5 mg if pressure over 145, also see chest pain discussion below, some increased urination at night  The following were reviewed and entered/updated in epic: Past Medical History:  Diagnosis Date   ANXIETY 11/07/2006   HYPERLIPIDEMIA 07/31/2007   HYPERTENSION 11/07/2006   OSTEOARTHRITIS 11/07/2006   Sleep apnea    hasn't used cpap for "10-12 years"   Patient Active Problem List   Diagnosis Date Noted   Precordial chest pain 06/03/2019    Priority: Medium    Chronic kidney disease (CKD), stage III (moderate) (HCC) 04/03/2019    Priority: Medium    Hyperlipidemia 07/31/2007    Priority: Medium    Insomnia 11/07/2006    Priority: Medium    Essential hypertension 11/07/2006    Priority: Medium    RBBB 04/23/2019    Priority: Low   History of adenomatous polyp of colon 04/13/2016    Priority: Low   Actinic keratosis 08/08/2015    Priority: Low   Plantar fasciitis of left foot 05/16/2011    Priority: Low   Osteoarthritis of right knee 11/07/2006    Priority: Low   Palpitations 12/28/2021   Anxiety 04/03/2019   Past Surgical History:  Procedure Laterality Date   CATARACT EXTRACTION     bilateral   COLONOSCOPY  04/09/2016   Dr.Stark-polyp   HERNIA REPAIR     02-17-2000 Dr [place] left groin; right Feb 17, 2011   KNEE ARTHROSCOPY Right 2007/02/17   POLYPECTOMY      Family History  Problem Relation Age of Onset   Heart disease Mother        pacer; MI late 41s or early 57s, CABG   Hyperlipidemia Mother    Hypertension Mother    Pneumonia Father        75- didnt bounce back. died February 16, 2021   Other Sister        MVC   Heart attack Brother        age 31, smoker   Colon cancer Neg Hx    Esophageal cancer Neg Hx    Stomach  cancer Neg Hx    Rectal cancer Neg Hx    Colon polyps Neg Hx     Medications- reviewed and updated Current Outpatient Medications  Medication Sig Dispense Refill   atenolol (TENORMIN) 50 MG tablet Take 0.5 tablets (25 mg total) by mouth 2 (two) times daily. 180 tablet 3   atorvastatin (LIPITOR) 20 MG tablet Take 1 tablet (20 mg total) by mouth daily. 90 tablet 3   diclofenac Sodium (VOLTAREN) 1 % GEL Apply 4 g topically as needed. 150 g 3   QUEtiapine (SEROQUEL) 25 MG tablet Take 0.5 tablets (12.5 mg total) by mouth at bedtime as needed. 45 tablet 3   amLODipine (NORVASC) 2.5 MG tablet Take 1 tablet (2.5 mg total) by mouth daily as needed (for blood pressure over 135/85). (Patient not taking: Reported on 02/07/2023) 30 tablet 5   diazepam (VALIUM) 5 MG tablet TAKE 1/2 TO 1 TABLET BY MOUTH EVERY DAY AS NEEDED FOR SLEEP (Patient not taking: Reported on 02/07/2023) 30 tablet 5   No current facility-administered medications for this visit.    Allergies-reviewed and updated No Known Allergies  Social History   Social History Narrative   Married 1977.  Children- Cara 1979. Arlys John 3474. 2 grandkids from [place]- Franky Macho 2005, [place] 2007.       Retired Programmer, multimedia    Objective  Objective:  BP 122/86   Pulse 67   Temp 98.2 F (36.8 C)   Ht 5\' 9"  (1.753 m)   Wt 163 lb (73.9 kg)   SpO2 99%   BMI 24.07 kg/m  Gen: NAD, resting comfortably HEENT: Mucous membranes are moist. Oropharynx normal Neck: no thyromegaly CV: RRR no murmurs rubs or gallops Lungs: CTAB no crackles, wheeze, rhonchi Abdomen: soft/nontender/nondistended/normal bowel sounds. No rebound or guarding.  Ext: no edema Skin: warm, dry Neuro: grossly normal, moves all extremities, PERRLA Rectal: normal tone,  slightly enlarged prostate, no masses or tenderness   Assessment and Plan  72 y.o. male presenting for annual physical.  Health Maintenance counseling: 1. Anticipatory guidance: Patient counseled  regarding regular dental exams -q6 months, eye exams -yearly,  avoiding smoking and second hand smoke , limiting alcohol to 2 beverages per day - doesn't drink, no illicit drugs .   2. Risk factor reduction:  Advised patient of need for regular exercise and diet rich and fruits and vegetables to reduce risk of heart attack and stroke.  Exercise- stays active but no intentional regular exercise.  Diet/weight management-weight down 10 pounds in the last year-last year had cut back on sodas-particular [employer] and [employer].  Wt Readings from Last 3 Encounters:  02/07/23 163 lb (73.9 kg)  10/16/22 166 lb (75.3 kg)  08/06/22 168 lb (76.2 kg)  3. Immunizations/screenings/ancillary studies-holding off on COVID shot, already had flu shot, opts out of RSV shot  Immunization History  Administered Date(s) Administered   Fluad Quad(high Dose 65+) 12/11/2018, 12/29/2019, 01/04/2022   Influenza Split 02/02/2011, 01/15/2012, 12/20/2017   Influenza Whole 12/20/2007, 01/27/2009, 03/01/2010   Influenza, High Dose Seasonal PF 02/09/2016, 12/25/2016, 12/20/2017, 12/30/2020, 01/07/2023   Influenza,inj,Quad PF,6+ Mos 12/26/2012, 12/31/2013, 01/03/2015   PFIZER(Purple Top)SARS-COV-2 Vaccination 05/01/2019, 05/22/2019, 01/18/2020   Pfizer Covid-19 Vaccine Bivalent Booster 21yrs & up 04/29/2021   Pneumococcal Conjugate-13 02/08/2015   Pneumococcal Polysaccharide-23 12/10/2013   Td 03/26/2000   Tdap 10/10/2011, 08/22/2018   Zoster Recombinant(Shingrix) 08/02/2017, 10/19/2017   Zoster, Live 03/01/2010   4. Prostate cancer screening- prefers to monitor with labs- update today. Some increased nocturia  Lab Results  Component Value Date   PSA 2.97 02/09/2022   PSA 2.79 02/07/2021   PSA 3.70 08/31/2020  5. Colon cancer screening - adenoma 2023 Dr. Russella Dar but no further follow-up due to age 53. Skin cancer screening- sees Dr. Margo Aye. advised regular sunscreen use. Denies worrisome, changing, or new skin lesions.    7. Smoking associated screening (lung cancer screening, AAA screen 65-75, UA)- never smoker 8. STD screening - only active with wife sexually   Status of chronic or acute concerns   #Left upper chest pain- can push on area and it hurts. Not exertional, not better with rest. Had similar pain about a year ago. Reports thinks he tweaked it with activity. Since this is so similar to last year and cardiology did not recommend further workup we will hold off BUT he agrees to see [place] back if worsening or has other new symptoms - also low risk stress test 06/12/2019 with Dr. Lacey Jensen noted the right bundle branch block makes more difficult to interpret -Recurrent pain in September 2023 leading to emergency department visit-pain thankfully resolved and Dr. Antoine Poche did not recommend further follow-up  #hypertension/CKD stage III S: medication:  atenolol 50mg - half tablet twice daily -Prior amlodipine 5 mg then 2.5 mg. Now he only uses that as needed if blood pressure over 145 Home readings #s: readings variable but most commonly 120s or 130s  GFR is typically-in the high 40's or 50's- 50 most recently  A/P: For hypertension- stable- continue current medicines. I'm ok with him taking a amlodipine 2.5 mg if blood pressure over 145 systolic For CKD stage III-has been stable- update kidney function today   #hyperlipidemia-LDL goal at least under 100.  Ideally under 70. S: Medication:Atorvastatin 20 mg Lab Results  Component Value Date   CHOL 98 02/09/2022   HDL 35.30 (L) 02/09/2022   LDLCALC 49 02/09/2022   LDLDIRECT 79.0 09/11/2018   TRIG 71.0 02/09/2022   CHOLHDL 3 02/09/2022  A/P: hopefully stable- update lipid panel today. Continue current meds for now    #Insomnia S: Medication: Seroquel 12.5 mg, diazepam 5 mg for sleep  A/P: doing reasonably well- continue current medications    #Primary osteoarthritis of right knee-worsening renal function on oral NSAIDs in the past.  I recommended  avoiding NSAIDs.  Has seen Dr. Magnus Ivan. PT helped some. steroid and gel injections not helpful- Voltaren gel some  -didn't do well with steroid shot- heart rate into 130s for a few days  Recommended follow up: Return in about 6 months (around 08/07/2023) for followup or sooner if needed.Schedule b4 you leave.  Lab/Order associations:NOT fasting- cheerios this morning   ICD-10-CM   1. Preventative health care  Z00.00     2. Essential hypertension  I10 TSH    3. Hyperlipidemia, unspecified hyperlipidemia type  E78.5 Comprehensive metabolic panel    CBC with Differential/Platelet    Lipid panel    TSH    4. Stage 3 chronic kidney disease, unspecified whether stage 3a or 3b CKD (HCC)  N18.30     5. Anxiety  F41.9     6. Screening for prostate cancer  Z12.5 PSA, Medicare ( [place] only)     No orders of the defined types were placed in this encounter.  Return precautions advised.  Tana Conch, MD

## 2023-02-08 ENCOUNTER — Other Ambulatory Visit: Payer: Self-pay | Admitting: Family Medicine

## 2023-02-08 DIAGNOSIS — D539 Nutritional anemia, unspecified: Secondary | ICD-10-CM

## 2023-02-08 DIAGNOSIS — N183 Chronic kidney disease, stage 3 unspecified: Secondary | ICD-10-CM

## 2023-02-08 LAB — COMPREHENSIVE METABOLIC PANEL
ALT: 21 U/L (ref 0–53)
AST: 22 U/L (ref 0–37)
Albumin: 4.5 g/dL (ref 3.5–5.2)
Alkaline Phosphatase: 54 U/L (ref 39–117)
BUN: 37 mg/dL — ABNORMAL HIGH (ref 6–23)
CO2: 27 meq/L (ref 19–32)
Calcium: 9.7 mg/dL (ref 8.4–10.5)
Chloride: 103 meq/L (ref 96–112)
Creatinine, Ser: 3.08 mg/dL — ABNORMAL HIGH (ref 0.40–1.50)
GFR: 19.26 mL/min — ABNORMAL LOW (ref >60.00)
Glucose, Bld: 79 mg/dL (ref 70–99)
Potassium: 5.1 meq/L (ref 3.5–5.1)
Sodium: 140 meq/L (ref 135–145)
Total Bilirubin: 1.1 mg/dL (ref 0.2–1.2)
Total Protein: 7.2 g/dL (ref 6.0–8.3)

## 2023-02-08 LAB — CBC WITH DIFFERENTIAL/PLATELET
Basophils Absolute: 0.1 10*3/uL (ref 0.0–0.1)
Basophils Relative: 1.1 % (ref 0.0–3.0)
Eosinophils Absolute: 0.1 10*3/uL (ref 0.0–0.7)
Eosinophils Relative: 1.8 % (ref 0.0–5.0)
HCT: 41 % (ref 39.0–52.0)
Hemoglobin: 13.8 g/dL (ref 13.0–17.0)
Lymphocytes Relative: 17.5 % (ref 12.0–46.0)
Lymphs Abs: 1.2 10*3/uL (ref 0.7–4.0)
MCHC: 33.6 g/dL (ref 30.0–36.0)
MCV: 100.4 fL — ABNORMAL HIGH (ref 78.0–100.0)
Monocytes Absolute: 0.5 10*3/uL (ref 0.1–1.0)
Monocytes Relative: 7.8 % (ref 3.0–12.0)
Neutro Abs: 5 10*3/uL (ref 1.4–7.7)
Neutrophils Relative %: 71.8 % (ref 43.0–77.0)
Platelets: 225 10*3/uL (ref 150.0–400.0)
RBC: 4.08 Mil/uL — ABNORMAL LOW (ref 4.22–5.81)
RDW: 13.6 % (ref 11.5–15.5)
WBC: 7 10*3/uL (ref 4.0–10.5)

## 2023-02-08 LAB — LIPID PANEL
Cholesterol: 102 mg/dL (ref 0–200)
HDL: 31.8 mg/dL — ABNORMAL LOW (ref >39.00)
LDL Cholesterol: 57 mg/dL (ref 0–99)
NonHDL: 70.27
Total CHOL/HDL Ratio: 3
Triglycerides: 68 mg/dL (ref 0.0–149.0)
VLDL: 13.6 mg/dL (ref 0.0–40.0)

## 2023-02-08 LAB — PSA, MEDICARE: PSA: 2.03 ng/mL (ref 0.10–4.00)

## 2023-02-08 LAB — TSH: TSH: 2.07 u[IU]/mL (ref 0.35–5.50)

## 2023-02-13 ENCOUNTER — Other Ambulatory Visit (INDEPENDENT_AMBULATORY_CARE_PROVIDER_SITE_OTHER): Payer: Medicare Other

## 2023-02-13 DIAGNOSIS — D539 Nutritional anemia, unspecified: Secondary | ICD-10-CM

## 2023-02-13 DIAGNOSIS — N183 Chronic kidney disease, stage 3 unspecified: Secondary | ICD-10-CM | POA: Diagnosis not present

## 2023-02-13 LAB — BASIC METABOLIC PANEL
BUN: 19 mg/dL (ref 6–23)
CO2: 25 meq/L (ref 19–32)
Calcium: 9 mg/dL (ref 8.4–10.5)
Chloride: 106 meq/L (ref 96–112)
Creatinine, Ser: 1.68 mg/dL — ABNORMAL HIGH (ref 0.40–1.50)
GFR: 39.85 mL/min — ABNORMAL LOW (ref >60.00)
Glucose, Bld: 98 mg/dL (ref 70–99)
Potassium: 4.1 meq/L (ref 3.5–5.1)
Sodium: 140 meq/L (ref 135–145)

## 2023-02-13 LAB — URINALYSIS, ROUTINE W REFLEX MICROSCOPIC
Bilirubin Urine: NEGATIVE
Hgb urine dipstick: NEGATIVE
Ketones, ur: NEGATIVE
Leukocytes,Ua: NEGATIVE
Nitrite: NEGATIVE
RBC / HPF: NONE SEEN (ref 0–?)
Specific Gravity, Urine: 1.015 (ref 1.000–1.030)
Total Protein, Urine: NEGATIVE
Urine Glucose: NEGATIVE
Urobilinogen, UA: 0.2 (ref 0.0–1.0)
pH: 6 (ref 5.0–8.0)

## 2023-02-13 LAB — VITAMIN B12: Vitamin B-12: 105 pg/mL — ABNORMAL LOW (ref 211–911)

## 2023-02-13 LAB — MICROALBUMIN / CREATININE URINE RATIO
Creatinine,U: 94 mg/dL
Microalb Creat Ratio: 4.9 mg/g (ref 0.0–30.0)
Microalb, Ur: 4.6 mg/dL — ABNORMAL HIGH (ref 0.0–1.9)

## 2023-02-14 ENCOUNTER — Other Ambulatory Visit: Payer: Self-pay

## 2023-02-14 DIAGNOSIS — R748 Abnormal levels of other serum enzymes: Secondary | ICD-10-CM

## 2023-02-15 LAB — FOLATE RBC: RBC Folate: 582 ng/mL (ref >280)

## 2023-02-24 ENCOUNTER — Other Ambulatory Visit: Payer: Self-pay | Admitting: Family Medicine

## 2023-03-01 ENCOUNTER — Telehealth: Payer: Self-pay | Admitting: Family Medicine

## 2023-03-01 NOTE — Telephone Encounter (Signed)
Prescription Request  03/01/2023  LOV: 02/07/2023  What is the name of the medication or equipment?  diazepam (VALIUM) 5 MG tablet --PT states pharmacist informed him she can't fill med due to not having any refills on file despite holding medication--could this mean she needs authorization?   Have you contacted your pharmacy to request a refill? Yes   Which pharmacy would you like this sent to?  [id number] [place], [place] - [address] [place] [address] Phone: [phone] Fax: [phone]   Patient notified that their request is being sent to the clinical staff for review and that they should receive a response within 2 business days.   Please advise at Mobile [phone] (mobile)

## 2023-03-04 MED ORDER — DIAZEPAM 5 MG PO TABS: ORAL_TABLET | ORAL | 5 refills | Status: DC

## 2023-03-07 ENCOUNTER — Other Ambulatory Visit: Payer: Self-pay | Admitting: Family Medicine

## 2023-03-15 ENCOUNTER — Other Ambulatory Visit (INDEPENDENT_AMBULATORY_CARE_PROVIDER_SITE_OTHER): Payer: Medicare Other

## 2023-03-15 DIAGNOSIS — R748 Abnormal levels of other serum enzymes: Secondary | ICD-10-CM | POA: Diagnosis not present

## 2023-03-15 LAB — BASIC METABOLIC PANEL
BUN: 17 mg/dL (ref 6–23)
CO2: 27 meq/L (ref 19–32)
Calcium: 8.9 mg/dL (ref 8.4–10.5)
Chloride: 106 meq/L (ref 96–112)
Creatinine, Ser: 1.34 mg/dL (ref 0.40–1.50)
GFR: 52.24 mL/min — ABNORMAL LOW (ref >60.00)
Glucose, Bld: 88 mg/dL (ref 70–99)
Potassium: 4.1 meq/L (ref 3.5–5.1)
Sodium: 140 meq/L (ref 135–145)

## 2023-04-02 ENCOUNTER — Other Ambulatory Visit: Payer: Self-pay | Admitting: Family Medicine

## 2023-04-03 ENCOUNTER — Other Ambulatory Visit: Payer: Self-pay | Admitting: Family Medicine

## 2023-05-01 ENCOUNTER — Ambulatory Visit: Payer: Medicare Other

## 2023-05-27 ENCOUNTER — Ambulatory Visit: Payer: Self-pay | Admitting: Family Medicine

## 2023-05-27 NOTE — Telephone Encounter (Signed)
 Copied from CRM [id number]. Topic: Clinical - Red Word Triage >> May 27, 2023  9:26 AM Theodis Sato wrote: Red Word that prompted transfer to Nurse Triage:  Blood pressure 140/103  Chief Complaint: elevated BP and HR  Symptoms: BP 140/103  Frequency: Sat am  Pertinent Negatives: Patient denies CP, abd pain, headache, dizziness or numbness Disposition: [] ED /[] Urgent Care (no appt availability in office) / [x] Appointment(In office/virtual)/ []  [facility]/ [] Home Care/ [] Refused Recommended Disposition /[] [facility]/ []  Follow-up with PCP Additional Notes: appt scheduled   Reason for Disposition  Systolic BP  >= 160 OR Diastolic >= 100  Answer Assessment - Initial Assessment Questions 1. BLOOD PRESSURE: "What is the blood pressure?" "Did you take at least two measurements 5 minutes apart?"     140/103 Sat   119/86 2. ONSET: "When did you take your blood pressure?"     Sat am  3. HOW: "How did you take your blood pressure?" (e.g., automatic home BP monitor, visiting nurse)     Auto BP 4. HISTORY: "Do you have a history of high blood pressure?"     yes 5. MEDICINES: "Are you taking any medicines for blood pressure?" "Have you missed any doses recently?"     Has been taking a lower dose PRN took 1 yest am and 1 at night  6. OTHER SYMPTOMS: "Do you have any symptoms?" (e.g., blurred vision, chest pain, difficulty breathing, headache, weakness)     HR 95 7. PREGNANCY: "Is there any chance you are pregnant?" "When was your last menstrual period?"     N/a  Protocols used: Blood Pressure - High-A-AH

## 2023-05-27 NOTE — Telephone Encounter (Signed)
 Noted.

## 2023-05-28 ENCOUNTER — Ambulatory Visit (INDEPENDENT_AMBULATORY_CARE_PROVIDER_SITE_OTHER): Admitting: Family Medicine

## 2023-05-28 ENCOUNTER — Encounter: Payer: Self-pay | Admitting: Family Medicine

## 2023-05-28 VITALS — BP 122/80 | HR 83 | Temp 98.4°F | Ht 69.0 in | Wt 165.4 lb

## 2023-05-28 DIAGNOSIS — I10 Essential (primary) hypertension: Secondary | ICD-10-CM | POA: Diagnosis not present

## 2023-05-28 DIAGNOSIS — N183 Chronic kidney disease, stage 3 unspecified: Secondary | ICD-10-CM | POA: Diagnosis not present

## 2023-05-28 NOTE — Progress Notes (Signed)
 Phone [phone] In person visit   Subjective:   Nathan Young is a 73 y.o. year old very pleasant male patient who presents for/with See problem oriented charting Chief Complaint  Patient presents with   Hypertension    Pt here to discuss elevated BP readings, and would like to elevated pulse readings,pt at times can feel his pulse beating in his chest, no chest pains or any other symptoms    Hyperlipidemia    Past Medical History-  Patient Active Problem List   Diagnosis Date Noted   Precordial chest pain 06/03/2019    Priority: Medium    Chronic kidney disease (CKD), stage III (moderate) (HCC) 04/03/2019    Priority: Medium    Hyperlipidemia 07/31/2007    Priority: Medium    Insomnia 11/07/2006    Priority: Medium    Essential hypertension 11/07/2006    Priority: Medium    RBBB 04/23/2019    Priority: Low   History of adenomatous polyp of colon 04/13/2016    Priority: Low   Actinic keratosis 08/08/2015    Priority: Low   Plantar fasciitis of left foot 05/16/2011    Priority: Low   Osteoarthritis of right knee 11/07/2006    Priority: Low   Palpitations 12/28/2021   Anxiety 04/03/2019    Medications- reviewed and updated Current Outpatient Medications  Medication Sig Dispense Refill   amLODipine (NORVASC) 2.5 MG tablet Take 1 tablet (2.5 mg total) by mouth daily as needed (for blood pressure over 135/85). 30 tablet 5   atenolol (TENORMIN) 50 MG tablet TAKE 0.5 TABLETS BY MOUTH 2 TIMES DAILY. 180 tablet 3   atorvastatin (LIPITOR) 20 MG tablet TAKE 1 TABLET BY MOUTH EVERY DAY 90 tablet 3   diazepam (VALIUM) 5 MG tablet TAKE 1/2 TO 1 TABLET BY MOUTH EVERY DAY AS NEEDED FOR SLEEP 30 tablet 5   diclofenac Sodium (VOLTAREN) 1 % GEL Apply 4 g topically as needed. 150 g 3   QUEtiapine (SEROQUEL) 25 MG tablet TAKE 0.5 TABLETS (12.5 MG TOTAL) BY MOUTH AT BEDTIME AS NEEDED. 45 tablet 3   No current facility-administered medications for this visit.     Objective:  BP  122/80   Pulse 83   Temp 98.4 F (36.9 C)   Ht 5\' 9"  (1.753 m)   Wt 165 lb 6.4 oz (75 kg)   SpO2 98%   BMI 24.43 kg/m  Gen: NAD, resting comfortably CV: RRR no murmurs rubs or gallops Lungs: CTAB no crackles, wheeze, rhonchi Ext: no edema Skin: warm, dry    Assessment and Plan   #upcoming eye procedure with optho - no chest pain or shortness of breath even with 2 flights of stairs - would be ok for this as long as blood pressure well controlled <135/85  #hypertension/CKD stage III S: medication:  atenolol 50mg - half tablet twice daily -Prior amlodipine 5 mg then 2.5 mg Home readings #s: on home reading 118/87 at 9:30 then checked again 10 minutes later 149/125 (took amlodipine 5 mg)  then on repeat 118/86 shortly after that. Intermittently that day would get back in 130s to 150's but also had readings as low as 97/78. Today has been really good 110s- 130s with some #s in low 100s- has not need amlodipine today- last took 2.5 mg Monday morning. Most recent blood pressure 108/73  GFR is typically-in the high 40's or 50's-52 last check  BP Readings from Last 3 Encounters:  05/28/23 122/80  02/07/23 122/86  10/16/22 124/78  A/P:  For hypertension- still with some variability but overall well controlled - not sure what caused spike on Saturday (advised to watch salt content) but he appropriately managed by taking his amlodipine which he can take if blood pressure >145/90 For CKD stage III-stable on last check- continue current medications    Recommended follow up: Return for next already scheduled visit or sooner if needed. Future Appointments  Date Time Provider Department Center  08/13/2023  9:00 AM Shelva Majestic, MD [facility]  02/10/2024  8:40 AM [facility] ANNUAL WELLNESS VISIT 1 [facility]  02/10/2024 10:00 AM Shelva Majestic, MD [facility]    Lab/Order associations:   ICD-10-CM   1. Essential hypertension  I10     2. Stage 3 chronic kidney disease, unspecified  whether stage 3a or 3b CKD (HCC)  N18.30       No orders of the defined types were placed in this encounter.   Return precautions advised.  Tana Conch, MD

## 2023-05-28 NOTE — Patient Instructions (Addendum)
 Blood pressure looks great in office- I wonder if either salt or stress caused the increase but you did a great job getting it back down- continue to use extra amlodiine if needed for blood pressure <145/90  Recommended follow up: Return for next already scheduled visit or sooner if needed.

## 2023-06-13 ENCOUNTER — Telehealth: Payer: Self-pay | Admitting: Family Medicine

## 2023-06-13 NOTE — Telephone Encounter (Signed)
 I discussed the microalbumin to creatinine lab error with patient that occurred with [place] labs.  Essentially the ratio was off by a factor of 10.  In this patient's individual case we had checked his microalbumin to creatinine ratio in November after he had a substantial bump in his kidney function-with hydration his kidney function improved but his microalbumin to creatinine ratio was mildly elevated at 49 with the adjustment.  We discussed at his follow-up visit in May likely switching him to low-dose angiotensin receptor blocker like valsartan then following up in 1 more month -We could make this change now but he had recent eye surgery and it will be easier for him to make the adjustment at a later date

## 2023-08-13 ENCOUNTER — Ambulatory Visit (INDEPENDENT_AMBULATORY_CARE_PROVIDER_SITE_OTHER): Payer: Medicare Other | Admitting: Family Medicine

## 2023-08-13 ENCOUNTER — Encounter: Payer: Self-pay | Admitting: Family Medicine

## 2023-08-13 ENCOUNTER — Ambulatory Visit: Payer: Self-pay | Admitting: Family Medicine

## 2023-08-13 VITALS — BP 120/72 | HR 70 | Temp 97.9°F | Ht 69.0 in | Wt 165.8 lb

## 2023-08-13 DIAGNOSIS — R809 Proteinuria, unspecified: Secondary | ICD-10-CM | POA: Diagnosis not present

## 2023-08-13 DIAGNOSIS — E538 Deficiency of other specified B group vitamins: Secondary | ICD-10-CM | POA: Diagnosis not present

## 2023-08-13 DIAGNOSIS — I10 Essential (primary) hypertension: Secondary | ICD-10-CM

## 2023-08-13 DIAGNOSIS — N183 Chronic kidney disease, stage 3 unspecified: Secondary | ICD-10-CM

## 2023-08-13 LAB — CBC WITH DIFFERENTIAL/PLATELET
Basophils Absolute: 0.1 10*3/uL (ref 0.0–0.1)
Basophils Relative: 0.8 % (ref 0.0–3.0)
Eosinophils Absolute: 0.2 10*3/uL (ref 0.0–0.7)
Eosinophils Relative: 3.1 % (ref 0.0–5.0)
HCT: 42.8 % (ref 39.0–52.0)
Hemoglobin: 14.6 g/dL (ref 13.0–17.0)
Lymphocytes Relative: 19.2 % (ref 12.0–46.0)
Lymphs Abs: 1.5 10*3/uL (ref 0.7–4.0)
MCHC: 34 g/dL (ref 30.0–36.0)
MCV: 97.1 fl (ref 78.0–100.0)
Monocytes Absolute: 0.6 10*3/uL (ref 0.1–1.0)
Monocytes Relative: 8.2 % (ref 3.0–12.0)
Neutro Abs: 5.3 10*3/uL (ref 1.4–7.7)
Neutrophils Relative %: 68.7 % (ref 43.0–77.0)
Platelets: 241 10*3/uL (ref 150.0–400.0)
RBC: 4.41 Mil/uL (ref 4.22–5.81)
RDW: 13.2 % (ref 11.5–15.5)
WBC: 7.7 10*3/uL (ref 4.0–10.5)

## 2023-08-13 LAB — COMPREHENSIVE METABOLIC PANEL WITH GFR
ALT: 15 U/L (ref 0–53)
AST: 20 U/L (ref 0–37)
Albumin: 4.5 g/dL (ref 3.5–5.2)
Alkaline Phosphatase: 46 U/L (ref 39–117)
BUN: 21 mg/dL (ref 6–23)
CO2: 23 meq/L (ref 19–32)
Calcium: 9.6 mg/dL (ref 8.4–10.5)
Chloride: 106 meq/L (ref 96–112)
Creatinine, Ser: 1.36 mg/dL (ref 0.40–1.50)
GFR: 51.17 mL/min — ABNORMAL LOW (ref >60.00)
Glucose, Bld: 95 mg/dL (ref 70–99)
Potassium: 4.8 meq/L (ref 3.5–5.1)
Sodium: 140 meq/L (ref 135–145)
Total Bilirubin: 1.4 mg/dL — ABNORMAL HIGH (ref 0.2–1.2)
Total Protein: 7.4 g/dL (ref 6.0–8.3)

## 2023-08-13 LAB — VITAMIN B12: Vitamin B-12: 429 pg/mL (ref 211–911)

## 2023-08-13 NOTE — Patient Instructions (Addendum)
 Please stop by lab before you go If you have mychart- we will send your results within 3 business days of us  receiving them.  If you do not have mychart- we will call you about results within 5 business days of us  receiving them.  *please also note that you will see labs on mychart as soon as they post. I will later go in and write notes on them- will say "notes from Dr. Arlene Ben"   Keep staying hydrated- your kidneys improved last visit- checking one other measure today in urine- if this test is high again may stop amlodipine  and use valsartan instead- it is a blood pressure medicine but we would be using it for kidney protection primarily and would recheck kidney function 2 weeks later to monitor  Recommended follow up: Return for next already scheduled visit or sooner if needed.

## 2023-08-13 NOTE — Progress Notes (Signed)
 Phone [phone] In person visit   Subjective:   Nathan Young is a 73 y.o. year old very pleasant male patient who presents for/with See problem oriented charting Chief Complaint  Patient presents with   Medical Management of Chronic Issues   Hypertension   Past Medical History-  Patient Active Problem List   Diagnosis Date Noted   Precordial chest pain 06/03/2019    Priority: Medium    Chronic kidney disease (CKD), stage III (moderate) (HCC) 04/03/2019    Priority: Medium    Hyperlipidemia 07/31/2007    Priority: Medium    Insomnia 11/07/2006    Priority: Medium    Essential hypertension 11/07/2006    Priority: Medium    RBBB 04/23/2019    Priority: Low   History of adenomatous polyp of colon 04/13/2016    Priority: Low   Actinic keratosis 08/08/2015    Priority: Low   Plantar fasciitis of left foot 05/16/2011    Priority: Low   Osteoarthritis of right knee 11/07/2006    Priority: Low   B12 deficiency 08/13/2023   Palpitations 12/28/2021   Anxiety 04/03/2019    Medications- reviewed and updated Current Outpatient Medications  Medication Sig Dispense Refill   amLODipine  (NORVASC ) 2.5 MG tablet Take 1 tablet (2.5 mg total) by mouth daily as needed (for blood pressure over 135/85). 30 tablet 5   atenolol  (TENORMIN ) 50 MG tablet TAKE 0.5 TABLETS BY MOUTH 2 TIMES DAILY. 180 tablet 3   atorvastatin  (LIPITOR) 20 MG tablet TAKE 1 TABLET BY MOUTH EVERY DAY 90 tablet 3   diazepam  (VALIUM ) 5 MG tablet TAKE 1/2 TO 1 TABLET BY MOUTH EVERY DAY AS NEEDED FOR SLEEP 30 tablet 5   diclofenac  Sodium (VOLTAREN ) 1 % GEL Apply 4 g topically as needed. 150 g 3   QUEtiapine  (SEROQUEL ) 25 MG tablet TAKE 0.5 TABLETS (12.5 MG TOTAL) BY MOUTH AT BEDTIME AS NEEDED. 45 tablet 3   No current facility-administered medications for this visit.     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 Vision has cleared up some and was told could take up to a year- has nitrous oxide bubble and has some precautions with that  #hypertension/CKD stage III S: medication:  atenolol  50mg - half tablet twice daily, amlodipine  2.5 mg- only needing if blood pressure goes up- but only 3-4x in recent months -UACR increased to 49 in November 2024 but this was after he had a short-term increase in creatinine up to 3 with later improvement back down to 1.34 Home readings #s: Occasionally elevated to 150 or 160 and takes amlodipine   GFR is typically-in the high 40's or 50s-50s on the last check in December A/P: For hypertension-well-controlled primarily on atenolol  50 mg half tablet twice daily with sparing amlodipine  if blood pressure elevates-continue current medication for now For CKD stage III-with possible proteinuria.  Mild elevation in UACR of 49 on last check but this was close to when he had a significant bump in creatinine that later improved-would like to recheck  this today but if still elevated over 30 likely changed to valsartan for renal protection and continue to monitor-if kidney function or microalbuminuria worsened could consider nephrology consult  #hyperlipidemia-LDL goal at least under 100.  Ideally under 70. S: Medication:Atorvastatin  20 mg Lab Results  Component Value Date   CHOL 102 02/07/2023   HDL 31.80 (L) 02/07/2023   LDLCALC 57 02/07/2023   LDLDIRECT 79.0 09/11/2018   TRIG 68.0 02/07/2023   CHOLHDL 3 02/07/2023  A/P: Low Density Lipoprotein (LDL cholesterol) well controlled continue current medications   # B12 deficiency S: Current treatment/medication (oral vs. IM): Did weekly injections for a  month and now on oral daily Lab Results  Component Value Date   VITAMINB12 105 (L) 02/13/2023   A/P: Update B12-hopefully maintaining on oral medication.  Also recheck CBC to see if macrocytosis has improved   Recommended follow up: Return for next already scheduled visit or sooner if needed. Future Appointments  Date Time Provider Department Center  02/10/2024  8:40 AM [facility] ANNUAL WELLNESS VISIT 1 [facility]  02/10/2024 10:00 AM Almira Jaeger, MD [facility]    Lab/Order associations:   ICD-10-CM   1. Essential hypertension  I10 Comprehensive metabolic panel with GFR    CBC with Differential/Platelet    Microalbumin / creatinine urine ratio    2. Proteinuria, unspecified type  R80.9 Microalbumin / creatinine urine ratio    3. B12 deficiency  E53.8 Vitamin B12    4. Stage 3 chronic kidney disease, unspecified whether stage 3a or 3b CKD (HCC)  N18.30       No orders of the defined types were placed in this encounter.   Return precautions advised.  Clarisa Crooked, MD

## 2023-08-14 LAB — MICROALBUMIN / CREATININE URINE RATIO
Creatinine,U: 181.9 mg/dL
Microalb Creat Ratio: 67.6 mg/g — ABNORMAL HIGH (ref 0.0–30.0)
Microalb, Ur: 12.3 mg/dL — ABNORMAL HIGH (ref 0.0–1.9)

## 2023-08-14 MED ORDER — VALSARTAN 40 MG PO TABS
40.0000 mg | ORAL_TABLET | Freq: Every day | ORAL | 3 refills | Status: DC
Start: 2023-08-14 — End: 2024-02-10

## 2023-08-15 ENCOUNTER — Telehealth: Payer: Self-pay

## 2023-08-15 NOTE — Telephone Encounter (Signed)
 See below.  Copied from CRM [id number]. Topic: General - Other >> Aug 14, 2023  3:48 PM Felizardo Hotter wrote: Reason for CRM: Pt called would like to know if he needs to stop taking QUEtiapine  (SEROQUEL ) 25 MG tablet when he starts taking valsartan (DIOVAN) 40 MG tablet. Please call pt at [phone].

## 2023-08-15 NOTE — Telephone Encounter (Signed)
 No can continue- did he read something about an interaction or something or is he just wanting to be sure?

## 2023-08-16 NOTE — Telephone Encounter (Signed)
 Called and lm on pt vm tcb, when pt cb please review below message with him.

## 2023-08-20 ENCOUNTER — Telehealth: Payer: Self-pay

## 2023-08-20 NOTE — Telephone Encounter (Signed)
 See below.  Copied from CRM [id number]. Topic: General - Other >> Aug 16, 2023 12:16 PM Howard Macho wrote: Patient called wanted to know if he should keep taking the atenolol  (TENORMIN ) 50 MG tablet and what time of the day to start taking the valsartan. Patient called stating when he called on 5/21 he believes the person put the wrong medication down that he was referring to when he was talking to them

## 2023-08-20 NOTE — Telephone Encounter (Signed)
 Yes continue atenolol  and he can take valsartan either in the morning or in the evening-I do not have strong preference-whichever is easier for him

## 2023-08-20 NOTE — Telephone Encounter (Signed)
 Please see prior documentation.  Copied from CRM [id number]. Topic: General - Other >> Aug 20, 2023 10:33 AM Kita Perish H wrote: Patient following up on message sent to office on 5/21 regarding medication please reach back out to patient.

## 2023-08-20 NOTE — Telephone Encounter (Signed)
Called and lm on pt vm. 

## 2023-09-06 ENCOUNTER — Ambulatory Visit: Payer: Self-pay

## 2023-09-06 NOTE — Telephone Encounter (Signed)
 1st attempt. Left message to call back  Message from [place] D sent at 09/06/2023  9:52 AM EDT  Copied From CRM [id number]. Reason for Triage: Wondering if diarrhea was a side effect of the medication valsartan  (DIOVAN ) 40 MG tablet. Patient has been having diarrhea almost every day since Monday.

## 2023-09-06 NOTE — Telephone Encounter (Signed)
 FYI Only or Action Required?: FYI only for provider  Patient was last seen in primary care on 08/13/2023 by Almira Jaeger, MD. Called Nurse Triage reporting Diarrhea. Symptoms began several days ago. Interventions attempted: Nothing. Symptoms are: stable.  Triage Disposition: See PCP Within 2 Weeks  Patient/caregiver understands and will follow disposition?: No, refuses disposition stated he has a follow up with PCP next week. Pt is concerned the Valsartan  could be the cause of his diarrhea.      Reason for Disposition  Diarrhea is a chronic symptom (recurrent or ongoing AND present > 4 weeks)  Answer Assessment - Initial Assessment Questions 1. DIARRHEA SEVERITY: How bad is the diarrhea? How many more stools have you had in the past 24 hours than normal?    - NO DIARRHEA (SCALE 0)   - MILD (SCALE 1-3): Few loose or mushy BMs; increase of 1-3 stools over normal daily number of stools; mild increase in ostomy output.   -  MODERATE (SCALE 4-7): Increase of 4-6 stools daily over normal; moderate increase in ostomy output.   -  SEVERE (SCALE 8-10; OR WORST POSSIBLE): Increase of 7 or more stools daily over normal; moderate increase in ostomy output; incontinence.     *No Answer* 2. ONSET: When did the diarrhea begin?      Monday  3. BM CONSISTENCY: How loose or watery is the diarrhea?      Watery 4. VOMITING: Are you also vomiting? If Yes, ask: How many times in the past 24 hours?      no 5. ABDOMEN PAIN: Are you having any abdomen pain? If Yes, ask: What does it feel like? (e.g., crampy, dull, intermittent, constant)      no 6. ABDOMEN PAIN SEVERITY: If present, ask: How bad is the pain?  (e.g., Scale 1-10; mild, moderate, or severe)   - MILD (1-3): doesn't interfere with normal activities, abdomen soft and not tender to touch    - MODERATE (4-7): interferes with normal activities or awakens from sleep, abdomen tender to touch    - SEVERE (8-10): excruciating  pain, doubled over, unable to do any normal activities       N/a 7. ORAL INTAKE: If vomiting, Have you been able to drink liquids? How much liquids have you had in the past 24 hours?     yes 8. HYDRATION: Any signs of dehydration? (e.g., dry mouth [not just dry lips], too weak to stand, dizziness, new weight loss) When did you last urinate?     no 9. EXPOSURE: Have you traveled to a foreign country recently? Have you been exposed to anyone with diarrhea? Could you have eaten any food that was spoiled?     Got [place] Sunday and diarrhea started 10. ANTIBIOTIC USE: Are you taking antibiotics now or have you taken antibiotics in the past 2 months?       Valsartan   11. OTHER SYMPTOMS: Do you have any other symptoms? (e.g., fever, blood in stool)       no  Protocols used: Diarrhea-A-AH

## 2023-09-13 ENCOUNTER — Ambulatory Visit (INDEPENDENT_AMBULATORY_CARE_PROVIDER_SITE_OTHER): Admitting: Family Medicine

## 2023-09-13 ENCOUNTER — Ambulatory Visit: Payer: Self-pay | Admitting: Family Medicine

## 2023-09-13 ENCOUNTER — Encounter: Payer: Self-pay | Admitting: Family Medicine

## 2023-09-13 VITALS — BP 120/74 | HR 77 | Temp 98.0°F | Ht 69.0 in | Wt 169.0 lb

## 2023-09-13 DIAGNOSIS — R7989 Other specified abnormal findings of blood chemistry: Secondary | ICD-10-CM

## 2023-09-13 DIAGNOSIS — I10 Essential (primary) hypertension: Secondary | ICD-10-CM

## 2023-09-13 DIAGNOSIS — G4700 Insomnia, unspecified: Secondary | ICD-10-CM

## 2023-09-13 DIAGNOSIS — N183 Chronic kidney disease, stage 3 unspecified: Secondary | ICD-10-CM | POA: Diagnosis not present

## 2023-09-13 LAB — COMPREHENSIVE METABOLIC PANEL WITH GFR
ALT: 17 U/L (ref 0–53)
AST: 19 U/L (ref 0–37)
Albumin: 4.2 g/dL (ref 3.5–5.2)
Alkaline Phosphatase: 40 U/L (ref 39–117)
BUN: 34 mg/dL — ABNORMAL HIGH (ref 6–23)
CO2: 26 meq/L (ref 19–32)
Calcium: 9.1 mg/dL (ref 8.4–10.5)
Chloride: 106 meq/L (ref 96–112)
Creatinine, Ser: 2.42 mg/dL — ABNORMAL HIGH (ref 0.40–1.50)
GFR: 25.61 mL/min — ABNORMAL LOW (ref >60.00)
Glucose, Bld: 98 mg/dL (ref 70–99)
Potassium: 4.5 meq/L (ref 3.5–5.1)
Sodium: 141 meq/L (ref 135–145)
Total Bilirubin: 0.8 mg/dL (ref 0.2–1.2)
Total Protein: 6.7 g/dL (ref 6.0–8.3)

## 2023-09-13 MED ORDER — DIAZEPAM 5 MG PO TABS: ORAL_TABLET | ORAL | 5 refills | Status: DC

## 2023-09-13 NOTE — Progress Notes (Signed)
 Phone [phone] In person visit   Subjective:   Nathan Young is a 73 y.o. year old very pleasant male patient who presents for/with See problem oriented charting Chief Complaint  Patient presents with   Follow-up    Discuss B/p and dizzy side effects     Past Medical History-  Patient Active Problem List   Diagnosis Date Noted   Precordial chest pain 06/03/2019    Priority: Medium    Chronic kidney disease (CKD), stage III (moderate) (HCC) 04/03/2019    Priority: Medium    Hyperlipidemia 07/31/2007    Priority: Medium    Insomnia 11/07/2006    Priority: Medium    Essential hypertension 11/07/2006    Priority: Medium    RBBB 04/23/2019    Priority: Low   History of adenomatous polyp of colon 04/13/2016    Priority: Low   Actinic keratosis 08/08/2015    Priority: Low   Plantar fasciitis of left foot 05/16/2011    Priority: Low   Osteoarthritis of right knee 11/07/2006    Priority: Low   B12 deficiency 08/13/2023   Palpitations 12/28/2021   Anxiety 04/03/2019    Medications- reviewed and updated Current Outpatient Medications  Medication Sig Dispense Refill   atenolol  (TENORMIN ) 50 MG tablet TAKE 0.5 TABLETS BY MOUTH 2 TIMES DAILY. 180 tablet 3   atorvastatin  (LIPITOR) 20 MG tablet TAKE 1 TABLET BY MOUTH EVERY DAY 90 tablet 3   diclofenac  Sodium (VOLTAREN ) 1 % GEL Apply 4 g topically as needed. 150 g 3   QUEtiapine  (SEROQUEL ) 25 MG tablet TAKE 0.5 TABLETS (12.5 MG TOTAL) BY MOUTH AT BEDTIME AS NEEDED. 45 tablet 3   valsartan  (DIOVAN ) 40 MG tablet Take 1 tablet (40 mg total) by mouth daily. 90 tablet 3   diazepam  (VALIUM ) 5 MG tablet TAKE 1/2 TO 1 TABLET BY MOUTH EVERY DAY AS NEEDED FOR SLEEP 30 tablet 5   No current facility-administered medications for this visit.     Objective:  BP 120/74   Pulse 77   Temp 98 F (36.7 C) (Temporal)   Ht 5' 9 (1.753 m)   Wt 169 lb (76.7 kg)   SpO2 93%   BMI 24.96 kg/m  Gen: NAD, resting comfortably CV: RRR no  murmurs rubs or gallops Lungs: CTAB no crackles, wheeze, rhonchi Ext: no edema Skin: warm, dry Neuro: No lightheadedness with standing today    Assessment and Plan   #hypertension/CKD stage III S: medication:  atenolol  50mg - half tablet twice daily, amlodipine  2.5 mg--> valsartan  40 mg--> 20 mg- reduced to see if itd make him less lightheaded and it helped -diarrhea a few days after [place] food and could have been that but also valsartan  dose have slight association with diarrhea -UACR increased to 49 in November 2024 but this was after he had a short-term increase in creatinine up to 3 with later improvement back down to 1.34 but UACR later increased to 67 and started valsartan  and stopped amlodipine   Home readings #s:  blood pressure under 100 systolic with full dose valsartan  but has been better on half dose in last few checks 104/67, 133/83, 127/73- and less lightheaded- feeling ebtter  GFR is typically-in the high 40's or 50's- 51 last check  A/P: For hypertension- blood pressure well controlled on atenolol  50 mg- half tablet in am and half tablet in pm and valsartan  20 mg daily (half of 40 mg) - continue current medications  For CKD stage III-update CMP today-especially to look for  any substantial worsening of creatinine of more than 30% on valsartan  in case of underlying renal artery stenosis    #Insomnia S: Medication: Seroquel  12.5 mg, diazepam  5 mg for sleep-often uses just half A/P: Patient has been on this combination for a long time and finds it effective-needs refill on diazepam -looks like he is filling this about every 2 months or 1-1/2 months-try to get away with half tablet of diazepam  if he can which I think is reasonable  Recommended follow up: Return for next already scheduled visit or sooner if needed. Future Appointments  Date Time Provider Department Center  02/10/2024  8:40 AM [facility] ANNUAL WELLNESS VISIT 1 [facility]  02/10/2024 10:00 AM Almira Jaeger,  MD [facility]    Lab/Order associations:   ICD-10-CM   1. Essential hypertension  I10 Comprehensive metabolic panel with GFR    2. Stage 3 chronic kidney disease, unspecified whether stage 3a or 3b CKD (HCC)  N18.30 Comprehensive metabolic panel with GFR    3. Insomnia, unspecified type  G47.00       Meds ordered this encounter  Medications   diazepam  (VALIUM ) 5 MG tablet    Sig: TAKE 1/2 TO 1 TABLET BY MOUTH EVERY DAY AS NEEDED FOR SLEEP    Dispense:  30 tablet    Refill:  5    This request is for a new prescription for a controlled substance as required by Federal/State law.    Return precautions advised.  Clarisa Crooked, MD

## 2023-09-13 NOTE — Patient Instructions (Addendum)
 blood pressure well controlled on atenolol  50 mg- half tablet in am and half tablet in pm and valsartan  20 mg daily (half of 40 mg) - continue current medications  -great job making adjustment down to half tablet of valsartan - that seems to be the best fit.   Refilled diazepam - you will be able to access refills through December 20th   Recommended follow up: Return for next already scheduled visit or sooner if needed.

## 2023-09-17 NOTE — Progress Notes (Signed)
 Results seen on mychart on 6/23

## 2023-10-07 ENCOUNTER — Ambulatory Visit (HOSPITAL_COMMUNITY)
Admission: RE | Admit: 2023-10-07 | Discharge: 2023-10-07 | Disposition: A | Discharge status: Home / Self Care | Source: Ambulatory Visit | Attending: Family Medicine | Admitting: Family Medicine

## 2023-10-07 ENCOUNTER — Ambulatory Visit: Payer: Self-pay | Admitting: Family Medicine

## 2023-10-07 DIAGNOSIS — R7989 Other specified abnormal findings of blood chemistry: Secondary | ICD-10-CM | POA: Insufficient documentation

## 2023-10-07 DIAGNOSIS — I10 Essential (primary) hypertension: Secondary | ICD-10-CM | POA: Insufficient documentation

## 2023-10-08 ENCOUNTER — Other Ambulatory Visit: Payer: Self-pay

## 2023-10-08 DIAGNOSIS — N183 Chronic kidney disease, stage 3 unspecified: Secondary | ICD-10-CM

## 2023-10-10 ENCOUNTER — Ambulatory Visit: Payer: Self-pay | Admitting: Family Medicine

## 2023-10-10 ENCOUNTER — Other Ambulatory Visit (INDEPENDENT_AMBULATORY_CARE_PROVIDER_SITE_OTHER)

## 2023-10-10 DIAGNOSIS — N183 Chronic kidney disease, stage 3 unspecified: Secondary | ICD-10-CM

## 2023-10-10 LAB — COMPREHENSIVE METABOLIC PANEL WITH GFR
ALT: 16 U/L (ref 0–53)
AST: 19 U/L (ref 0–37)
Albumin: 4.4 g/dL (ref 3.5–5.2)
Alkaline Phosphatase: 43 U/L (ref 39–117)
BUN: 15 mg/dL (ref 6–23)
CO2: 27 meq/L (ref 19–32)
Calcium: 9.2 mg/dL (ref 8.4–10.5)
Chloride: 103 meq/L (ref 96–112)
Creatinine, Ser: 1.45 mg/dL (ref 0.40–1.50)
GFR: 47.33 mL/min — ABNORMAL LOW (ref >60.00)
Glucose, Bld: 93 mg/dL (ref 70–99)
Potassium: 4.1 meq/L (ref 3.5–5.1)
Sodium: 138 meq/L (ref 135–145)
Total Bilirubin: 0.9 mg/dL (ref 0.2–1.2)
Total Protein: 7 g/dL (ref 6.0–8.3)

## 2023-11-08 ENCOUNTER — Ambulatory Visit (INDEPENDENT_AMBULATORY_CARE_PROVIDER_SITE_OTHER): Admitting: Physician Assistant

## 2023-11-08 VITALS — BP 115/79 | HR 88

## 2023-11-08 DIAGNOSIS — R300 Dysuria: Secondary | ICD-10-CM

## 2023-11-08 LAB — POCT URINALYSIS DIPSTICK
Bilirubin, UA: NEGATIVE
Blood, UA: POSITIVE — AB
Glucose, UA: NEGATIVE
Ketones, UA: NEGATIVE
Nitrite, UA: POSITIVE — AB
Protein, UA: POSITIVE — AB
Spec Grav, UA: >=1.03 — AB (ref 1.010–1.025)
Urobilinogen, UA: 0.2 U/dL
pH, UA: 6 (ref 5.0–8.0)

## 2023-11-08 MED ORDER — CIPROFLOXACIN HCL 500 MG PO TABS: 500.0000 mg | ORAL_TABLET | Freq: Two times a day (BID) | ORAL | 0 refills | Status: AC

## 2023-11-08 NOTE — Progress Notes (Signed)
 Patient ID: Nathan Young, male    DOB: [birth year], 73 y.o.   MRN: [id number]   Assessment & Plan:  Dysuria -     POCT urinalysis dipstick -     Urine Culture  Other orders -     Ciprofloxacin HCl; Take 1 tablet (500 mg total) by mouth 2 (two) times daily for 5 days.  Dispense: 10 tablet; Refill: 0    Assessment & Plan Urinary tract infection Recurrent urinary tract infection with dysuria, urgency, and back pain. No fever. Urinalysis reveals hematuria, proteinuria, and significant bacteriuria, indicating active infection. Previous episodes successfully treated with ciprofloxacin. No hospitalization history for UTIs. Differential includes possible kidney involvement, but no current evidence of renal pain or fever. - Prescribe ciprofloxacin 500 mg twice daily for 5 days. - Order urine culture to identify causative bacteria. - Recommend maintaining hydration. - Recommend acetaminophen for pain management as needed. - Suggest phenazopyridine over-the-counter for symptomatic relief. - Instruct to seek emergency care if high fever develops, anuria occurs, or symptoms worsen over the weekend. - Follow up next week if symptoms do not improve.      Return if symptoms worsen or fail to improve.    Subjective:    Chief Complaint  Patient presents with   Dysuria    Pt c/o dysuria, headaches and groin pain, present for 1 week Has tried tylenol.     Dysuria    Discussed the use of AI scribe software for clinical note transcription with the patient, who gave verbal consent to proceed.  History of Present Illness Nathan Young is a 73 year old male who presents with symptoms suggestive of a urinary tract infection (UTI).  He has been experiencing pelvic pain and pressure, particularly in the back below the belt line, along with significant burning during urination and a frequent urge to urinate. These symptoms began last week and have been gradually worsening. No  fever or kidney pain. His back pain initially was attributed to inactivity.  He has a history of urinary tract infections, with the last episode occurring three years ago, treated successfully with Ciprofloxacin. He has not been hospitalized for a UTI before and typically responds well to antibiotics.  He has been trying to stay hydrated but notes that excessive water intake makes him feel bloated. He is unsure if there is blood in his urine but notes that it appeared cloudy. He has previously taken AZO for pain relief.  No swelling in the legs and he remains active, spending a lot of time outdoors.     Past Medical History:  Diagnosis Date   ANXIETY 11/07/2006   HYPERLIPIDEMIA 07/31/2007   HYPERTENSION 11/07/2006   OSTEOARTHRITIS 11/07/2006   Sleep apnea    hasn't used cpap for 10-12 years    Past Surgical History:  Procedure Laterality Date   CATARACT EXTRACTION     bilateral   COLONOSCOPY  04/09/2016   Dr.Stark-polyp   HERNIA REPAIR     1999/12/09 Dr Velinda Moats left groin; right 12/09/10   KNEE ARTHROSCOPY Right Dec 09, 2006   POLYPECTOMY      Family History  Problem Relation Age of Onset   Heart disease Mother        pacer; MI late 18s or early 45s, CABG   Hyperlipidemia Mother    Hypertension Mother    Pneumonia Father        45- didnt bounce back. died 12/08/2020   Other Sister  MVC   Heart attack Brother        age 64, smoker   Colon cancer Neg Hx    Esophageal cancer Neg Hx    Stomach cancer Neg Hx    Rectal cancer Neg Hx    Colon polyps Neg Hx     Social History   Tobacco Use   Smoking status: Never   Smokeless tobacco: Never  Vaping Use   Vaping status: Never Used  Substance Use Topics   Alcohol use: No   Drug use: No     No Known Allergies  Review of Systems  Genitourinary:  Positive for dysuria.   NEGATIVE UNLESS OTHERWISE INDICATED IN HPI      Objective:     BP 115/79   Pulse 88   SpO2 96%   Wt Readings from Last 3 Encounters:  09/13/23 169 lb  (76.7 kg)  08/13/23 165 lb 12.8 oz (75.2 kg)  05/28/23 165 lb 6.4 oz (75 kg)    BP Readings from Last 3 Encounters:  11/08/23 115/79  09/13/23 120/74  08/13/23 120/72     Physical Exam Vitals and nursing note reviewed.  Constitutional:      Appearance: Normal appearance.  Cardiovascular:     Rate and Rhythm: Normal rate and regular rhythm.     Pulses: Normal pulses.  Pulmonary:     Effort: Pulmonary effort is normal.     Breath sounds: Normal breath sounds.  Abdominal:     General: Abdomen is flat. Bowel sounds are normal. There is no distension.     Palpations: Abdomen is soft.     Tenderness: There is no abdominal tenderness. There is no right CVA tenderness, left CVA tenderness, guarding or rebound.  Musculoskeletal:     Right lower leg: No edema.     Left lower leg: No edema.  Skin:    Findings: No rash.  Neurological:     General: No focal deficit present.     Mental Status: He is alert.  Psychiatric:        Mood and Affect: Mood normal.             Susi Goslin M Limmie Schoenberg, PA-C

## 2023-11-08 NOTE — Patient Instructions (Signed)
  VISIT SUMMARY: You visited us  today due to symptoms of a urinary tract infection (UTI), including pelvic pain, burning during urination, and frequent urges to urinate. Your urinalysis confirmed an active infection.  YOUR PLAN: URINARY TRACT INFECTION: You have a recurrent urinary tract infection with symptoms like burning during urination, urgency, and back pain. Your urinalysis showed signs of infection. -Take ciprofloxacin 500 mg twice daily for 5 days. -A urine culture has been ordered to identify the bacteria causing the infection. -Stay hydrated, but avoid excessive water intake if it makes you feel bloated. -You can take acetaminophen for pain management as needed. -Consider using phenazopyridine, an over-the-counter medication, for symptom relief. -Seek emergency care if you develop a high fever, cannot urinate, or if your symptoms worsen over the weekend. -Follow up next week if your symptoms do not improve.                      Contains text generated by [employer]bridge.                                 Contains text generated by Abridge.

## 2023-11-10 LAB — URINE CULTURE
MICRO NUMBER:: 16838231
SPECIMEN QUALITY:: ADEQUATE

## 2023-11-11 ENCOUNTER — Ambulatory Visit: Payer: Self-pay | Admitting: Physician Assistant

## 2024-01-13 ENCOUNTER — Ambulatory Visit: Payer: Self-pay

## 2024-01-13 ENCOUNTER — Ambulatory Visit: Payer: Self-pay | Admitting: Physician Assistant

## 2024-01-13 ENCOUNTER — Encounter: Payer: Self-pay | Admitting: Physician Assistant

## 2024-01-13 ENCOUNTER — Ambulatory Visit: Admitting: Physician Assistant

## 2024-01-13 VITALS — BP 124/84 | HR 72 | Temp 98.2°F | Ht 69.0 in | Wt 169.2 lb

## 2024-01-13 DIAGNOSIS — R350 Frequency of micturition: Secondary | ICD-10-CM

## 2024-01-13 DIAGNOSIS — R809 Proteinuria, unspecified: Secondary | ICD-10-CM | POA: Diagnosis not present

## 2024-01-13 DIAGNOSIS — N183 Chronic kidney disease, stage 3 unspecified: Secondary | ICD-10-CM

## 2024-01-13 LAB — URINALYSIS, ROUTINE W REFLEX MICROSCOPIC
Bilirubin Urine: NEGATIVE
Hgb urine dipstick: NEGATIVE
Ketones, ur: NEGATIVE
Nitrite: NEGATIVE
RBC / HPF: NONE SEEN (ref 0–?)
Specific Gravity, Urine: 1.015 (ref 1.000–1.030)
Total Protein, Urine: 30 — AB
Urine Glucose: NEGATIVE
Urobilinogen, UA: 1 (ref 0.0–1.0)
pH: 6 (ref 5.0–8.0)

## 2024-01-13 LAB — COMPREHENSIVE METABOLIC PANEL WITH GFR
ALT: 14 U/L (ref 0–53)
AST: 18 U/L (ref 0–37)
Albumin: 4.6 g/dL (ref 3.5–5.2)
Alkaline Phosphatase: 43 U/L (ref 39–117)
BUN: 17 mg/dL (ref 6–23)
CO2: 28 meq/L (ref 19–32)
Calcium: 9.8 mg/dL (ref 8.4–10.5)
Chloride: 103 meq/L (ref 96–112)
Creatinine, Ser: 1.39 mg/dL (ref 0.40–1.50)
GFR: 49.7 mL/min — ABNORMAL LOW (ref >60.00)
Glucose, Bld: 85 mg/dL (ref 70–99)
Potassium: 4.3 meq/L (ref 3.5–5.1)
Sodium: 140 meq/L (ref 135–145)
Total Bilirubin: 0.9 mg/dL (ref 0.2–1.2)
Total Protein: 7.2 g/dL (ref 6.0–8.3)

## 2024-01-13 LAB — POCT URINALYSIS DIPSTICK
Bilirubin, UA: NEGATIVE
Blood, UA: NEGATIVE
Glucose, UA: NEGATIVE
Ketones, UA: NEGATIVE
Leukocytes, UA: NEGATIVE
Nitrite, UA: NEGATIVE
Protein, UA: POSITIVE — AB
Spec Grav, UA: 1.02 (ref 1.010–1.025)
Urobilinogen, UA: 0.2 U/dL
pH, UA: 6 (ref 5.0–8.0)

## 2024-01-13 NOTE — Telephone Encounter (Signed)
 FYI Only or Action Required?: FYI only for provider.  Patient was last seen in primary care on 11/08/2023 by Allwardt, Mardy HERO, PA-C.  Called Nurse Triage reporting Foamy Urine.  Symptoms began several weeks ago.  Interventions attempted: Nothing.  Symptoms are: gradually worsening.  Triage Disposition: See Today in Office (overriding See PCP Within 2 Weeks)  Patient/caregiver understands and will follow disposition?:   Reason for Disposition  All other urine symptoms  Answer Assessment - Initial Assessment Questions 1. SYMPTOM: What's the main symptom you're concerned about? (e.g., frequency, incontinence)     Foamy urine  2. ONSET:      2 weeks ago  3. PAIN: Is there any pain? If Yes, ask: How bad is it? (Scale: 1-10; mild, moderate, severe)     Denies  4. CAUSE: What do you think is causing the symptoms?     Hx of proteinuria, CKD and recent UTI  5. OTHER SYMPTOMS: Do you have any other symptoms? (e.g., blood in urine, fever, flank pain, pain with urination)     Denies  Protocols used: Urinary Symptoms-A-AH   Message from Sheridan F sent at 01/13/2024  9:36 AM EDT  Summary: Foamy urine   Reason for Triage: Patient states he has foam in the urine a couple of weeks ago. He would like some clinical advice on weather or not he should come in for this. No other symptoms noted.  Patient callback is [phone] or mobile [phone] (Cell)

## 2024-01-13 NOTE — Progress Notes (Unsigned)
    Patient ID: Nathan Young, male    DOB: March 31, 1950, 73 y.o.   MRN: [id number]   Assessment & Plan:  There are no diagnoses linked to this encounter.    Assessment and Plan Assessment & Plan       No follow-ups on file.    Subjective:    Chief Complaint  Patient presents with   Urinary Tract Infection    Pt had UTI a few months ago; pt not having any urinary symptoms other than nocturnal frequency;     HPI Discussed the use of AI scribe software for clinical note transcription with the patient, who gave verbal consent to proceed.  History of Present Illness      Past Medical History:  Diagnosis Date   ANXIETY 11/07/2006   HYPERLIPIDEMIA 07/31/2007   HYPERTENSION 11/07/2006   OSTEOARTHRITIS 11/07/2006   Sleep apnea    hasn't used cpap for 10-12 years    Past Surgical History:  Procedure Laterality Date   CATARACT EXTRACTION     bilateral   COLONOSCOPY  04/09/2016   Dr.Stark-polyp   HERNIA REPAIR     2000-02-14 Dr Velinda Moats left groin; right February 14, 2011   KNEE ARTHROSCOPY Right 2007/02/14   POLYPECTOMY      Family History  Problem Relation Age of Onset   Heart disease Mother        pacer; MI late 78s or early 7s, CABG   Hyperlipidemia Mother    Hypertension Mother    Pneumonia Father        63- didnt bounce back. died 02/13/21   Other Sister        MVC   Heart attack Brother        age 84, smoker   Colon cancer Neg Hx    Esophageal cancer Neg Hx    Stomach cancer Neg Hx    Rectal cancer Neg Hx    Colon polyps Neg Hx     Social History   Tobacco Use   Smoking status: Never   Smokeless tobacco: Never  Vaping Use   Vaping status: Never Used  Substance Use Topics   Alcohol use: No   Drug use: No     No Known Allergies  Review of Systems NEGATIVE UNLESS OTHERWISE INDICATED IN HPI      Objective:     BP 124/84 (BP Location: Left Arm, Patient Position: Sitting, Cuff Size: Normal)   Pulse 72   Temp 98.2 F (36.8 C) (Temporal)   Ht 5' 9 (1.753  m)   Wt 169 lb 3.2 oz (76.7 kg)   SpO2 99%   BMI 24.99 kg/m   Wt Readings from Last 3 Encounters:  01/13/24 169 lb 3.2 oz (76.7 kg)  09/13/23 169 lb (76.7 kg)  08/13/23 165 lb 12.8 oz (75.2 kg)    BP Readings from Last 3 Encounters:  01/13/24 124/84  11/08/23 115/79  09/13/23 120/74     Physical Exam          Tam Delisle M Abdulraheem Pineo, PA-C

## 2024-01-13 NOTE — Telephone Encounter (Signed)
 Appt today

## 2024-02-10 ENCOUNTER — Ambulatory Visit (INDEPENDENT_AMBULATORY_CARE_PROVIDER_SITE_OTHER): Payer: Medicare Other

## 2024-02-10 ENCOUNTER — Encounter: Payer: Self-pay | Admitting: Family Medicine

## 2024-02-10 ENCOUNTER — Ambulatory Visit: Payer: Self-pay | Admitting: Family Medicine

## 2024-02-10 ENCOUNTER — Ambulatory Visit: Payer: Medicare Other | Admitting: Family Medicine

## 2024-02-10 VITALS — BP 135/86 | HR 71 | Ht 69.0 in | Wt 169.0 lb

## 2024-02-10 VITALS — BP 138/82 | HR 73 | Temp 98.4°F | Ht 69.0 in | Wt 165.0 lb

## 2024-02-10 DIAGNOSIS — Z125 Encounter for screening for malignant neoplasm of prostate: Secondary | ICD-10-CM | POA: Diagnosis not present

## 2024-02-10 DIAGNOSIS — Z0000 Encounter for general adult medical examination without abnormal findings: Secondary | ICD-10-CM | POA: Diagnosis not present

## 2024-02-10 DIAGNOSIS — N183 Chronic kidney disease, stage 3 unspecified: Secondary | ICD-10-CM

## 2024-02-10 DIAGNOSIS — I10 Essential (primary) hypertension: Secondary | ICD-10-CM

## 2024-02-10 DIAGNOSIS — R809 Proteinuria, unspecified: Secondary | ICD-10-CM

## 2024-02-10 DIAGNOSIS — E785 Hyperlipidemia, unspecified: Secondary | ICD-10-CM | POA: Diagnosis not present

## 2024-02-10 DIAGNOSIS — E538 Deficiency of other specified B group vitamins: Secondary | ICD-10-CM

## 2024-02-10 LAB — CBC WITH DIFFERENTIAL/PLATELET
Basophils Absolute: 0 K/uL (ref 0.0–0.1)
Basophils Relative: 1 % (ref 0.0–3.0)
Eosinophils Absolute: 0.1 K/uL (ref 0.0–0.7)
Eosinophils Relative: 1.5 % (ref 0.0–5.0)
HCT: 40.3 % (ref 39.0–52.0)
Hemoglobin: 13.7 g/dL (ref 13.0–17.0)
Lymphocytes Relative: 20.7 % (ref 12.0–46.0)
Lymphs Abs: 1.1 K/uL (ref 0.7–4.0)
MCHC: 33.9 g/dL (ref 30.0–36.0)
MCV: 98.3 fl (ref 78.0–100.0)
Monocytes Absolute: 0.4 K/uL (ref 0.1–1.0)
Monocytes Relative: 7.7 % (ref 3.0–12.0)
Neutro Abs: 3.5 K/uL (ref 1.4–7.7)
Neutrophils Relative %: 69.1 % (ref 43.0–77.0)
Platelets: 232 K/uL (ref 150.0–400.0)
RBC: 4.1 Mil/uL — ABNORMAL LOW (ref 4.22–5.81)
RDW: 13.7 % (ref 11.5–15.5)
WBC: 5.1 K/uL (ref 4.0–10.5)

## 2024-02-10 LAB — COMPREHENSIVE METABOLIC PANEL WITH GFR
ALT: 16 U/L (ref 0–53)
AST: 18 U/L (ref 0–37)
Albumin: 4.4 g/dL (ref 3.5–5.2)
Alkaline Phosphatase: 46 U/L (ref 39–117)
BUN: 15 mg/dL (ref 6–23)
CO2: 24 meq/L (ref 19–32)
Calcium: 9.3 mg/dL (ref 8.4–10.5)
Chloride: 104 meq/L (ref 96–112)
Creatinine, Ser: 1.38 mg/dL (ref 0.40–1.50)
GFR: 50.11 mL/min — ABNORMAL LOW (ref >60.00)
Glucose, Bld: 97 mg/dL (ref 70–99)
Potassium: 4.6 meq/L (ref 3.5–5.1)
Sodium: 139 meq/L (ref 135–145)
Total Bilirubin: 0.9 mg/dL (ref 0.2–1.2)
Total Protein: 6.9 g/dL (ref 6.0–8.3)

## 2024-02-10 LAB — LIPID PANEL
Cholesterol: 104 mg/dL (ref 0–200)
HDL: 37.8 mg/dL — ABNORMAL LOW (ref >39.00)
LDL Cholesterol: 53 mg/dL (ref 0–99)
NonHDL: 66.42
Total CHOL/HDL Ratio: 3
Triglycerides: 65 mg/dL (ref 0.0–149.0)
VLDL: 13 mg/dL (ref 0.0–40.0)

## 2024-02-10 LAB — MICROALBUMIN / CREATININE URINE RATIO
Creatinine,U: 77.8 mg/dL
Microalb Creat Ratio: 54.8 mg/g — ABNORMAL HIGH (ref 0.0–30.0)
Microalb, Ur: 4.3 mg/dL — ABNORMAL HIGH (ref 0.0–1.9)

## 2024-02-10 LAB — VITAMIN B12: Vitamin B-12: 427 pg/mL (ref 211–911)

## 2024-02-10 LAB — PSA, MEDICARE: PSA: 8.08 ng/mL — ABNORMAL HIGH (ref 0.10–4.00)

## 2024-02-10 MED ORDER — AMLODIPINE BESYLATE 2.5 MG PO TABS: 2.5000 mg | ORAL_TABLET | Freq: Every day | ORAL | 3 refills | Status: DC

## 2024-02-10 NOTE — Patient Instructions (Signed)
 Mr. Graefe,  Thank you for taking the time for your Medicare Wellness Visit. I appreciate your continued commitment to your health goals. Please review the care plan we discussed, and feel free to reach out if I can assist you further.  Please note that Annual Wellness Visits do not include a physical exam. Some assessments may be limited, especially if the visit was conducted virtually. If needed, we may recommend an in-person follow-up with your provider.  Ongoing Care Seeing your primary care provider every 3 to 6 months helps us  monitor your health and provide consistent, personalized care.   Referrals If a referral was made during today's visit and you haven't received any updates within two weeks, please contact the referred provider directly to check on the status.  Recommended Screenings:  Health Maintenance  Topic Date Due   Medicare Annual Wellness Visit  03/17/2023   COVID-19 Vaccine (5 - 2025-26 season) 11/25/2023   Hepatitis C Screening  03/22/2098*   DTaP/Tdap/Td vaccine (4 - Td or Tdap) 08/21/2028   Pneumococcal Vaccine for age over 55  Completed   Flu Shot  Completed   Zoster (Shingles) Vaccine  Completed   Meningitis B Vaccine  Aged Out   Colon Cancer Screening  Discontinued  *Topic was postponed. The date shown is not the original due date.       02/10/2024    8:47 AM  Advanced Directives  Does Patient Have a Medical Advance Directive? No  Would patient like information on creating a medical advance directive? No - Patient declined    Vision: Annual vision screenings are recommended for early detection of glaucoma, cataracts, and diabetic retinopathy. These exams can also reveal signs of chronic conditions such as diabetes and high blood pressure.  Dental: Annual dental screenings help detect early signs of oral cancer, gum disease, and other conditions linked to overall health, including heart disease and diabetes.  Please see the attached documents for  additional preventive care recommendations.

## 2024-02-10 NOTE — Progress Notes (Signed)
 Phone: [phone]   Subjective:  Patient presents today for their annual physical. Chief complaint-noted.   See problem oriented charting- ROS- full  review of systems was completed and negative  except for topics noted under acute/chronic concerns  The following were reviewed and entered/updated in epic: Past Medical History:  Diagnosis Date   ANXIETY 11/07/2006   HYPERLIPIDEMIA 07/31/2007   HYPERTENSION 11/07/2006   OSTEOARTHRITIS 11/07/2006   Sleep apnea    hasn't used cpap for 10-12 years   Patient Active Problem List   Diagnosis Date Noted   Precordial chest pain 06/03/2019    Priority: Medium    Chronic kidney disease (CKD), stage III (moderate) (HCC) 04/03/2019    Priority: Medium    Hyperlipidemia 07/31/2007    Priority: Medium    Insomnia 11/07/2006    Priority: Medium    Essential hypertension 11/07/2006    Priority: Medium    RBBB 04/23/2019    Priority: Low   History of adenomatous polyp of colon 04/13/2016    Priority: Low   Actinic keratosis 08/08/2015    Priority: Low   Plantar fasciitis of left foot 05/16/2011    Priority: Low   Osteoarthritis of right knee 11/07/2006    Priority: Low   B12 deficiency 08/13/2023   Palpitations 12/28/2021   Anxiety 04/03/2019   Past Surgical History:  Procedure Laterality Date   CATARACT EXTRACTION     bilateral   COLONOSCOPY  04/09/2016   Dr.Stark-polyp   HERNIA REPAIR     March 12, 2000 Dr Velinda Moats left groin; right 2011-03-13   KNEE ARTHROSCOPY Right 03-13-07   POLYPECTOMY      Family History  Problem Relation Age of Onset   Heart disease Mother        pacer; MI late 78s or early 87s, CABG   Hyperlipidemia Mother    Hypertension Mother    Pneumonia Father        46- didnt bounce back. died Mar 12, 2021   Other Sister        MVC   Heart attack Brother        age 36, smoker   Colon cancer Neg Hx    Esophageal cancer Neg Hx    Stomach cancer Neg Hx    Rectal cancer Neg Hx    Colon polyps Neg Hx    Medications- reviewed  and updated Current Outpatient Medications  Medication Sig Dispense Refill   atenolol  (TENORMIN ) 50 MG tablet TAKE 0.5 TABLETS BY MOUTH 2 TIMES DAILY. 180 tablet 3   atorvastatin  (LIPITOR) 20 MG tablet TAKE 1 TABLET BY MOUTH EVERY DAY 90 tablet 3   Cranberry-Vitamin C-Probiotic (AZO CRANBERRY) 250-30 MG TABS Take 1 tablet by mouth daily.     diazepam  (VALIUM ) 5 MG tablet TAKE 1/2 TO 1 TABLET BY MOUTH EVERY DAY AS NEEDED FOR SLEEP 30 tablet 5   diclofenac  Sodium (VOLTAREN ) 1 % GEL Apply 4 g topically as needed. 150 g 3   QUEtiapine  (SEROQUEL ) 25 MG tablet TAKE 0.5 TABLETS (12.5 MG TOTAL) BY MOUTH AT BEDTIME AS NEEDED. 45 tablet 3   No current facility-administered medications for this visit.    Allergies-reviewed and updated No Known Allergies  Social History   Social History Narrative   Married 1977. Children- Cara 1979. [place] [id number]. 2 grandkids from [place]- Herlene 03-12-2004, Sydney 2006/03/12.       Retired programmer, multimedia    Objective  Objective:  BP 138/82 (BP Location: Left Arm, Patient Position: Sitting, Cuff Size: Normal)  Pulse 73   Temp 98.4 F (36.9 C) (Temporal)   Ht 5' 9 (1.753 m)   Wt 165 lb (74.8 kg)   SpO2 98%   BMI 24.37 kg/m  Gen: NAD, resting comfortably HEENT: Mucous membranes are moist. Oropharynx normal Neck: no thyromegaly CV: RRR no murmurs rubs or gallops Lungs: CTAB no crackles, wheeze, rhonchi Abdomen: soft/nontender/nondistended/normal bowel sounds. No rebound or guarding.  Ext: no edema Skin: warm, dry Neuro: grossly normal, moves all extremities, PERRLA   Assessment and Plan  73 y.o. male presenting for annual physical.  Health Maintenance counseling: 1. Anticipatory guidance: Patient counseled regarding regular dental exams -q6 months, eye exams -yearly,  avoiding smoking and second hand smoke- doesn't drink , limiting alcohol to 2 beverages per day - no smoking, no illicit drugs .   2. Risk factor reduction:  Advised patient of need for  regular exercise and diet rich and fruits and vegetables to reduce risk of heart attack and stroke.  Exercise- stays active but no regular intentional exercise- at least active.  Diet/weight management-weight largely stable.  Wt Readings from Last 3 Encounters:  02/10/24 165 lb (74.8 kg)  02/10/24 169 lb (76.7 kg)  01/13/24 169 lb 3.2 oz (76.7 kg)   3. Immunizations/screenings/ancillary studies- holding off on COVID and RSV otherwise up to date  Immunization History  Administered Date(s) Administered   Fluad Quad(high Dose 65+) 12/11/2018, 12/29/2019, 01/04/2022   INFLUENZA, HIGH DOSE SEASONAL PF 02/09/2016, 12/25/2016, 12/20/2017, 12/30/2020, 01/07/2023, 12/31/2023   Influenza Split 02/02/2011, 01/15/2012, 12/20/2017   Influenza Whole 12/20/2007, 01/27/2009, 03/01/2010   Influenza,inj,Quad PF,6+ Mos 12/26/2012, 12/31/2013, 01/03/2015   PFIZER(Purple Top)SARS-COV-2 Vaccination 05/01/2019, 05/22/2019, 01/18/2020   Pfizer Covid-19 Vaccine Bivalent Booster 1yrs & up 04/29/2021   Pneumococcal Conjugate-13 02/08/2015   Pneumococcal Polysaccharide-23 12/10/2013   Td 03/26/2000   Tdap 10/10/2011, 08/22/2018   Zoster Recombinant(Shingrix) 08/02/2017, 10/19/2017   Zoster, Live 03/01/2010   4. Prostate cancer screening- prefers to monitor with labs- ongoing nocturia  Lab Results  Component Value Date   PSA 2.03 02/07/2023   PSA 2.97 02/09/2022   PSA 2.79 02/07/2021   5. Colon cancer screening - adenoma 2023 Dr.. stark but no repeat due to age. No melena or bright red blood per rectum  6. Skin cancer screening- sees Dr. Shona. advised regular sunscreen use. Denies worrisome, changing, or new skin lesions.  7. Smoking associated screening (lung cancer screening, AAA screen 65-75, UA)- never smoker 8. STD screening - only active with wife  Status of chronic or acute concerns   # Proteinuria S: UACR up to 49 November 2024 around the time he had a short-term increase in creatinine up to 3 was  later improvement by time 1.34 but UACR still later increased to 67 and we started valsartan .  His creatinine worsened from 1.36-2.42.  We stopped his valsartan  and kidney function improved within a month to 1.45.  He has continued to have foamy urine but creatinine further improved 4 weeks ago to 1.39.  Urine continue to show protein last visit with my teammate but UACR was not checked at that time  We also did renal artery duplex which was negative for renal artery stenosis on 10/07/2023  Does have history of NSAID use but continues to avoid at this point  A/P: Given significant fluctuations with creatinine in the past as well as microalbuminuria and even proteinuria based on UA offered referral to nephrology today- the bigger issue for me is the poor response to the valsartan   with near doubling in creatinine without renal artery stenosis- would just like nephrology insight on this.    #hypertension/CKD stage III S: medication:  atenolol  50mg - half tablet twice daily, amlodipine  2.5 mg--> off when we tried to start valsartan   Home readings #s: variable with most recent 136/98 but as high as 165 and as low as 111- usually trends down on repeat  GFR is typically-in the high 40's or 50s-49 at most recent check A/P: For hypertension- blood pressure running higher off amlodipine - we opted to restart the amlodipine  2.5 mg daily and continue the atenolol   -if blood pressure is below 110 in last 24 hours asked him to hold the amlodipine  For CKD stage III-improved on most recent check but see above discussion   #hyperlipidemia-LDL goal at least under 100.  Ideally under 70. S: Medication:Atorvastatin  20 mg  Lab Results  Component Value Date   CHOL 102 02/07/2023   HDL 31.80 (L) 02/07/2023   LDLCALC 57 02/07/2023   LDLDIRECT 79.0 09/11/2018   TRIG 68.0 02/07/2023   CHOLHDL 3 02/07/2023   A/P: Reasonable control last year-offered update today-continue current medication for now    #Insomnia S:  Medication: Seroquel  12.5 mg, diazepam  5 mg for sleep   Has also been treated for anxiety in the past with buspirone -actually worsened anxiety.  This was around time of loss of his mother and near death of father - he ended up dying April 2022 though A/P: Atypical regimen but reasonable control-continue current medication-fortunately no driving issues the next day or if dreams. Does get up once a night to urinate but falls back asleep some and usually late in night  Recommended follow up: Return in about 6 months (around 08/09/2024) for followup or sooner if needed.Schedule b4 you leave.  Lab/Order associations: fasting   ICD-10-CM   1. Preventative health care  Z00.00     2. Essential hypertension  I10 Comprehensive metabolic panel with GFR    CBC with Differential/Platelet    Lipid panel    Microalbumin / creatinine urine ratio    Ambulatory referral to Nephrology    3. Hyperlipidemia, unspecified hyperlipidemia type  E78.5 Comprehensive metabolic panel with GFR    CBC with Differential/Platelet    Lipid panel    4. Stage 3 chronic kidney disease, unspecified whether stage 3a or 3b CKD (HCC)  N18.30 Microalbumin / creatinine urine ratio    Ambulatory referral to Nephrology    5. B12 deficiency  E53.8 Vitamin B12    6. Proteinuria, unspecified type  R80.9 Ambulatory referral to Nephrology    7. Screening for prostate cancer  Z12.5 PSA, Medicare      Meds ordered this encounter  Medications   amLODipine  (NORVASC ) 2.5 MG tablet    Sig: Take 1 tablet (2.5 mg total) by mouth daily.    Dispense:  90 tablet    Refill:  3    Return precautions advised.  Garnette Lukes, MD

## 2024-02-10 NOTE — Patient Instructions (Addendum)
 blood pressure running higher off amlodipine - we opted to restart the amlodipine  2.5 mg daily and continue the atenolol   -if blood pressure is below 110 in last 24 hours asked him to hold the amlodipine   Please stop by lab before you go If you have mychart- we will send your results within 3 business days of us  receiving them.  If you do not have mychart- we will call you about results within 5 business days of us  receiving them.  *please also note that you will see labs on mychart as soon as they post. I will later go in and write notes on them- will say notes from Dr. Katrinka   Recommended follow up: Return in about 6 months (around 08/09/2024) for followup or sooner if needed.Schedule b4 you leave.

## 2024-02-10 NOTE — Progress Notes (Signed)
 Chief Complaint  Patient presents with   Medicare Wellness     Subjective:   Nathan Young is a 73 y.o. male who presents for a Medicare Annual Wellness Visit.  Allergies (verified) Patient has no known allergies.   History: Past Medical History:  Diagnosis Date   ANXIETY 11/07/2006   HYPERLIPIDEMIA 07/31/2007   HYPERTENSION 11/07/2006   OSTEOARTHRITIS 11/07/2006   Sleep apnea    hasn't used cpap for 10-12 years   Past Surgical History:  Procedure Laterality Date   CATARACT EXTRACTION     bilateral   COLONOSCOPY  04/09/2016   Dr.Stark-polyp   HERNIA REPAIR     2000-02-19 Dr Velinda Moats left groin; right Feb 19, 2011   KNEE ARTHROSCOPY Right 02/19/07   POLYPECTOMY     Family History  Problem Relation Age of Onset   Heart disease Mother        pacer; MI late 75s or early 60s, CABG   Hyperlipidemia Mother    Hypertension Mother    Pneumonia Father        55- didnt bounce back. died 02-18-21   Other Sister        MVC   Heart attack Brother        age 10, smoker   Colon cancer Neg Hx    Esophageal cancer Neg Hx    Stomach cancer Neg Hx    Rectal cancer Neg Hx    Colon polyps Neg Hx    Social History   Occupational History   Occupation: Retired   Tobacco Use   Smoking status: Never   Smokeless tobacco: Never  Vaping Use   Vaping status: Never Used  Substance and Sexual Activity   Alcohol use: No   Drug use: No   Sexual activity: Yes   Tobacco Counseling Counseling given: Not Answered  SDOH Screenings   Food Insecurity: No Food Insecurity (02/10/2024)  Housing: Unknown (02/10/2024)  Transportation Needs: No Transportation Needs (02/10/2024)  Utilities: Not At Risk (02/10/2024)  Depression (PHQ2-9): Low Risk  (02/10/2024)  Financial Resource Strain: Low Risk  (03/16/2022)  Physical Activity: Sufficiently Active (02/10/2024)  Social Connections: Moderately Integrated (02/10/2024)  Stress: No Stress Concern Present (03/16/2022)  Tobacco Use: Low Risk  (02/10/2024)   Health Literacy: Adequate Health Literacy (02/10/2024)   See flowsheets for full screening details  Depression Screen PHQ 2 & 9 Depression Scale- Over the past 2 weeks, how often have you been bothered by any of the following problems? Little interest or pleasure in doing things: 0 Feeling down, depressed, or hopeless (PHQ Adolescent also includes...irritable): 0 PHQ-2 Total Score: 0     Goals Addressed               This Visit's Progress     stay active (pt-stated)        Stay health and active       Visit info / Clinical Intake: Medicare Wellness Visit Type:: Subsequent Annual Wellness Visit Persons participating in visit:: patient Medicare Wellness Visit Mode:: Telephone If telephone:: video declined Because this visit was a virtual/telehealth visit:: pt reported vitals If Telephone or Video please confirm:: I connected with the patient using audio enabled telemedicine application and verified that I am speaking with the correct person using two identifiers Patient Location:: home Provider Location:: office Information given by:: patient Interpreter Needed?: No Pre-visit prep was completed: yes AWV questionnaire completed by patient prior to visit?: no Living arrangements:: lives with spouse/significant other Patient's Overall Health Status Rating: very  good Typical amount of pain: none Does pain affect daily life?: no Are you currently prescribed opioids?: no  Dietary Habits and Nutritional Risks How many meals a day?: 3 Eats fruit and vegetables daily?: (!) no (at times) Most meals are obtained by: preparing own meals; eating out Diabetic:: no  Functional Status Activities of Daily Living (to include ambulation/medication): Independent Ambulation: Independent with device- listed below Home Assistive Devices/Equipment: Eyeglasses Medication Administration: Independent Home Management: Independent Manage your own finances?: yes Primary transportation is:  driving Concerns about vision?: no *vision screening is required for WTM* Concerns about hearing?: no  Fall Screening Falls in the past year?: 0 Number of falls in past year: 0 Was there an injury with Fall?: 0 Fall Risk Category Calculator: 0 Patient Fall Risk Level: Low Fall Risk  Fall Risk Patient at Risk for Falls Due to: No Fall Risks Fall risk Follow up: Falls prevention discussed  Home and Transportation Safety: All rugs have non-skid backing?: yes All stairs or steps have railings?: N/A, no stairs Grab bars in the bathtub or shower?: yes Have non-skid surface in bathtub or shower?: yes Good home lighting?: yes Regular seat belt use?: yes Hospital stays in the last year:: no  Cognitive Assessment Difficulty concentrating, remembering, or making decisions? : no Will 6CIT or Mini Cog be Completed: no 6CIT or Mini Cog Declined: patient alert, oriented, able to answer questions appropriately and recall recent events  Advance Directives (For Healthcare) Does Patient Have a Medical Advance Directive?: No Would patient like information on creating a medical advance directive?: No - Patient declined  Reviewed/Updated  Reviewed/Updated: Reviewed All (Medical, Surgical, Family, Medications, Allergies, Care Teams, Patient Goals)        Objective:    Today's Vitals   02/10/24 0844  BP: 135/86  Pulse: 71  Weight: 169 lb (76.7 kg)  Height: 5' 9 (1.753 m)   Body mass index is 24.96 kg/m.  Current Medications (verified) Outpatient Encounter Medications as of 02/10/2024  Medication Sig   atenolol  (TENORMIN ) 50 MG tablet TAKE 0.5 TABLETS BY MOUTH 2 TIMES DAILY.   atorvastatin  (LIPITOR) 20 MG tablet TAKE 1 TABLET BY MOUTH EVERY DAY   diazepam  (VALIUM ) 5 MG tablet TAKE 1/2 TO 1 TABLET BY MOUTH EVERY DAY AS NEEDED FOR SLEEP   diclofenac  Sodium (VOLTAREN ) 1 % GEL Apply 4 g topically as needed.   QUEtiapine  (SEROQUEL ) 25 MG tablet TAKE 0.5 TABLETS (12.5 MG TOTAL) BY MOUTH  AT BEDTIME AS NEEDED.   [DISCONTINUED] valsartan  (DIOVAN ) 40 MG tablet Take 1 tablet (40 mg total) by mouth daily. (Patient not taking: Reported on 01/13/2024)   No facility-administered encounter medications on file as of 02/10/2024.   Hearing/Vision screen Hearing Screening - Comments:: Pt denies any hearing issues  Vision Screening - Comments:: Wears rx glasses - up to date with routine eye exams with [facility]  Immunizations and Health Maintenance Health Maintenance  Topic Date Due   COVID-19 Vaccine (5 - 2025-26 season) 11/25/2023   Hepatitis C Screening  03/22/2098 (Originally 11/03/1968)   Medicare Annual Wellness (AWV)  02/09/2025   DTaP/Tdap/Td (4 - Td or Tdap) 08/21/2028   Pneumococcal Vaccine: 50+ Years  Completed   Influenza Vaccine  Completed   Zoster Vaccines- Shingrix  Completed   Meningococcal B Vaccine  Aged Out   Colonoscopy  Discontinued        Assessment/Plan:  This is a routine wellness examination for Sa[place]ier.  Patient Care Team: Katrinka Garnette KIDD, MD as PCP -  General (Family Medicine) Regenia, Prentice Clack, MD as Consulting Physician (Ophthalmology)  I have personally reviewed and noted the following in the patient's chart:   Medical and social history Use of alcohol, tobacco or illicit drugs  Current medications and supplements including opioid prescriptions. Functional ability and status Nutritional status Physical activity Advanced directives List of other physicians Hospitalizations, surgeries, and ER visits in previous 12 months Vitals Screenings to include cognitive, depression, and falls Referrals and appointments  No orders of the defined types were placed in this encounter.  In addition, I have reviewed and discussed with patient certain preventive protocols, quality metrics, and best practice recommendations. A written personalized care plan for preventive services as well as general preventive health recommendations were provided to  patient.   Ellouise VEAR Haws, LPN   88/82/7974   Return in 1 year (on 02/09/2025).  After Visit Summary: (MyChart) Due to this being a telephonic visit, the after visit summary with patients personalized plan was offered to patient via MyChart   Nurse Notes: nothing Significant at this time

## 2024-02-12 ENCOUNTER — Other Ambulatory Visit: Payer: Self-pay | Admitting: Family Medicine

## 2024-02-12 NOTE — Telephone Encounter (Unsigned)
 Copied from CRM [id number]. Topic: Clinical - Medication Refill >> Feb 12, 2024  1:30 PM Emylou G wrote: Medication: amLODipine  (NORVASC ) 2.5 MG tablet  Has the patient contacted their pharmacy? No (Agent: If no, request that the patient contact the pharmacy for the refill. If patient does not wish to contact the pharmacy document the reason why and proceed with request.) (Agent: If yes, when and what did the pharmacy advise?)  This is the patient's preferred pharmacy:  [id number] [place], [place] - [address] [place] [address] Phone: [phone] Fax: [phone]  Is this the correct pharmacy for this prescription? Yes If no, delete pharmacy and type the correct one.   Has the prescription been filled recently? No  Is the patient out of the medication? Yes  Has the patient been seen for an appointment in the last year OR does the patient have an upcoming appointment? Yes  Can we respond through MyChart? Yes  Agent: Please be advised that Rx refills may take up to 3 business days. We ask that you follow-up with your pharmacy.

## 2024-02-13 MED ORDER — AMLODIPINE BESYLATE 2.5 MG PO TABS: 2.5000 mg | ORAL_TABLET | Freq: Every day | ORAL | 3 refills | Status: AC

## 2024-02-19 ENCOUNTER — Other Ambulatory Visit: Payer: Self-pay | Admitting: Family Medicine

## 2024-02-19 DIAGNOSIS — R972 Elevated prostate specific antigen [PSA]: Secondary | ICD-10-CM

## 2024-03-04 LAB — LAB REPORT - SCANNED
A1c: 5.5
Albumin, Urine POC: 45.2
Creatinine, POC: 114.8 mg/dL
EGFR: 54
HM HIV Screening: NEGATIVE
HM Hepatitis Screen: NEGATIVE
Microalb Creat Ratio: 39

## 2024-03-10 ENCOUNTER — Other Ambulatory Visit: Payer: Self-pay | Admitting: Family Medicine

## 2024-03-27 ENCOUNTER — Encounter: Payer: Self-pay | Admitting: Oncology

## 2024-03-31 ENCOUNTER — Other Ambulatory Visit: Payer: Self-pay | Admitting: Family Medicine

## 2024-04-01 ENCOUNTER — Inpatient Hospital Stay: Attending: Oncology | Admitting: Oncology

## 2024-04-01 ENCOUNTER — Encounter: Payer: Self-pay | Admitting: Oncology

## 2024-04-01 ENCOUNTER — Inpatient Hospital Stay

## 2024-04-01 VITALS — BP 134/85 | HR 81 | Temp 97.9°F | Resp 18 | Ht 69.0 in | Wt 166.7 lb

## 2024-04-01 DIAGNOSIS — D472 Monoclonal gammopathy: Secondary | ICD-10-CM | POA: Diagnosis not present

## 2024-04-01 DIAGNOSIS — I129 Hypertensive chronic kidney disease with stage 1 through stage 4 chronic kidney disease, or unspecified chronic kidney disease: Secondary | ICD-10-CM | POA: Insufficient documentation

## 2024-04-01 DIAGNOSIS — Z79899 Other long term (current) drug therapy: Secondary | ICD-10-CM | POA: Diagnosis not present

## 2024-04-01 DIAGNOSIS — N182 Chronic kidney disease, stage 2 (mild): Secondary | ICD-10-CM | POA: Insufficient documentation

## 2024-04-01 LAB — CBC WITH DIFFERENTIAL/PLATELET
Abs Immature Granulocytes: 0.03 K/uL (ref 0.00–0.07)
Basophils Absolute: 0.1 K/uL (ref 0.0–0.1)
Basophils Relative: 1 %
Eosinophils Absolute: 0 K/uL (ref 0.0–0.5)
Eosinophils Relative: 0 %
HCT: 42.3 % (ref 39.0–52.0)
Hemoglobin: 14.7 g/dL (ref 13.0–17.0)
Immature Granulocytes: 0 %
Lymphocytes Relative: 13 %
Lymphs Abs: 1 K/uL (ref 0.7–4.0)
MCH: 33.5 pg (ref 26.0–34.0)
MCHC: 34.8 g/dL (ref 30.0–36.0)
MCV: 96.4 fL (ref 80.0–100.0)
Monocytes Absolute: 0.5 K/uL (ref 0.1–1.0)
Monocytes Relative: 6 %
Neutro Abs: 6.3 K/uL (ref 1.7–7.7)
Neutrophils Relative %: 80 %
Platelets: 248 K/uL (ref 150–400)
RBC: 4.39 MIL/uL (ref 4.22–5.81)
RDW: 12.6 % (ref 11.5–15.5)
WBC: 8 K/uL (ref 4.0–10.5)
nRBC: 0 % (ref 0.0–0.2)

## 2024-04-01 LAB — BASIC METABOLIC PANEL - CANCER CENTER ONLY
Anion gap: 12 (ref 5–15)
BUN: 14 mg/dL (ref 8–23)
CO2: 24 mmol/L (ref 22–32)
Calcium: 9.9 mg/dL (ref 8.9–10.3)
Chloride: 103 mmol/L (ref 98–111)
Creatinine: 1.37 mg/dL — ABNORMAL HIGH (ref 0.61–1.24)
GFR, Estimated: 54 mL/min — ABNORMAL LOW
Glucose, Bld: 102 mg/dL — ABNORMAL HIGH (ref 70–99)
Potassium: 4.6 mmol/L (ref 3.5–5.1)
Sodium: 139 mmol/L (ref 135–145)

## 2024-04-01 NOTE — Progress Notes (Signed)
 " [facility]  [phone] [phone]  ID: Nathan Young OB: 06-Sep-1950  MR#: [id number]  RDW#:[id number]  Patient Care Team: Katrinka Garnette KIDD, MD as PCP - General (Family Medicine) Regenia, Prentice Clack, MD as Consulting Physician (Ophthalmology)  CHIEF COMPLAINT: MGUS  INTERVAL HISTORY: Patient is a 74 year old male who was noted to have an elevated M spike on workup for proteinuria.  He is referred for further evaluation.  He is anxious, but otherwise feels well.  He has no neurologic complaints.  He denies any recent fevers or illnesses.  He has a good appetite and denies weight loss.  He denies any pain.  He has no chest pain, shortness of breath, cough, or hemoptysis.  He denies any nausea, vomiting, constipation, or diarrhea.  He has no urinary complaints.  Patient offers no specific complaints today.  REVIEW OF SYSTEMS:   Review of Systems  Constitutional: Negative.  Negative for fever, malaise/fatigue and weight loss.  Respiratory: Negative.  Negative for cough, hemoptysis and shortness of breath.   Cardiovascular: Negative.  Negative for chest pain and leg swelling.  Gastrointestinal: Negative.  Negative for abdominal pain.  Genitourinary: Negative.  Negative for dysuria.  Musculoskeletal: Negative.  Negative for back pain.  Skin: Negative.  Negative for rash.  Neurological: Negative.  Negative for dizziness, focal weakness, weakness and headaches.  Psychiatric/Behavioral:  The patient is nervous/anxious.     As per HPI. Otherwise, a complete review of systems is negative.  PAST MEDICAL HISTORY: Past Medical History:  Diagnosis Date   ANXIETY 11/07/2006   HYPERLIPIDEMIA 07/31/2007   HYPERTENSION 11/07/2006   OSTEOARTHRITIS 11/07/2006   Sleep apnea    hasn't used cpap for 10-12 years    PAST SURGICAL HISTORY: Past Surgical History:  Procedure Laterality Date   CATARACT EXTRACTION     bilateral   COLONOSCOPY  04/09/2016    Dr.Stark-polyp   HERNIA REPAIR     April 17, 1999 Dr Velinda Moats left groin; right 2010/04/16   KNEE ARTHROSCOPY Right April 16, 2006   POLYPECTOMY      FAMILY HISTORY: Family History  Problem Relation Age of Onset   Heart disease Mother        pacer; MI late 60s or early 15s, CABG   Hyperlipidemia Mother    Hypertension Mother    Pneumonia Father        65- didnt bounce back. died 04/16/20   Other Sister        MVC   Heart attack Brother        age 51, smoker   Colon cancer Neg Hx    Esophageal cancer Neg Hx    Stomach cancer Neg Hx    Rectal cancer Neg Hx    Colon polyps Neg Hx     ADVANCED DIRECTIVES (Y/N):  N  HEALTH MAINTENANCE: Social History[1]   Colonoscopy:  PAP:  Bone density:  Lipid panel:  Allergies[2]  Current Outpatient Medications  Medication Sig Dispense Refill   amLODipine  (NORVASC ) 2.5 MG tablet Take 1 tablet (2.5 mg total) by mouth daily. 90 tablet 3   atenolol  (TENORMIN ) 50 MG tablet TAKE 0.5 TABLETS BY MOUTH 2 TIMES DAILY. 180 tablet 3   atorvastatin  (LIPITOR) 20 MG tablet TAKE 1 TABLET BY MOUTH EVERY DAY 90 tablet 3   Cranberry-Vitamin C-Probiotic (AZO CRANBERRY) 250-30 MG TABS Take 1 tablet by mouth daily.     diazepam  (VALIUM ) 5 MG tablet TAKE 1/2 TO 1 TABLET BY MOUTH EVERY DAY AS NEEDED FOR SLEEP  30 tablet 5   QUEtiapine  (SEROQUEL ) 25 MG tablet TAKE 0.5 TABLETS (12.5 MG TOTAL) BY MOUTH AT BEDTIME AS NEEDED. 45 tablet 3   diclofenac  Sodium (VOLTAREN ) 1 % GEL Apply 4 g topically as needed. (Patient not taking: Reported on 04/01/2024) 150 g 3   No current facility-administered medications for this visit.    OBJECTIVE: Vitals:   04/01/24 1139 04/01/24 1143  BP: (!) 139/90 134/85  Pulse: 81   Resp: 18   Temp: 97.9 F (36.6 C)   SpO2: 100%      Body mass index is 24.62 kg/m.    ECOG FS:0 - Asymptomatic  General: Well-developed, well-nourished, no acute distress. Eyes: Pink conjunctiva, anicteric sclera. HEENT: Normocephalic, moist mucous membranes. Lungs: No  audible wheezing or coughing. Heart: Regular rate and rhythm. Abdomen: Soft, nontender, no obvious distention. Musculoskeletal: No edema, cyanosis, or clubbing. Neuro: Alert, answering all questions appropriately. Cranial nerves grossly intact. Skin: No rashes or petechiae noted. Psych: Normal affect. Lymphatics: No cervical, calvicular, axillary or inguinal LAD.   LAB RESULTS:  Lab Results  Component Value Date   NA 139 04/01/2024   K 4.6 04/01/2024   CL 103 04/01/2024   CO2 24 04/01/2024   GLUCOSE 102 (H) 04/01/2024   BUN 14 04/01/2024   CREATININE 1.37 (H) 04/01/2024   CALCIUM  9.9 04/01/2024   PROT 6.9 02/10/2024   ALBUMIN 4.4 02/10/2024   AST 18 02/10/2024   ALT 16 02/10/2024   ALKPHOS 46 02/10/2024   BILITOT 0.9 02/10/2024   GFRNONAA 54 (L) 04/01/2024   GFRAA 60 02/01/2020    Lab Results  Component Value Date   WBC 8.0 04/01/2024   NEUTROABS 6.3 04/01/2024   HGB 14.7 04/01/2024   HCT 42.3 04/01/2024   MCV 96.4 04/01/2024   PLT 248 04/01/2024     STUDIES: No results found.  ASSESSMENT: MGUS.  PLAN:    MGUS: Patient was noted to have an M spike of 0.9.  Immunoglobulins were within normal limits.  He has a mild chronic renal insufficiency, but no other evidence of endorgan damage.  Repeat laboratory work from today is pending at time of dictation.  No intervention is needed.  Patient does not require bone marrow biopsy or imaging.  Return to clinic in 3 months with repeat laboratory work and further evaluation. Chronic renal insufficiency: Continue follow-up and treatment with with nephrology as indicated.  I spent a total of 45 minutes reviewing chart data, face-to-face evaluation with the patient, counseling and coordination of care as detailed above.   Patient expressed understanding and was in agreement with this plan. He also understands that He can call clinic at any time with any questions, concerns, or complaints.    Evalene JINNY Reusing, MD    04/01/2024 1:14 PM         [1]  Social History Tobacco Use   Smoking status: Never   Smokeless tobacco: Never  Vaping Use   Vaping status: Never Used  Substance Use Topics   Alcohol use: No   Drug use: No  [2] No Known Allergies  "

## 2024-04-01 NOTE — Progress Notes (Signed)
Patient feels fine

## 2024-04-02 LAB — PROTEIN ELECTROPHORESIS, SERUM
A/G Ratio: 1.3 (ref 0.7–1.7)
Albumin ELP: 4 g/dL (ref 2.9–4.4)
Alpha-1-Globulin: 0.3 g/dL (ref 0.0–0.4)
Alpha-2-Globulin: 0.7 g/dL (ref 0.4–1.0)
Beta Globulin: 1.5 g/dL — ABNORMAL HIGH (ref 0.7–1.3)
Gamma Globulin: 0.6 g/dL (ref 0.4–1.8)
Globulin, Total: 3 g/dL (ref 2.2–3.9)
M-Spike, %: 0.9 g/dL — ABNORMAL HIGH
Total Protein ELP: 7 g/dL (ref 6.0–8.5)

## 2024-04-02 LAB — IGG, IGA, IGM
IgA: 30 mg/dL — ABNORMAL LOW (ref 61–437)
IgG (Immunoglobin G), Serum: 673 mg/dL (ref 603–1613)
IgM (Immunoglobulin M), Srm: 16 mg/dL (ref 15–143)

## 2024-04-02 LAB — KAPPA/LAMBDA LIGHT CHAINS
Kappa free light chain: 16.6 mg/L (ref 3.3–19.4)
Kappa, lambda light chain ratio: 0 — ABNORMAL LOW (ref 0.26–1.65)

## 2024-04-14 ENCOUNTER — Other Ambulatory Visit: Payer: Self-pay | Admitting: Family Medicine

## 2024-07-07 ENCOUNTER — Inpatient Hospital Stay

## 2024-07-14 ENCOUNTER — Inpatient Hospital Stay: Admitting: Oncology

## 2024-08-10 ENCOUNTER — Ambulatory Visit: Admitting: Family Medicine

## 2025-02-11 ENCOUNTER — Encounter: Admitting: Family Medicine
